# Patient Record
Sex: Female | Born: 1945
Health system: Southern US, Community
[De-identification: ages and names within clinical notes are randomized; demographics above are authoritative.]

## PROBLEM LIST (undated history)

## (undated) DIAGNOSIS — J189 Pneumonia, unspecified organism: Secondary | ICD-10-CM

## (undated) DIAGNOSIS — R011 Cardiac murmur, unspecified: Secondary | ICD-10-CM

## (undated) DIAGNOSIS — K219 Gastro-esophageal reflux disease without esophagitis: Secondary | ICD-10-CM

## (undated) HISTORY — DX: Gastro-esophageal reflux disease without esophagitis: K21.9

## (undated) HISTORY — PX: CATARACT EXTRACTION W/ INTRAOCULAR LENS  IMPLANT, BILATERAL: SHX1307

## (undated) HISTORY — PX: TUBAL LIGATION: SHX77

## (undated) HISTORY — DX: Cardiac murmur, unspecified: R01.1

## (undated) HISTORY — PX: TONSILLECTOMY: SUR1361

---

## 1997-09-16 ENCOUNTER — Inpatient Hospital Stay (HOSPITAL_COMMUNITY): Admission: EM | Admit: 1997-09-16 | Discharge: 1997-09-17 | Payer: Self-pay | Admitting: Emergency Medicine

## 1997-09-22 ENCOUNTER — Encounter: Admission: RE | Admit: 1997-09-22 | Discharge: 1997-09-22 | Payer: Self-pay | Admitting: Family Medicine

## 1997-12-04 ENCOUNTER — Encounter: Admission: RE | Admit: 1997-12-04 | Discharge: 1997-12-04 | Payer: Self-pay | Admitting: Family Medicine

## 1998-01-20 ENCOUNTER — Encounter: Admission: RE | Admit: 1998-01-20 | Discharge: 1998-01-20 | Payer: Self-pay | Admitting: Family Medicine

## 1998-09-07 ENCOUNTER — Encounter: Payer: Self-pay | Admitting: Emergency Medicine

## 1998-09-07 ENCOUNTER — Inpatient Hospital Stay (HOSPITAL_COMMUNITY): Admission: EM | Admit: 1998-09-07 | Discharge: 1998-09-09 | Payer: Self-pay | Admitting: Emergency Medicine

## 1998-09-22 ENCOUNTER — Encounter: Admission: RE | Admit: 1998-09-22 | Discharge: 1998-09-22 | Payer: Self-pay | Admitting: Family Medicine

## 1998-10-22 ENCOUNTER — Ambulatory Visit (HOSPITAL_COMMUNITY): Admission: RE | Admit: 1998-10-22 | Discharge: 1998-10-22 | Payer: Self-pay | Admitting: Sports Medicine

## 1998-11-26 ENCOUNTER — Encounter: Admission: RE | Admit: 1998-11-26 | Discharge: 1998-11-26 | Payer: Self-pay | Admitting: Family Medicine

## 1998-12-03 ENCOUNTER — Encounter: Admission: RE | Admit: 1998-12-03 | Discharge: 1998-12-03 | Payer: Self-pay | Admitting: Family Medicine

## 1998-12-22 ENCOUNTER — Encounter: Admission: RE | Admit: 1998-12-22 | Discharge: 1998-12-22 | Payer: Self-pay | Admitting: Family Medicine

## 1999-01-25 ENCOUNTER — Encounter: Admission: RE | Admit: 1999-01-25 | Discharge: 1999-01-25 | Payer: Self-pay | Admitting: Family Medicine

## 1999-02-10 ENCOUNTER — Encounter: Admission: RE | Admit: 1999-02-10 | Discharge: 1999-02-10 | Payer: Self-pay | Admitting: Family Medicine

## 1999-03-07 ENCOUNTER — Encounter: Admission: RE | Admit: 1999-03-07 | Discharge: 1999-03-07 | Payer: Self-pay | Admitting: Family Medicine

## 1999-03-10 ENCOUNTER — Encounter: Admission: RE | Admit: 1999-03-10 | Discharge: 1999-03-10 | Payer: Self-pay | Admitting: Sports Medicine

## 1999-03-10 ENCOUNTER — Encounter: Payer: Self-pay | Admitting: Sports Medicine

## 1999-04-05 ENCOUNTER — Encounter: Admission: RE | Admit: 1999-04-05 | Discharge: 1999-04-05 | Payer: Self-pay | Admitting: Sports Medicine

## 1999-09-02 ENCOUNTER — Encounter: Admission: RE | Admit: 1999-09-02 | Discharge: 1999-09-02 | Payer: Self-pay | Admitting: Family Medicine

## 1999-09-03 ENCOUNTER — Emergency Department (HOSPITAL_COMMUNITY): Admission: EM | Admit: 1999-09-03 | Discharge: 1999-09-03 | Payer: Self-pay | Admitting: Emergency Medicine

## 1999-09-03 ENCOUNTER — Encounter: Payer: Self-pay | Admitting: Emergency Medicine

## 1999-09-12 ENCOUNTER — Encounter: Admission: RE | Admit: 1999-09-12 | Discharge: 1999-09-12 | Payer: Self-pay | Admitting: Family Medicine

## 2001-02-22 ENCOUNTER — Encounter: Admission: RE | Admit: 2001-02-22 | Discharge: 2001-02-22 | Payer: Self-pay | Admitting: Family Medicine

## 2001-02-22 ENCOUNTER — Inpatient Hospital Stay (HOSPITAL_COMMUNITY): Admission: EM | Admit: 2001-02-22 | Discharge: 2001-02-25 | Payer: Self-pay | Admitting: Emergency Medicine

## 2001-02-22 ENCOUNTER — Encounter: Payer: Self-pay | Admitting: Family Medicine

## 2001-09-12 ENCOUNTER — Emergency Department (HOSPITAL_COMMUNITY): Admission: EM | Admit: 2001-09-12 | Discharge: 2001-09-12 | Payer: Self-pay | Admitting: Emergency Medicine

## 2001-09-12 ENCOUNTER — Encounter: Payer: Self-pay | Admitting: Emergency Medicine

## 2001-09-13 ENCOUNTER — Encounter: Admission: RE | Admit: 2001-09-13 | Discharge: 2001-09-13 | Payer: Self-pay | Admitting: Family Medicine

## 2001-09-19 ENCOUNTER — Encounter: Payer: Self-pay | Admitting: *Deleted

## 2001-09-19 ENCOUNTER — Encounter: Admission: RE | Admit: 2001-09-19 | Discharge: 2001-09-19 | Payer: Self-pay | Admitting: *Deleted

## 2002-04-08 ENCOUNTER — Inpatient Hospital Stay (HOSPITAL_COMMUNITY): Admission: EM | Admit: 2002-04-08 | Discharge: 2002-04-11 | Payer: Self-pay | Admitting: Emergency Medicine

## 2002-04-08 ENCOUNTER — Encounter: Payer: Self-pay | Admitting: Emergency Medicine

## 2002-04-09 ENCOUNTER — Encounter: Payer: Self-pay | Admitting: Family Medicine

## 2002-04-10 ENCOUNTER — Encounter: Payer: Self-pay | Admitting: Family Medicine

## 2002-04-16 ENCOUNTER — Encounter: Admission: RE | Admit: 2002-04-16 | Discharge: 2002-04-16 | Payer: Self-pay | Admitting: Family Medicine

## 2002-11-11 ENCOUNTER — Emergency Department (HOSPITAL_COMMUNITY): Admission: AD | Admit: 2002-11-11 | Discharge: 2002-11-11 | Payer: Self-pay | Admitting: Emergency Medicine

## 2002-12-19 ENCOUNTER — Inpatient Hospital Stay (HOSPITAL_COMMUNITY): Admission: EM | Admit: 2002-12-19 | Discharge: 2002-12-24 | Payer: Self-pay | Admitting: Family Medicine

## 2002-12-29 ENCOUNTER — Encounter: Admission: RE | Admit: 2002-12-29 | Discharge: 2002-12-29 | Payer: Self-pay | Admitting: Family Medicine

## 2003-01-21 ENCOUNTER — Encounter (INDEPENDENT_AMBULATORY_CARE_PROVIDER_SITE_OTHER): Payer: Self-pay | Admitting: *Deleted

## 2003-01-21 LAB — CONVERTED CEMR LAB

## 2003-01-28 ENCOUNTER — Encounter: Admission: RE | Admit: 2003-01-28 | Discharge: 2003-01-28 | Payer: Self-pay | Admitting: Family Medicine

## 2004-03-19 ENCOUNTER — Inpatient Hospital Stay (HOSPITAL_COMMUNITY): Admission: EM | Admit: 2004-03-19 | Discharge: 2004-03-24 | Payer: Self-pay | Admitting: Emergency Medicine

## 2004-08-10 ENCOUNTER — Emergency Department (HOSPITAL_COMMUNITY): Admission: EM | Admit: 2004-08-10 | Discharge: 2004-08-10 | Payer: Self-pay | Admitting: Emergency Medicine

## 2004-09-20 ENCOUNTER — Inpatient Hospital Stay (HOSPITAL_COMMUNITY): Admission: EM | Admit: 2004-09-20 | Discharge: 2004-09-26 | Payer: Self-pay | Admitting: Emergency Medicine

## 2004-09-22 ENCOUNTER — Ambulatory Visit: Payer: Self-pay | Admitting: Pulmonary Disease

## 2004-09-27 ENCOUNTER — Ambulatory Visit: Payer: Self-pay | Admitting: Internal Medicine

## 2004-09-29 ENCOUNTER — Ambulatory Visit: Payer: Self-pay | Admitting: Internal Medicine

## 2004-10-06 ENCOUNTER — Ambulatory Visit: Payer: Self-pay | Admitting: Gastroenterology

## 2004-10-19 ENCOUNTER — Encounter (INDEPENDENT_AMBULATORY_CARE_PROVIDER_SITE_OTHER): Payer: Self-pay | Admitting: *Deleted

## 2004-10-19 ENCOUNTER — Ambulatory Visit: Payer: Self-pay | Admitting: Gastroenterology

## 2006-04-19 DIAGNOSIS — J309 Allergic rhinitis, unspecified: Secondary | ICD-10-CM | POA: Insufficient documentation

## 2006-04-19 DIAGNOSIS — J45909 Unspecified asthma, uncomplicated: Secondary | ICD-10-CM | POA: Insufficient documentation

## 2006-04-20 ENCOUNTER — Encounter (INDEPENDENT_AMBULATORY_CARE_PROVIDER_SITE_OTHER): Payer: Self-pay | Admitting: *Deleted

## 2007-04-02 ENCOUNTER — Inpatient Hospital Stay (HOSPITAL_COMMUNITY): Admission: EM | Admit: 2007-04-02 | Discharge: 2007-04-04 | Payer: Self-pay | Admitting: Emergency Medicine

## 2007-05-30 ENCOUNTER — Ambulatory Visit (HOSPITAL_COMMUNITY): Admission: RE | Admit: 2007-05-30 | Discharge: 2007-05-30 | Payer: Self-pay | Admitting: Ophthalmology

## 2009-06-18 ENCOUNTER — Emergency Department (HOSPITAL_COMMUNITY): Admission: EM | Admit: 2009-06-18 | Discharge: 2009-06-18 | Payer: Self-pay | Admitting: Emergency Medicine

## 2009-10-01 ENCOUNTER — Encounter (INDEPENDENT_AMBULATORY_CARE_PROVIDER_SITE_OTHER): Payer: Self-pay | Admitting: *Deleted

## 2010-03-24 NOTE — Letter (Signed)
Summary: Colonoscopy Letter  [facility]  [address] [place], [place] [address]   Phone: [phone]  Fax: [phone]      October 01, 2009 MRN: [id number]   [facility] [address] [place], [place]  [address]   Dear Ms. DANIELS,   According to your medical record, it is time for you to schedule a Colonoscopy. The American Cancer Society recommends this procedure as a method to detect early colon cancer. Patients with a family history of colon cancer, or a personal history of colon polyps or inflammatory bowel disease are at increased risk.  This letter has beeen generated based on the recommendations made at the time of your procedure. If you feel that in your particular situation this may no longer apply, please contact our office.  Please call our office at [phone] to schedule this appointment or to update your records at your earliest convenience.  Thank you for cooperating with [place] to provide you with the very best care possible.   Sincerely,  Judie Petit T. Russella Dar, M.D.  [facility] [phone]

## 2010-07-05 NOTE — H&P (Signed)
NAME:  [name], [name]             ACCOUNT NO.:  [id number]   MEDICAL RECORD NO.:  [id number]          PATIENT TYPE:  INP   LOCATION:  1826                         FACILITY:  [facility]   PHYSICIAN:  Mobolaji B. Bakare, M.D.DATE OF BIRTH:  04/12/1945   DATE OF ADMISSION:  04/01/2007  DATE OF DISCHARGE:                              HISTORY & PHYSICAL   CHIEF COMPLAINT:  Shortness of breath and cough.   HISTORY OF PRESENT ILLNESS:  Ms. [name] is a 65 year old, African  American female with known history of asthma.  She has been on Advair  and albuterol.  She was in her usual state of health until about 2 days  ago when she started feeling unwell.  It initially started with feeling  of upper respiratory congestion.  She has history of contact with a son  who had flu-like symptoms.  The patient became quite unwell today with  persistent cough and wheezing.  Cough is productive of yellow sputum.  She was brought to the emergency room in severe bronchospasm and was  speaking in short phrases.  She received multiple rounds of nebulizers  and she continues to wheeze.  The patient is now feeling better, no  longer acutely dyspneic.  She had a chest x-ray which revealed probable  left lower lobe pneumonia.  She has received Solu-Medrol, albuterol and  Atrovent nebulizers and magnesium sulfate in the emergency room.  Antibiotics given include Rocephin 1 g IV.  She will be admitted for  further treatment.   REVIEW OF SYSTEMS:  She endorses fever, but no chills.  There is no  chest pain, nausea, vomiting or diarrhea.  She has no constipation,  dysuria or foul-smelling urine.   PAST MEDICAL HISTORY:  1. Status asthmaticus requiring mechanical ventilation in August 2006.  2. History of microcytic anemia.   CURRENT MEDICATIONS:  1. Albuterol nebulizers p.r.n.  2. Advair Diskus 1 puff daily.   ALLERGIES:  ASPIRIN.   SOCIAL HISTORY:  She does not smoke cigarettes or drink alcohol.  She  denies  drug abuse.  She works with the homeless.   FAMILY HISTORY:  Positive for asthma in one of her sisters.  Both  parents are alive.  They have high blood pressure.   PHYSICAL EXAMINATION:  VITAL SIGNS:  Temperature 99.9, blood pressure  166/81, pulse 68, respirations 28, O2 saturations 92%.  GENERAL:  The patient is comfortable and not in acute respiratory  distress.  HEENT:  Normocephalic, atraumatic.  Pupils equal round and reactive to  light.  Mucous membranes moist.  No elevated JVD.  LUNGS:  Bilateral respiratory wheeze with left basilar crackles.  CARDIAC:  S1, S2 regular.  ABDOMEN:  Nondistended, soft, nontender.  Bowel sounds present.  EXTREMITIES:  No pedal edema or calf tenderness.  Dorsalis pedis pulses  palpable bilaterally.  NEUROLOGIC:  No focal neurological deficits.   LABORATORY DATA AND X-RAY FINDINGS:  Blood gas with pH 7.42, pCO2 43,  pO2 76, bicarb 29, O2 saturations 95%.  BNP 40.  Sodium 141, potassium  2.8, chloride 106, CO2 26, glucose 145, BUN 6.  LFTs normal except for  albumin of 3.4.  White cell count 11.1, hemoglobin 11.3, hematocrit  34.8, MCV 78.3, platelets 229.   Chest x-ray shows strictly adherence in left lower lobe concerning for  pneumonia.  Lateral view recommended.   ASSESSMENT/PLAN:  Ms. [name] is a 65 year old, African American female  with a history of asthma presenting with acute onset of shortness of  breath and wheezing, low-grade fever, mild leukocytosis.  Chest x-ray  suggestive of left lower lobe pneumonia.  She has history of sick  contact.   1. Acute asthma exacerbation.  The patient has history of mechanical      ventilation in 2006.  She will be admitted to a step-down unit.      Will continue nebulization with Xopenex q.6 h. and q.3 h. p.r.n.,      Solu-Medrol 60 mg IV q.6 h.  We added Singulair 10 mg p.o. daily.  2. Left lower lobe pneumonia.  Will attempt blood culture and continue      Rocephin 1 g IV daily, Zithromax 500 mg  IV daily.  Will follow up      with lateral view of the chest.  3. Hypokalemia.  Will replete potassium chloride 40 mEq x2 doses and      follow up with BMET.  4. Microcytic anemia.  Mean corpuscular volume is on the low side of      normal at 78.7.  Will check anemia panel and stool Hemoccult.      Mobolaji B. Corky Downs, M.D.  Electronically Signed     MBB/MEDQ  D:  04/02/2007  T:  04/02/2007  Job:  [id number]

## 2010-07-05 NOTE — Discharge Summary (Signed)
[name], BURNSWORTH             ACCOUNT NO.:  [id number]   MEDICAL RECORD NO.:  [id number]          PATIENT TYPE:  INP   LOCATION:  3028                         FACILITY:  [facility]   PHYSICIAN:  Beckey Rutter, MD  DATE OF BIRTH:  Nov 04, 1945   DATE OF ADMISSION:  04/01/2007  DATE OF DISCHARGE:  04/04/2007                               DISCHARGE SUMMARY   CHIEF COMPLAINT/HISTORY OF PRESENT ILLNESS:  This 65 year old pleasant  African American female admitted for shortness of breath.   HOSPITAL COURSE:  #1 - ASTHMA.  The patient was continued on nebulizer  medication and steroid.   #2 - LEFT LOWER LOBE PNEUMONIA.  This was treated with Zithromax and  Rocephin with good response.   #3 - HYPOKALEMIA, REPLETED.  The patient's potassium now is stable.   #4 - MICROCYTIC ANEMIA WITH UNYIELDING ANEMIA WORKUP.   POSTURAL PROCEDURES:  Chest x-ray on April 01, 2007, impression is  increased streaking lower lobe opacities when compared to prior exams,  left greater than right.  Left lower lobe bronchopneumonia cannot be  excluded.  Lateral view might be helpful for further evaluation.   DISCHARGE LABORATORIES:  Sodium 141, potassium 4.4, chloride 105, CO2  30, glucose 121, BUN is 14, creatinine 0.80, white blood count is 10.0,  hemoglobin is 10.7, hematocrit 32.7, platelet count is 238, T3 is 2.1,  T4 is 0.99.  TSH is 0.12.   DISCHARGE DIAGNOSES:  1. Left lower lobe pneumonia.  2. Bronchial asthma with acute exacerbation.  3. Iron deficiency anemia.   DISCHARGE MEDICATIONS:  1. NuIron.  2. Advair 250/50 one puff b.i.d. three.  3. Xopenex t.i.d. p.r.n.  4. Tapering dose of steroid prednisone.  5. Avelox 400 mg p.o. daily.   DISCHARGE/PLAN:  The patient will be discharged to follow up with her  primary physician within a week.  Her primary physician is Dr. Yetta Barre.  I  am not sure if he has a fax number on the record, but the patient is  also aware of the need to follow-up her  thyroid function test as well as  the anemia workup.  The patient agreed to follow up with a primary  within a week.      Beckey Rutter, MD  Electronically Signed     EME/MEDQ  D:  04/04/2007  T:  04/05/2007  Job:  [id number]

## 2010-07-08 NOTE — H&P (Signed)
NAME:  [name], [name]                       ACCOUNT NO.:  [id number]   MEDICAL RECORD NO.:  [id number]                   PATIENT TYPE:  INP   LOCATION:  1824                                 FACILITY:  [facility]   PHYSICIAN:  Santiago Bumpers. Hensel, M.D.             DATE OF BIRTH:  Jul 12, 1945   DATE OF ADMISSION:  12/19/2002  DATE OF DISCHARGE:                                HISTORY & PHYSICAL   PRIMARY CARE PHYSICIAN:  Georgina Peer, M.D.   CHIEF COMPLAINT:  Asthma exacerbation.   HISTORY OF PRESENT ILLNESS:  [name] is a 65 year old African-American  female patient who has had a history of two previous intubations secondary  to severe asthma.  She apparently started having some asthma exacerbation  symptoms yesterday, but refused transport to the doctor's office at that  time.  At some point in the early morning this morning her son recognized  that she was having difficulty and went to her house and called EMS.  Upon  their arrival she was tripod sitting, short of breath, and unable to say  complete sentences.  She was transported by EMS and en route was given one  albuterol and Atrovent nebulizer and became apneic at which time EMS  intubated.  Upon arrival in the ER patient was sedated with a Versed drip.  She did have an episode of hypotension and was given a 600 mL bolus and  placed in Trendelenburg position with a subsequent resumption of  normotension at 109/47.  She also was given a 2 g magnesium bolus over 45  minutes and Decadron 20 mg IV.  History is somewhat poorly obtained as the  patient is not accompanied by her son to the ER.  We do have report that she  denies sick contacts or URI symptoms.   PAST MEDICAL HISTORY:  1. Allergic rhinitis.  2. Asthma.  3. HRT.  4. Intubation secondary to VDRF in January 1995 and 2004.  5. Exercise tolerance testing in September 2000 shows indeterminate risk.   MEDICATIONS:  1. Advair Diskus one puff b.i.d.  2. Albuterol  and Atrovent MDIs per routine.  3. Singulair 10 mg p.o. q.h.s.   There is concern that patient may be noncompliant with her medication  therapy.   ALLERGIES:  ASPIRIN.   SOCIAL HISTORY:  The patient may live with a young son, although we do know  that she has at least one son who is an adult.  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  Denies alcohol, tobacco, or drugs.   FAMILY HISTORY:  No history of breast cancer, CAD, CVAs.  She does have a  mother with hypertension and a sister with asthma.   PHYSICAL EXAMINATION:  VITAL SIGNS:  Afebrile.  Pulse 110, blood pressure  121/58.  GENERAL:  Intubated and sedated.  HEENT:  PERRLA, but sluggish response as patient is on Versed.  NECK:  Supple.  No lymphadenopathy.  CARDIOVASCULAR:  Tachycardia.  No murmur.  Regular rhythm.  LUNGS:  Prolonged end-expiratory phase with wheeze and fairly tight air  movement.  ABDOMEN:  Soft, nontender.  SKIN:  No rashes or lesions noted.  NEUROLOGIC:  Unable due to sedation.   LABORATORIES:  WBC 11.1, hemoglobin 12.5, platelets 256,000.  Initial ABG pH  7.1, pCO2 92, pO2 520, bicarbonate 29 100%.  Respiratory rate was turned up  to 16, FiO2 turned down to 50% with a subsequent ABG of pH 7.2, pCO2 69, pO2  219, saturations 50%.  Sodium 142, potassium 3.5, chloride 106, CO2 32, BUN  7, glucose 169.  Chest x-ray shows no infiltrate or effusion, but  significant hyperinflation.   ASSESSMENT/PLAN:  A 65 year old female with ventilator dependent respiratory  failure secondary to acute asthma exacerbation.  1. Pulmonary.  Continue continuous albuterol nebulizers at this time and     q.6h. Atrovent nebulizers and begin Solu-Medrol 120 mg intravenous q.6h.     We will consult CCM for that management.  2. Infectious disease:  No evidence of infection on chest x-ray and patient     is afebrile.  We will hold antibiotics at this time.  3. FEN:  Decreased urine output and patient appears to be dehydrated.  Will      give another 500 mL bolus and start her on D5 half normal saline with 20     KCl per liter at 100 mL/hour.  4. Gastrointestinal:  Start patient on gastrointestinal prophylaxis with     Protonix 40 mg intravenous daily.  5. Deep venous thrombosis prophylaxis with Lovenox as per pharmacy.  6. Neurologic.  Continue Versed sedation protocol as initiated in the     emergency room.  7. Respiratory acidosis.  CO2 is trending down after increased respiratory     rate.  Continue current ventilator settings at this time and defer     management further to the intensive care unit team.  Will check an ABG     and XR in the a.m.      Jonah Blue, M.D.                      William A. Leveda Anna, M.D.    Milas Gain  D:  12/19/2002  T:  12/19/2002  Job:  [id number]   cc:   Georgina Peer, M.D.  [address] [place], [place] [address]  Fax: [phone]

## 2010-07-08 NOTE — H&P (Signed)
NAMERYLAND, SMOOTS NO.:  [id number]   MEDICAL RECORD NO.:  [id number]          PATIENT TYPE:  INP   LOCATION:  2109                         FACILITY:  [facility]   PHYSICIAN:  Sherin Quarry, MD      DATE OF BIRTH:  Apr 29, 1945   DATE OF ADMISSION:  09/20/2004  DATE OF DISCHARGE:                                HISTORY & PHYSICAL   [name] is a 65 year old lady who apparently developed acute onset  of wheezing tonight. EMS was summoned. In the EMS vehicle she was given  nebulizer treatments with Alupent and also intravenous steroids. On arrival  to the emergency room she was given SQ epinephrine but it quickly became  apparent that she was experiencing profound respiratory failure. She was  therefore intubated. Dr. Sherene Sires was consulted and the patient was given 2  grams of magnesium sulfate as well as Xopenex 1.25 mg per nebulization.  Subsequently Dr. Sherene Sires has written ventilator orders for the patient and  requested that she be admitted to the Hospitalist service.  [name]  cannot give any additional history at this time and no family members are  present. Therefore no other information can be obtained. On the basis of her  hospital records, past medical history is remarkable for:  Seasonal rhinitis. Chronic bronchial asthma which has required intubation in  1995 and 2000.  According to the records the patient's past medications have included:  1.  Advair Diskus 1 puff b.i.d.  2.  Albuterol by nebulization.  3.  Singulair 10 mg h.s.  (Although I do not know if she is actually taking      these medications at this time).  According to the family practice residents she was last seen in their clinic  2 years ago.   ALLERGIES:  ASPIRIN.   FAMILY HISTORY:  Cannot be obtained.   SOCIAL HISTORY:  The patient is reportedly a non smoker. She does not abuse  drugs. It is indicated in the chart that she has a sister who has a history  of asthma.   REVIEW OF  SYSTEMS:  Cannot be obtained.   PHYSICAL EXAMINATION:  HEENT EXAM:  Is within normal limits. Pupils are  equal and reactive.  CHEST:  Reveals poor air movement, there is expiratory wheezing. The patient  is being ventilated mechanically with a respirator.  CARDIOVASCULAR EXAM:  Reveals normal S1, S2 without rubs, murmurs, gallops.  ABDOMEN:  Is somewhat distended. It is nontender, there is no guarding or  rebound.  NEUROLOGIC TESTING:  Examination of extremities is normal.   IMPRESSION:  1.  Status asthmaticus with respiratory failure.  2.  History of asthma with history of several intubations in the past.   PLAN:  The patient has been intubated and placed on a ventilator with orders  given by Dr. Sherene Sires for continued intravenous Solu-Medrol, nebulization with  Xopenex and Atrovent, and sedation protocol. I will place her on empiric  antibiotics as well as IV fluids. Will obtain CBC, CMET, and cardiac enzymes  and follow her closely.       SY/MEDQ  D:  09/20/2004  T:  09/20/2004  Job:  [id number]   cc:   Charlaine Dalton. Sherene Sires, M.D. [facility]

## 2010-07-08 NOTE — Discharge Summary (Signed)
NAMEELVY, MCLARTY             ACCOUNT NO.:  [id number]   MEDICAL RECORD NO.:  [id number]          PATIENT TYPE:  INP   LOCATION:  6740                         FACILITY:  [facility]   PHYSICIAN:  Ileana Roup, M.D.  DATE OF BIRTH:  05/25/1945   DATE OF ADMISSION:  09/20/2004  DATE OF DISCHARGE:  09/26/2004                                 DISCHARGE SUMMARY   DISCHARGE DIAGNOSES:  1.  Asthma status post status asthmaticus, status post ventilator-dependent      respiratory failure.  2.  Microcytic anemia with fecal occult blood positive stools.  3.  Leukocytosis.  4.  Hypoglycemia.  5.  Poor access to healthcare.   DISCHARGE MEDICATIONS INCLUDE:  1.  Advair Diskus 250/50 one puff b.i.d.  2.  Albuterol inhaler 1-2 puffs q.4 hours p.r.n. wheezing.  3.  Avelox 400 mg one tab p.o. daily x3 days.  4.  Prednisone 60 mg p.o. daily.  5.  Atrovent MDI 2-3 puffs t.i.d.   PROCEDURES AND STUDIES:  Chest x-ray on September 20, 2004, shows chronic  obstructive lung disease with an endotracheal tube in place.  No infiltrates  or effusions.  Clear lungs.  A chest x-ray on September 21, 2004, shows less  hyperinflation of these lungs.  Chest x-ray on September 23, 2004, shows new  small patchy infiltrates at the right base.  Chest x-ray on September 25, 2004,  shows bronchitic changes with mild patchy bibasilar changes and a small left  pleural effusion.   CONSULTATIONS:  Dr. Sherene Sires, of Critical Care Medicine.   BRIEF ADMITTING HISTORY AND PHYSICAL:  [name] is a 65 year old African-  American lady with a past medical history of asthma who developed acute  onset of wheezing on the day of admission.  EMS was called.  She was given  nebulizer treatments and IV steroids.  However, on arrival to the emergency  room it became clear that she was in profound respiratory failure.  She was  intubated and Dr. Sherene Sires was consulted.  She was placed on the ventilator and  [name] was not able to give any additional  history at the time of the  admission H&P.   ADMISSION LABS:  Are significant for a CBC with a white blood cell count of  13.2, a hemoglobin of 13.1, a hematocrit of 41.2, platelets 326, %  neutrophils 37%, ANC 4.9.  A complete metabolic panel with a sodium of 141,  potassium 4.1, chloride 108, bicarb 30, BUN 5, creatinine 0.8, glucose 146,  t.bili 0.7, alk phos 138, AST 23, ALT 12, total protein 7.4, albumin 4.2,  calcium 9.2.  Point of care markers were negative and a STAT blood gas  showed a pH of 7.267, pCO2 65.5, pO2 563, bicarb 29.8, base excess 1, oxygen  saturations 100%.  Ferritin was 44, iron 16, total iron binding capacity  246, % saturation 7%.   HOSPITAL COURSE BY PROBLEM:  1.  Status asthmaticus.  The patient came in with a history of asthma and      acute onset respiratory wheezing, was intubated for a short course and  tolerated extubation well.  She was given IV Solu-Medrol as well as      albuterol and Atrovent nebs while on the vent.  After she was extubated,      she was switched over to oral steroids on a dose of 60 mg of prednisone      daily.  She was also started on Avelox empirically and chest x-ray at      the time of admission showed severe hyperinflation.  Followup chest x-      ray showed bronchitic changes and bibasilar atelectasis with small      bilateral effusions.  Her oxygen saturations were monitored during the      course of the hospitalization.  She was continued on albuterol and      Atrovent nebs.  Towards the end of her hospitalization she was started      on Advair MDI 250/50, Atrovent MDI and albuterol MDI p.r.n.  She was      changed, her nebulizers over to p.r.n., and by the time of discharge was      not requiring her p.r.n. nebulizers, her oxygen saturations remained in      the high 90s on room air and her lung exam was clear of wheezes.  She      was discharged with the Advair Diskus and prednisone in hand.  She has      albuterol  inhalers at home and she was written for a prescription for      the Atrovent MDI and the Avelox, to complete a 10 day total course.  2.  Microcytic anemia.  The patient came in with a low hemoglobin and iron      studies suggesting an iron deficiency anemia.  She was also with guaiac      positive stools during this admission and will need to be referred to GI      for a colonoscopy workup as an outpatient.  Hemoglobin was stable at the      time of discharge.  3.  Leukocytosis.  The patient came in with an elevated white blood cell      count.  This was thought to be likely secondary to IV Solu-Medrol vs. an      infectious process.  Chest x-ray initially was not consistent with any      infection; however, she was placed on Avelox empirically.  By the time      of discharge her white blood cell count had come down significantly and      was 9.4.  4.  Hypoglycemia.  The patient's blood sugars were slightly elevated during      the hospitalization.  This was felt to be due to the IV steroids as the      patient had no prior history of diabetes.  We will re-evaluate her      fasting blood sugars as an outpatient once she is off of her steroids.      She was covered with sliding scale insulin while in the hospital.  5.  Poor access to healthcare.  The patient reports that she is currently      uninsured, she has no primary care physician and only has albuterol      inhaler at home for her management of her asthma.  She has been      intubated 3 times over the last 6 months for status asthmaticus and has,      again, no home regimen  except for p.r.n. albuterol.  The patient will      followup with our outpatient clinic for continuity and will be seen by      __________ Loleta Chance, our financial person, to see if she qualifies for a      reduce fee, as well as [employer], who felt that the patient would      benefit dramatically from home regimen for her asthma as well as access      to  regular healthcare.      Do   DC/MEDQ  D:  10/02/2004  T:  10/02/2004  Job:  [id number]   cc:   Outpatient Clinic   Sherin Quarry, MD   Charlaine Dalton. Sherene Sires, M.D. [facility]  [address]  [place]  [place] [address]

## 2010-07-08 NOTE — Discharge Summary (Signed)
NAME:  [name], [name]                       ACCOUNT NO.:  [id number]   MEDICAL RECORD NO.:  [id number]                   PATIENT TYPE:  INP   LOCATION:  5729                                 FACILITY:  [facility]   PHYSICIAN:  Santiago Bumpers. Hensel, M.D.             DATE OF BIRTH:  07-05-1945   DATE OF ADMISSION:  12/19/2002  DATE OF DISCHARGE:  12/24/2002                                 DISCHARGE SUMMARY   DISCHARGE DIAGNOSIS:  Status asthmaticus, resolved.   DISCHARGE MEDICATIONS:  1. Prednisone 60 mg p.o. in the morning for three days, then 40 mg p.o. in     the morning for three days, then 20 mg p.o. in the morning for three     days, then discontinue.  2. Advair 500/50 Diskus one spray b.i.d.  3. Singulair 10 mg p.o. once a day.  4. Albuterol 2.5 mg nebulizer as necessary.   DISCHARGE INSTRUCTIONS:  Follow up with Georgina Peer, M.D. at [facility] on November 8, at 8:30 a.m.   HISTORY OF PRESENT ILLNESS:  This is a 65 year old African-American female  with history of two previous intubations secondary to severe asthma, who was  found by EMS with shortness of breath and sitting in a tripod position.  EMS  gave one albuterol nebulization and Atrovent nebulization. The patient then  appeared apneic.  EMS gave etomidate and intubated the patient.  On arrival  at [facility] ER, the patient was sedated with a Versed drip.  The  patient had one episode of hypotension and was given 600 mL of IV fluids.  The patient was given 2 grams magnesium and 20 mg Decadron at the ED and  then transferred to the MICU.  Please refer to the history and physical for  past medical history, personal social history, and medication history.   PHYSICAL EXAMINATION:  VITAL SIGNS:  Blood pressure 121/58, heart rate 110,  respiratory rate 16 on ventilator at [facility] of 40% FIO2, temperature 97.2.  GENERAL:  The patient was sedated, intubated.  HEENT:  PERRLA.  NECK:  No  lymphadenopathy.  No thyroid enlargement.  LUNGS:  Prolonged end expiratory wheezes with coarse breath sounds  bilaterally.  Decreased air movement of all the lung fields.  HEART:  Tachycardic.  Good S1 and S2.  No murmurs.  ABDOMEN:  Positive bowel sounds.  No masses palpated.  NEUROLOGY:  Difficult to assess.   LABORATORY DATA:  Hemoglobin 12.5, hematocrit 39.4, WBC 11.1, platelets 256.  Sodium 142, potassium 3.5, chloride 106, CO2 32, BUN 7, glucose 169.  ABG  showed pH of 7.101, pCO2 91.6, pO2 520, bicarb 29, and O2 saturation of  100%.  Chest x-ray showed no infiltrates, no effusion, there was note of  hyperinflation.   HOSPITAL COURSE:  Problem 1.  Ventilator-dependent respiratory failure  secondary to status asthmaticus.  The patient was maintained on ventilator  for three days.  She  was started on Solu-Medrol 120 mg IV every six hours,  Atrovent nebulization every six hours, and albuterol nebulization every six  hours.  Singulair 10 mg b.i.d. was also given by via the tube.  The patient  was started on Avelox 400 mg IV once a day because of a presumed pneumonia.  However, on repeat x-ray, there was no note of an infiltrate.  Thus, Avelox  was discontinued.  The patient tolerated extubation well with note of  improvement of her respiratory symptoms.  IV steroids were then shifted to  prednisone p.o. in a tapering schedule.  The patient received asthma  education from our pharmacy student.  She was advised to be compliant with  her medications to prevent another episode of status asthmaticus.  Referral  to social work was made so as to assist the patient with her asthma  medications.   Problem 2.  Hyperglycemia probably secondary to steroids.  CBG __________  was done. The patient was started on Lantus 5 units subcu h.s.  Prior to  discharge, Lantus was discontinued and her CBG's were all within normal  range.   CONDITION ON DISCHARGE:  The patient was discharged with stable  vital signs  with marked improvement in her breathing.  There was no dyspnea, no  shortness of breath, just occasional wheezes on physical examination.  The  patient was advised to follow up with Dr. Dahlia Byes on November 8, at 8:30  a.m. and to be compliant with all her asthma medications.  Prior to  discharge, social work was able to procure asthma medications for the  patient.      Lawerance Sabal, MD                         Santiago Bumpers. Leveda Anna, M.D.    MC/MEDQ  D:  12/27/2002  T:  12/28/2002  Job:  [id number]   cc:   Georgina Peer, M.D.  [address] [place], [place] [address]  Fax: [phone]

## 2010-07-08 NOTE — H&P (Signed)
[name]. [facility]  Patient:    [name], [name] Visit Number: [id number] MRN: [id number]          Service Type: MED Location: CCUB 2912 [id number] Attending Physician:  McDiarmid, Leighton Roach. Dictated by:   Gwenlyn Perking, M.D. Admit Date:  02/22/2001   CC:         Solon Palm, M.D.   History and Physical  PRIMARY CARE PHYSICIAN:  Dr. Solon Palm.  CHIEF COMPLAINT:  Wheezing.  HISTORY OF PRESENT ILLNESS:  The patient is a 65 year old African-American female who presented this morning to the walk-in clinic at the [facility].  The patient had started to feel "tight."  She had a bronchitis and cough about a week ago.  The patient states that this initially started to clear up; however, after the holidays she started again to feel tight.  This tightness and wheezing were initially alleviated by albuterol nebulizer treatments at home.  However, when she woke up this morning she felt a tightness and shortness of breath that was not alleviated with the albuterol.  At that point she decided to come to the walk-in clinic for further evaluation and treatment.  At the walk-in clinic the patient received two nebulizer treatments with albuterol and Atrovent.  However, this only offered minimal relief to her, and she continued to feel very tight.  In addition, she had poor air movement, according to the examining physician, Dr. Oda Cogan, at the walk-in clinic.  The patient also received a Solu-Medrol injection at the clinic.  The patient subsequently had a near-syncopal episode associated with diaphoresis, nausea after she received a shot.  The patient tells me that the shot was particularly painful.  The patient has never had a syncopal or near-syncopal episode before.  REVIEW OF SYSTEMS:  CONSTITUTIONAL:  No history of fever or chills. CARDIOVASCULAR:  No chest pain.  RESPIRATORY:  See history of present illness. GASTROINTESTINAL:   The patient has one bowel movement per day.  Denies any history of hematochezia or melena.  SKIN:  No rashes.  NEUROLOGIC:  See history of present illness.  No residual weakness.  No loss of sensation. PSYCHIATRIC:  No history of depression.  MUSCULOSKELETAL:  No joint pain. EYES:  No eyes complaints.  ENDOCRINE:  No history of diabetes.  ENT: Positive nasal congestion for the last week.  GENITOURINARY:  No urinary complaints.  HEMATOLOGIC:  No history of anemia.  PAST MEDICAL HISTORY: 1. Allergic rhinitis. 2. Severe asthma.  CURRENT MEDICATIONS: 1. Albuterol nebulizer treatments q.4h. p.r.n. 2. Advair 1 inhalation twice a day. 3. Albuterol MDI 2 puffs q.4h. p.r.n.  ALLERGIES:  ASPIRIN.  SOCIAL HISTORY:  The patient lives with her 57 year old son.  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  No tobacco, alcohol, or drug abuse.  PAST SURGICAL HISTORY: 1. Cervical polyp removal in July 1998. 2. Intubation secondary to respiratory failure in January 1995. 3. Exercise treadmill test in September 2000.  FAMILY HISTORY:  No history of breast cancer, coronary artery disease, or stroke.  Mother had a history of hypertension.  PHYSICAL EXAMINATION:  VITAL SIGNS:  Stable, afebrile.  GENERAL:  Well-developed, well-nourished 65 year old African-American female, awake and alert, in mild distress.  The patient is audibly wheezing.  HEENT:  Normocephalic, atraumatic.  Pupils are equal, round, and reactive to light and accommodation.  Extraocular muscles intact.  Conjunctivae, lids without lesions.  The sclerae are muddy.  The ears and nose are grossly within normal limits.  The  oropharynx reveals dentition to be in good repair. Posterior oropharynx is benign.  NECK:  No lymphadenopathy, no thyromegaly.  Bruits cannot be appreciated secondary to noisy breath sounds.  CARDIOVASCULAR:  Distant heart sounds.  Regular rate and rhythm.  No murmurs are appreciated.  LUNGS:  Diffuse squeaks and pops  and wheezes throughout all lung fields.  No egophony.  No whisper pectoriloquy.  ABDOMEN:  Soft, nontender, nondistended.  Normoactive bowel sounds.  EXTREMITIES:  No clubbing, cyanosis, or edema.  NEUROLOGIC:  Cranial nerves II-XII grossly intact.  No focal sensory or motor deficits.  No ataxia.  LABORATORY DATA:  A BMET and CBC are still pending at the time of this dictation.  A chest x-ray is still pending at the time of this dictation.  EKG reveals normal sinus rhythm, rate about 100, nonspecific ST changes.  ASSESSMENT AND PLAN: 1. Asthma exacerbation:  Patient with history of severe asthma, status post    intubation in 1995.  Upper respiratory infection was likely the trigger for    this exacerbation.  Will admit the patient for monitoring with continuous    pulse oximetry.  Will treat the patient symptomatically with bronchodilator    therapy (albuterol nebulizers).  Will also provide oxygen to keep her    O2 saturations above 90.  Will also continue steroids.  Prednisone 60 mg    p.o. q.d.  Will start that in the morning.  Will also continue her other    asthma medications from home. 2. Near-syncopal episode:  This is likely vasovagal, given the history the    patient gives [place] with a near-syncopal episode close to a painful shot.    Given the patients cardiovascular history with positive troponin I and    EKG changes in the July 2000, will put the patient on telemetry and monitor    for any arrhythmias or other changes.  This patient was discussed with attending, Dr. Deirdre Priest. Dictated by:   Gwenlyn Perking, M.D. Attending Physician:  McDiarmid, Tawanna Cooler D. DD:  02/22/01 TD:  02/22/01 Job: [id number]

## 2010-07-08 NOTE — H&P (Signed)
[name], [name]             ACCOUNT NO.:  [id number]   MEDICAL RECORD NO.:  [id number]          PATIENT TYPE:  INP   LOCATION:  1828                         FACILITY:  [facility]   PHYSICIAN:  Corinna L. Lendell Caprice, MDDATE OF BIRTH:  1946-01-04   DATE OF ADMISSION:  08/10/2004  DATE OF DISCHARGE:                                HISTORY & PHYSICAL   CHIEF COMPLAINT:  Wheezing.   HISTORY OF PRESENT ILLNESS:  [name] is a pleasant 65 year old  unassigned black female with asthma who presents with an acute exacerbation.  She reports that the wheezing and shortness of breath started suddenly at 10  p.m. She has no cough. She has had no fevers or chills. She has had some  sinus drainage but no sinus pain or pressure. She has received several  nebulizer treatments as well as methylprednisolone and Terbutaline  injection. She is somewhat improved but still wheezing quite a bit.   PAST MEDICAL HISTORY:  Asthma requiring intubation in the past.   SOCIAL HISTORY:  The patient does not smoke, drink or use drugs.   FAMILY HISTORY:  Noncontributory.   REVIEW OF SYSTEMS:  As above, otherwise negative.   MEDICATIONS:  Albuterol, Advair and Singulair.   PHYSICAL EXAMINATION:  VITAL SIGNS:  Her temperature is not recorded. Blood  pressure 153/96, pulse 110, respiratory rate initially 38, current it is 24,  oxygen saturation initially was 82% on room air, currently 96% on 2 liters.  GENERAL:  The patient is well-developed, well-nourished, in mild to moderate  respiratory distress. She is able to speak in short sentences.  HEENT:  Normocephalic, atraumatic. Pupils are equal, round and reactive to  light. Sclerae are non icteric. Moist mucous membranes. Oropharynx is  without erythema or exudate. Tympanic membranes are without any signs of  infection.  NECK:  Supple, no lymphadenopathy, no thyromegaly, no JVD.  LUNGS:  She has diffuse inspiratory and expiratory wheezing, no rales or   rhonchi.  CARDIOVASCULAR:  Regular, fast, no murmurs, gallops, rubs.  ABDOMEN:  Soft, nontender, nondistended.  GU/RECTAL:  Deferred.  EXTREMITIES:  No clubbing, cyanosis or edema.  NEUROLOGIC:  Alert and oriented. Cranial nerves and sensory motor exam were  intact.  PSYCHIATRIC:  Normal affect.  SKIN:  No rash.   LABORATORY DATA:  Venous pH is 7.343, venous PCO2 is 50.9, bicarbonate is  27. Hemoglobin 16, hematocrit 47. Sodium 141, potassium 3.3, chloride 107,  glucose 113, BUN 11, creatinine 0.8.   Chest x-ray shows no infiltrate.   ASSESSMENT/PLAN:  1.  Status asthmaticus. As patient is still wheezing quite a bit I will      recommend admission to telemetry for oxygen, continued nebulizer      treatments, steroids, and monitoring. She has a history of steroid      induced hyperglycemia and I will monitor Accu-Checks. She will also get      deep venous thrombosis prophylaxis.   1.  Hypokalemia. This will be repleted.       CLS/MEDQ  D:  08/10/2004  T:  08/10/2004  Job:  [id number]

## 2010-07-08 NOTE — Discharge Summary (Signed)
NAME:  [name], [name]                       ACCOUNT NO.:  [id number]   MEDICAL RECORD NO.:  [id number]                   PATIENT TYPE:  INP   LOCATION:  5731                                 FACILITY:  [facility]   PHYSICIAN:  Santiago Bumpers. Hensel, M.D.             DATE OF BIRTH:  1945/08/07   DATE OF ADMISSION:  04/08/2002  DATE OF DISCHARGE:  04/11/2002                                 DISCHARGE SUMMARY   CONSULTATIONS:  Pulmonary critical care.   PROCEDURE:  Intubation and extubation.   DISCHARGE DIAGNOSES:  1. Ventilator-dependent respiratory failure.  2. Status asthmaticus.  3. Urinary tract infection.  4. Allergic rhinitis.  5. Hyponatremia.   DISCHARGE MEDICATIONS:  1. Bactrim double strength one tablet twice per day x4 days.  2. Prednisone taper 20 mg tablets, four tablets each day for three days and     then 20 mg each day for three days, and then 10 mg one tablet each day     for three days, then half table each day for three days.  3. Advair Diskus 550 one puff b.i.d.  4. Singulair 10 mg one p.o. q.h.s.  5. Albuterol MDI two puffs q.4-6h as needed for shortness of breath.   ACTIVITY:  No restriction, as tolerated.   DIET:  None.   WOUND CARE:  None.   DISCHARGE INSTRUCTIONS:  Will return to [facility] for any high  fevers, shortness of breath, or any other concerns.  The patient is to  follow up with Alvira Philips, M.D. at the [facility] on  Wednesday, February 25, at 3:45 p.m.  The patient is to follow up with Danice Goltz, M.D. [facility] of pulmonology. She is to call our office if she wishes  to set up a follow-up appointment for her pulmonary critical care at [phone].   HISTORY OF PRESENT ILLNESS:  This is a 65 year old African-American female  with known asthma who presented in status asthmaticus in respiratory  distress via EMS.  EMS unable to intubate and bagged the patient until  arrival at the ED.  ER doctor intubated the patient  who remained with severe  bronchospasms and increased peak airway pressures.  She was able to  communicate via EMS and ambulance on route to [facility].   FAMILY HISTORY:  No other further history could be obtained.  No witnesses.  No family available.   LABORATORY DATA/INTUBATION:  PACO2 initially was 78, pH of 7.159.  The  patient was maintained with saturations of 98% while bagged before  intubation.  The patient received paralysis prior to intubation.   PAST MEDICAL HISTORY:  Significant for known asthma with prior intubation in  1995.  Last admission for exacerbation was January of 2003.  Allergic  rhinitis.  The patient is supposedly maintained on Advair, albuterol,  Claritin, and Singulair.   ALLERGIES:  ASPIRIN.   HOSPITAL COURSE:  Problem 1.  Ventilator-dependent respiratory failure.  The  patient was intubated in the ER and maintained on intubation for one  evening.  The patient's ABGs improved slightly with intubation and the  patient initially had a pH of 7.159 and a PCO2 of 78.3.  The patient  improved the next day with PRBC intubation and an FIO2 of 50%.  The  patient's pH improved to 7.319 with a PCO2 of 51.4, bicarb 26, and pO2 104.  The patient was extubated by weaning protocol and transferred to the step-  down unit.  The patient still continued to complain of some shortness of  breath and wheezing.  The patient was also treated with IV steroids as well  as nebulizer treatments before being switched over to p.o. prednisone and  p.o. MDI's prior to discharge.  The patient was instructed on necessity of  medication compliance as a likely trigger for her asthma exacerbation versus  sinusitis or chronic allergic rhinitis.  The patient was instructed on how  to appropriately perform peak flows and was noted to be 260 prior to  treatment and 275 after treatment upon discharge.  The patient was not  oxygen-requiring at the time of discharge and was discharged on  above  medicines as noted above. The patient will follow closely with Alvira Philips, M.D. in the [facility] for medicine management  and primary care doctor will take care of her after the initial visit.  The  patient will call Dr. Jayme Cloud to set up an appointment for further  pulmonology follow-up as the patient is a high risk for sudden fatal death  from status asthmaticus.  The patient understands the severity of her  disease and necessity for medicine compliance.   Problem 2.  Allergic rhinitis.  The patient had increased blood count and  was started on IV steroids for her intubation and her allergic rhinitis.  For her intubation and asthmatic attack.  The patient would need a check on  IgG and RAFT at some point in time as an outpatient.  The patient likely has  severe allergic reactions to some source that set her off in this attack.  IgG is pending at the time of discharge.   Problem 3.  Hyponatremia.  The patient's initial sodium of 128 which was  likely felt to be secondary to SIADH secondary to hyperinflation and  hypercarbia.  The patient was started on IV fluids and gently rehydrated  with a sodium that corrected to 137 before being discharged from the  hospital.   Problem 4.  UTI. The patient has very cloudy and dark-looking urine at the  time of presentation and was started on Cipro empirically.  The patient had  no leukocyte esterase, no nitrites on UA and urine culture was negative.  The patient secondary to appearance of UTI and positive dysuria was finished  up on a full treatment of five days of Bactrim on discharge.  The patient  will follow up as noted above and the patient upon discharge was noted to  have some expiratory wheezes, but was significantly improved and stable on  discharge and will follow up as noted above.     Alvira Philips, M.D.                      William A. Leveda Anna, M.D.   RM/MEDQ  D:  05/14/2002  T:  05/16/2002  Job:   [id number]

## 2010-07-08 NOTE — Discharge Summary (Signed)
[name], [name]             ACCOUNT NO.:  [id number]   MEDICAL RECORD NO.:  [id number]          PATIENT TYPE:  INP   LOCATION:  0349                         FACILITY:  [facility]   PHYSICIAN:  Elliot Cousin, M.D.    DATE OF BIRTH:  02-28-1945   DATE OF ADMISSION:  03/19/2004  DATE OF DISCHARGE:  03/24/2004                                 DISCHARGE SUMMARY   DISCHARGE DIAGNOSES:  1.  Acute asthmatic bronchitis exacerbation.  2.  Status post intubation/ventilation in 1995 and 2000 for respiratory      failure.  3.  Hyperglycemia secondary to steroids.  4.  Leukocytosis.  5.  Gross hematuria.  6.  Seasonal allergies.   DISCHARGE MEDICATIONS:  1.  Avelox 400 mg daily x3 more days.  2.  Prednisone dose taper, 10 mg x12 day course, take as directed.  3.  Advair 250 to 500/50 mg 1 inhalation b.i.d.  4.  Albuterol nebulizer or MDI q.i.d. and every 2 hours as needed.  5.  Singulair 10 mg 1 tablet at bedtime.  6.  Claritin 10 mg 1 tablet daily.  7.  Pepcid 10 mg b.i.d.  8.  Tussionex cough syrup 1 teaspoon every 12 hours as needed.  9.  Tessalon Perles 100 mg b.i.d.   DISPOSITION:  The patient was discharged to home in improved and stable  condition on March 24, 2004.  She has a hospital follow-up appointment  with urologist, Dr. Vonita Moss, on February 16th at 2:30 p.m.  The patient  will also apply for [facility] in 2-3 days.   HISTORY OF PRESENT ILLNESS:  Patient is a 65 year old lady with a past  medical history significant for asthma, who presented to the emergency  department on March 19, 2004 with a chief complaint of cough, shortness of  breath, and wheezing x3 days.  On March 18, 2004, she went to the urgent  care center and was prescribed Biaxin as well as Tylenol Cold W/Codeine  medication for her symptoms; however, her symptoms did not subside, and she  presented to the emergency department.  When she arrived to the emergency  department, she was  afebrile.  Her heart rate was 116, respiratory rate was  30, and blood pressure was 161/96.  She was already started on oxygen in the  emergency department.  She was oxygenating at 99% on 10 liters of oxygen.  The patient was given nebulizer treatments in the emergency department;  however, she did not improve enough to go home.  Patient was therefore  admitted for further evaluation and management.   HOSPITAL COURSE:  1.  ACUTE ASTHMATIC BRONCHITIS EXACERBATION:  The patient was started on      treatment with Xopenex nebulizer every 4 hours, Solu-Medrol 60 mg IV      q.6h., Avelox 400 mg IV daily, and Singulair as well as Claritin.      Oxygen therapy was started and titrated to keep the oxygen saturations      greater than 92%.  The patient was also started prophylactically on      Protonix and subcu heparin.  During the hospital course, the patient's      capillary blood sugars did increase; therefore, a sliding-scale insulin      regimen as well as Lantus were started.  Following the initiation of      insulin, the capillary blood sugars became much more controlled.  The      patient's white blood cell count did increase from 10.6 on admission to      14.5.  This was felt to be secondary to the steroid therapy and less      likely infectious bronchitis.  After several days of treatment, the      patient improved clinically and symptomatically.  She began to walk in      the hallways several times daily over the last 48 hours of      hospitalization.  At the time of hospital discharge, the patient had      only mild expiratory wheezes; however, her lungs were significantly      improved with excellent air movement on exam.  She will be discharged to      home on Avelox therapy for three more days along with a prednisone dose      taper over the next 12 days.  She was advised to continue the Advair,      albuterol, Singulair, and Claritin as previously ordered.  The patient      was  also advised to take Pepcid 10 mg twice daily, as directed per the      over-the-counter insert.  It is important to note that a repeat chest x-      ray on March 23, 2004 revealed mild diffuse peribronchial thickening,      mild COPD, no active infiltrate or consolidation.   1.  MILD GROSS HEMATURIA:  The patient noticed some red blood in her urine      on hospital day #4.  The patient had no complaints of painful urination,      urinary hesitancy, or increasing urinary frequency.  An urinalysis was      ordered and revealed moderate blood and moderate leukocytes.  The subcu      heparin, which had been prescribed on admission, was discontinued.      Following the discontinuance of the subcu heparin, the patient had no      further episodes of hematuria.  As stated above, the patient was treated      with Avelox antibiotic during the hospital course.  The patient was      treated with six days of Avelox therapy.  Given her age, the patient      will be referred to an urologist for further evaluation in the      outpatient setting.  An appointment has been scheduled for the patient      to see the urologist, Dr. Vonita Moss, on February 16th at 2:30 p.m.      DF/MEDQ  D:  03/24/2004  T:  03/24/2004  Job:  [id number]   cc:   Maretta Bees. Vonita Moss, M.D.  [address]  [place]  [place] [address]  Fax: [phone]

## 2010-07-08 NOTE — Discharge Summary (Signed)
[place]  Patient:    [name], [name] Visit Number: [id number] MRN: [id number]          Service Type: MED Location: 3000 3009 01 Attending Physician:  McDiarmid, Leighton Roach. Dictated by:   Lucille Passy, M.D. Admit Date:  02/22/2001 Discharge Date: 02/25/2001   CC:         Solon Palm, M.D.   Discharge Summary  DATE OF BIRTH:  [birth year]  DISCHARGE DIAGNOSES: 1. Asthma exacerbation. 2. Acute bronchitis. 3. Allergic rhinitis.  DISCHARGE MEDICATIONS: 1. Advair 500/50 one puff b.i.d. 2. Singular 10 mg p.o. q.h.s. 3. Albuterol MDI two puffs q.4h. until peak flows return to normal level than    as needed. 4. Prednisone taper 20 mg tablets three tablets p.o. b.i.d. x2 days, then    three tablets p.o. q.d. x4 days, then two tablets p.o. q.d. x4 days, then    one tablet p.o. q.d. x2 days, then 10 mg tablets one p.o. q.d. x2 days,    then stop. 5. Rhinocort two sprays per nostril b.i.d. while symptoms of cough and    congestion last. 6. Guaifenesin 600 mg tablets two p.o. b.i.d. for cough.  ACTIVITY:  As tolerated.  SPECIAL INSTRUCTIONS:  The patient was instructed to return to the emergency room or call the family practice center for increasing wheeze, shortness of breath or worsening peak flows.  FOLLOWUP:  Follow up with Dr. Roger Shelter within two weeks of discharge at the [facility].  The patient was advised to call for an appointment.  HISTORY OF PRESENT ILLNESS:  [name] is a 65 year old, African-American female who presented with an asthma exacerbation after being diagnosed with a bronchitis about a week prior to admission.  She was admitted from the [facility] walk-in clinic where she had complained of wheezing and chest tightness and had received albuterol and Atrovent treatments with a Solu-Medrol injection, but subsequently had a near syncopal episode associated with diaphoresis and nausea  likely due to the pain of the shot.  HOSPITAL COURSE:  She was admitted and treated with p.o. steroids, oxygen to keep O2 saturations above 90 and was also treated with albuterol and Atrovent nebulizers, Singulair, Advair, as well as guaifenesin for cough.  The patient made slow improvement and her prednisone was actually increased to 60 mg b.i.d. from a q.d. dose as she was not showing rapid improvement.  However, by hospital day #4, the patient felt almost back to baseline and was able to ambulate in the hall off oxygen with no shortness of breath and was maintaining her oxygen saturation in the low to mid 90s on room air.  Her peak flow were only in the 150s to 170s even after nebulizer treatments and the patient was still wheezing.  As she was moving much better air and felt almost back to normal, she was felt okay to go home.  The patient was only using Advair at home for her asthma, although she had used Singulair in the past and had albuterol inhaler and nebulizer equipment at home which she was not using. She was instructed to continue her Advair at home, to continue albuterol MDI q.4h. until her peak flows returned to normal and to use Singulair.  She was discharged on a two-week prednisone taper as well as guaifenesin for cough and Rhinocort for her allergic rhinitis.  By hospital day #4, the patients cough became productive of clear phlegm, so it is likely that she  has an acute bronchitis continuing which is likely the cause of her asthma exacerbation. Of note, the patient was afebrile throughout admission.  The patients near syncopal episode on the day of admission was felt to be vasovagal.  The patient was also initially monitored on telemetry with no abnormalities, so telemetry was discontinued.  The patient should have a pneumovax if she has not already received one. Dictated by:   Lucille Passy, M.D. Attending Physician:  McDiarmid, Tawanna Cooler D. DD:  02/25/01 TD:   02/26/01 Job: [id number]

## 2010-07-08 NOTE — Op Note (Signed)
NAMEROLENA, [name] NO.:  [id number]   MEDICAL RECORD NO.:  [id number]           PATIENT TYPE:   LOCATION:                                 FACILITY:   PHYSICIAN:  Salley Scarlet., M.D.DATE OF BIRTH:  10-Mar-1945   DATE OF PROCEDURE:  06/01/2007  DATE OF DISCHARGE:                               OPERATIVE REPORT   PREOPERATIVE DIAGNOSIS:  Immature left eye.   POSTOPERATIVE DIAGNOSIS:  Immature left eye.   OPERATION:  Kelman phacoemulsification of cataract left eye with  intraocular lens implantation.   ANESTHESIA:  Local, using Xylocaine 2% and Marcaine 0.75% with Wydase.   JUSTIFICATION FOR PROCEDURE:  This is a 65 year old lady who complained  of blurring of vision with difficulty seeing to read.  She also has  trouble seeing to drive.  She was evaluated and found to have a visual  acuity, best corrected at 20 to 40/40 on the right and 20/60 on the  left.  There are bilateral posterior subcapsular cataracts worse on the  left than right.  Cataract extraction with intraocular lens implantation  was recommended.  She is admitted at this time for that purpose.   PROCEDURE:  Under the influence of IV sedation, a Van Lint akinesia and  retrobulbar anesthesia was given.  The patient was prepped and draped in  usual manner.  The lid speculum was inserted under the upper and lower  lid of the left eye, and a 4-0 silk traction suture was passed through  the belly of the superior rectus muscle retraction.  A fornix-based  conjunctival flap was turned and hemostasis achieved using cautery.  An  incision made in the sclera at the limbus.  This incision was dissected  down to clear cornea using a crescent blade.  A sideport incision was  made at 1:30 o'clock position.  OcuCoat was injected into the eye  through the sideport incision.  The anterior chamber was then entered  through the corneoscleral tunnel incision at 11:30 o'clock position  using a 2.75 meter  keratome.  An anterior capsulotomy was performed  using a bent #25 gauge needle.  The nucleus was hydrodissected using  Xylocaine.  The KPE handpiece was passed into the eye, and the nucleus  was emulsified without difficulty.  The residual cortical material was  aspirated.  The posterior capsule was polished using an olive tip  polisher.  The wound was widened slightly to accommodate foldable  silicone lens.  The lens was seated into the eye behind the iris without  difficulty.  The anterior chamber was reformed and pupil constricted  using Miochol.  The lips of the wound were hydrated and tested to make  sure that there was no leak.  After ascertaining that there was no leak,  the conjunctiva was closed over wound using thermal cautery.  Celestone  1 mL and 0.50 mL of gentamicin were injected subconjunctivally.  Maxitrol ophthalmic ointment and Pontocaine ointment were applied along  with a patch and Fox shield.  The patient tolerated the procedure well  and was discharged to the post anesthesia recovery room in  satisfactory condition.  She is instructed to rest today.  To take  Vicodin every 4 hours as needed for pain and to see me in office  tomorrow for further evaluation.   DISCHARGE DIAGNOSIS:  Immature cataract left eye.      Salley Scarlet., M.D.  Electronically Signed     TB/MEDQ  D:  06/01/2007  T:  06/01/2007  Job:  [id number]

## 2010-07-08 NOTE — H&P (Signed)
[name], [name]             ACCOUNT NO.:  [id number]   MEDICAL RECORD NO.:  [id number]          PATIENT TYPE:  EMS   LOCATION:  ED                           FACILITY:  [facility]   PHYSICIAN:  Isidor Holts, M.D.  DATE OF BIRTH:  12-08-1945   DATE OF ADMISSION:  03/19/2004  DATE OF DISCHARGE:                                HISTORY & PHYSICAL   PRIMARY MEDICAL DOCTOR:  Unassigned.   CHIEF COMPLAINT:  Cough, shortness of breath and wheeze for 3 days.   HISTORY OF PRESENT ILLNESS:  This is a 65 year old known asthmatic who was  quite well until March 17, 2004, when she developed cough productive of  yellowish phlegm associated with shortness of breath and wheeze. Denies  fever, denies chest pain.  She utilized her home albuterol nebulizers  without much improvement.  On March 18, 2004 she went to the Urgent Care  Center and was prescribed Biaxin as well as Tylenol Cold with Codeine, her  symptoms however, became somewhat worse on March 19, 2004 and her son  called EMS.  Of note is the fact that she has recently been troubled by  seasonal allergy symptoms for which she utilizes over-the-counter  antihistamines.   PAST MEDICAL HISTORY:  1.  Seasonal allergic rhinitis.  2.  Bronchial asthma.  3.  Status post intubation/ventilation 1995 and 2000 for respiratory      failure.   MEDICATIONS:  1.  Advair Diskus (500/50) one puff b.i.d.  2.  Albuterol nebulizer/MDI p.r.n.  3.  Singulair 10 mg p.o. at bedtime.  4.  OTC antihistamines p.r.n.   ALLERGIES:  ASPIRIN.   SOCIAL/FAMILY HISTORY:  Patient is divorced several years ago.  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  She has one son, also has one brother who is  alive and well, two sisters one of whom has bronchial asthma.  Her father  died at age 16 years from emphysema, he was a smoker.  Her mother is 55  years with hypertension.   SYSTEMS  REVIEW:  As per HPI and chief complaint otherwise negative.   PHYSICAL EXAMINATION:  VITALS:  Temperature 98.8, pulse 116 per minute,  respiratory rate 30, BP 161/96 mmHg, pulse oximeter on 10L of oxygen 99%.  Patient is not short of breath at rest at the time of this examination  however, she has a bronchodilator nebulizer in progress.  GENERAL:  She is alert, communicating complete sentences and does not appear  distressed, and accessory respiratory muscles are not in play.  HEENT:  No clinical pallor, no jaundice, no conjunctival injection, throat  is clear.  Neck is supple, JVP not seen, no palpable lymphadenopathy, no  palpable goiter.  CHEST:  Bilateral polyphonic respiratory wheezes, no crackles.  Heart sounds  1 and 2 heard, normal regular tachycardia.  ABDOMEN:  Full, soft and nontender, there is no palpable organomegaly, no  palpable masses, normal bowel sounds.  LOWER EXTREMITY EXAMINATION:  No pitting edema.  Palpable peripheral pulses.  MUSCULOSKELETAL SYSTEM:  Quite unremarkable.  CENTRAL NERVOUS  SYSTEM:  No focal neurologic deficits on gross examination.   INVESTIGATIONS:  CBC:  WBC 10.7, hemoglobin 13.5, hematocrit 42.5, platelets  254.  Electrolytes:  Sodium 139, potassium 3.7, chloride 106, CO2 29, BUN 6,  creatinine 0.9, glucose 132.  Chest x-ray shows no infiltrate.   ASSESSMENT AND PLAN:  1.  Infective exacerbation of bronchial asthma secondary to respiratory      tract infection.  We will admit patient for monitoring, treat with      bronchodilator nebulizers, p.r.n. oxygen and parenteral steroids and      Avelox to cover community-acquired respiratory pathogens.  2.  Seasonal allergies.  We will manage with antihistaminic treatment.  3.  Hypertension.  This may be secondary to patient's current respiratory      difficulties, as she has no prior history of hypertension.  We shall      monitor this and treat as appropriate.  Will also institute deep vein  thrombosis and gastrointestinal prophylaxis  with heparin and proton pump inhibitor respectively.  Further management will depend on clinical course.      CO/MEDQ  D:  03/19/2004  T:  03/19/2004  Job:  [id number]

## 2010-09-05 ENCOUNTER — Inpatient Hospital Stay (HOSPITAL_COMMUNITY)
Admission: AD | Admit: 2010-09-05 | Discharge: 2010-09-14 | DRG: 190 | Disposition: A | Discharge status: Home / Self Care | Payer: Medicare Other | Source: Other Acute Inpatient Hospital | Attending: Internal Medicine | Admitting: Internal Medicine

## 2010-09-05 ENCOUNTER — Other Ambulatory Visit: Payer: Self-pay

## 2010-09-05 ENCOUNTER — Emergency Department (HOSPITAL_BASED_OUTPATIENT_CLINIC_OR_DEPARTMENT_OTHER)
Admission: EM | Admit: 2010-09-05 | Discharge: 2010-09-05 | Disposition: A | Discharge status: Hospice | Payer: Medicare Other | Source: Home / Self Care | Attending: Emergency Medicine | Admitting: Emergency Medicine

## 2010-09-05 ENCOUNTER — Emergency Department (INDEPENDENT_AMBULATORY_CARE_PROVIDER_SITE_OTHER): Payer: Medicare Other

## 2010-09-05 ENCOUNTER — Encounter: Payer: Self-pay | Admitting: *Deleted

## 2010-09-05 DIAGNOSIS — R0602 Shortness of breath: Secondary | ICD-10-CM

## 2010-09-05 DIAGNOSIS — J45902 Unspecified asthma with status asthmaticus: Secondary | ICD-10-CM

## 2010-09-05 DIAGNOSIS — J9600 Acute respiratory failure, unspecified whether with hypoxia or hypercapnia: Secondary | ICD-10-CM | POA: Diagnosis not present

## 2010-09-05 DIAGNOSIS — J45901 Unspecified asthma with (acute) exacerbation: Principal | ICD-10-CM | POA: Diagnosis present

## 2010-09-05 DIAGNOSIS — E049 Nontoxic goiter, unspecified: Secondary | ICD-10-CM

## 2010-09-05 DIAGNOSIS — J019 Acute sinusitis, unspecified: Secondary | ICD-10-CM | POA: Diagnosis present

## 2010-09-05 DIAGNOSIS — J441 Chronic obstructive pulmonary disease with (acute) exacerbation: Principal | ICD-10-CM | POA: Diagnosis present

## 2010-09-05 DIAGNOSIS — D649 Anemia, unspecified: Secondary | ICD-10-CM | POA: Diagnosis present

## 2010-09-05 DIAGNOSIS — K219 Gastro-esophageal reflux disease without esophagitis: Secondary | ICD-10-CM | POA: Diagnosis present

## 2010-09-05 DIAGNOSIS — I10 Essential (primary) hypertension: Secondary | ICD-10-CM | POA: Diagnosis present

## 2010-09-05 DIAGNOSIS — R062 Wheezing: Secondary | ICD-10-CM

## 2010-09-05 LAB — CBC
Hemoglobin: 12.2 g/dL (ref 12.0–15.0)
MCH: 24.9 pg — ABNORMAL LOW (ref 26.0–34.0)
MCV: 76.5 fL — ABNORMAL LOW (ref 78.0–100.0)
RBC: 4.89 MIL/uL (ref 3.87–5.11)
WBC: 10.1 10*3/uL (ref 4.0–10.5)

## 2010-09-05 LAB — POCT I-STAT 3, ART BLOOD GAS (G3+)
Acid-base deficit: 1 mmol/L (ref 0.0–2.0)
O2 Saturation: 85 %
Patient temperature: 37
TCO2: 25 mmol/L (ref 0–100)
pCO2 arterial: 37.3 mmHg (ref 35.0–45.0)

## 2010-09-05 LAB — URINALYSIS, ROUTINE W REFLEX MICROSCOPIC
Bilirubin Urine: NEGATIVE
Hgb urine dipstick: NEGATIVE
Ketones, ur: NEGATIVE mg/dL
Nitrite: NEGATIVE
Protein, ur: NEGATIVE mg/dL
Specific Gravity, Urine: 1.011 (ref 1.005–1.030)
Urobilinogen, UA: 0.2 mg/dL (ref 0.0–1.0)

## 2010-09-05 LAB — COMPREHENSIVE METABOLIC PANEL
ALT: 12 U/L (ref 0–35)
Alkaline Phosphatase: 133 U/L — ABNORMAL HIGH (ref 39–117)
BUN: 11 mg/dL (ref 6–23)
CO2: 23 mEq/L (ref 19–32)
GFR calc Af Amer: >60 mL/min (ref >60)
GFR calc non Af Amer: >60 mL/min (ref >60)
Glucose, Bld: 136 mg/dL — ABNORMAL HIGH (ref 70–99)
Potassium: 3.2 mEq/L — ABNORMAL LOW (ref 3.5–5.1)
Total Protein: 7.8 g/dL (ref 6.0–8.3)

## 2010-09-05 LAB — DIFFERENTIAL
Eosinophils Absolute: 0 10*3/uL (ref 0.0–0.7)
Eosinophils Relative: 0 % (ref 0–5)
Lymphocytes Relative: 3 % — ABNORMAL LOW (ref 12–46)
Lymphs Abs: 0.3 10*3/uL — ABNORMAL LOW (ref 0.7–4.0)
Monocytes Relative: 2 % — ABNORMAL LOW (ref 3–12)

## 2010-09-05 LAB — MRSA PCR SCREENING: MRSA by PCR: NEGATIVE

## 2010-09-05 LAB — URINE MICROSCOPIC-ADD ON

## 2010-09-05 LAB — D-DIMER, QUANTITATIVE: D-Dimer, Quant: 1.38 ug/mL-FEU — ABNORMAL HIGH (ref 0.00–0.48)

## 2010-09-05 MED ORDER — DEXTROSE 5 % IV SOLN
1.0000 g | Freq: Once | INTRAVENOUS | Status: AC
  Administered 2010-09-05: 1 g via INTRAVENOUS
  Filled 2010-09-05: qty 1

## 2010-09-05 MED ORDER — PANTOPRAZOLE SODIUM 40 MG IV SOLR
40.0000 mg | Freq: Once | INTRAVENOUS | Status: AC
  Administered 2010-09-05: 40 mg via INTRAVENOUS
  Filled 2010-09-05: qty 40

## 2010-09-05 MED ORDER — AZITHROMYCIN 250 MG PO TABS
500.0000 mg | ORAL_TABLET | Freq: Once | ORAL | Status: AC
  Administered 2010-09-05: 500 mg via ORAL
  Filled 2010-09-05 (×2): qty 2

## 2010-09-05 MED ORDER — POTASSIUM CHLORIDE 20 MEQ/15ML (10%) PO LIQD
40.0000 meq | Freq: Once | ORAL | Status: AC
  Administered 2010-09-05: 40 meq via ORAL
  Filled 2010-09-05: qty 30

## 2010-09-05 MED ORDER — ALBUTEROL SULFATE (5 MG/ML) 0.5% IN NEBU
5.0000 mg | INHALATION_SOLUTION | Freq: Once | RESPIRATORY_TRACT | Status: AC
  Administered 2010-09-05: 5 mg via RESPIRATORY_TRACT

## 2010-09-05 MED ORDER — ALBUTEROL SULFATE (2.5 MG/3ML) 0.083% IN NEBU
INHALATION_SOLUTION | RESPIRATORY_TRACT | Status: AC
  Filled 2010-09-05: qty 3

## 2010-09-05 MED ORDER — ALBUTEROL SULFATE (2.5 MG/3ML) 0.083% IN NEBU
INHALATION_SOLUTION | RESPIRATORY_TRACT | Status: AC
  Filled 2010-09-05: qty 9

## 2010-09-05 MED ORDER — METHYLPREDNISOLONE SODIUM SUCC 125 MG IJ SOLR
125.0000 mg | Freq: Once | INTRAMUSCULAR | Status: AC
  Administered 2010-09-05: 125 mg via INTRAVENOUS
  Filled 2010-09-05: qty 2

## 2010-09-05 MED ORDER — ALBUTEROL (5 MG/ML) CONTINUOUS INHALATION SOLN
7.5000 mg/h | INHALATION_SOLUTION | Freq: Once | RESPIRATORY_TRACT | Status: AC
  Administered 2010-09-05: 7.5 mg/h via RESPIRATORY_TRACT
  Filled 2010-09-05: qty 0.5
  Filled 2010-09-05: qty 20

## 2010-09-05 MED ORDER — ENOXAPARIN SODIUM 100 MG/ML ~~LOC~~ SOLN
1.0000 mg/kg | SUBCUTANEOUS | Status: DC
  Administered 2010-09-05: 85 mg via SUBCUTANEOUS
  Filled 2010-09-05: qty 1

## 2010-09-05 MED ORDER — IOHEXOL 350 MG/ML SOLN
80.0000 mL | Freq: Once | INTRAVENOUS | Status: AC | PRN
  Administered 2010-09-05: 80 mL via INTRAVENOUS

## 2010-09-05 NOTE — ED Notes (Signed)
I-Stat ABG drawn on room air, results to MD patient placed back on 2 l/m NCO2.  Patient resting, family at bedside.

## 2010-09-05 NOTE — ED Notes (Signed)
Pt [facility]  [facility] pt reports symptoms of shortness of breath and wheezing was given albuterol treatment at urgent care and given 10mg  albuterol albuterol by ems and 125 mg of solumedrol as well as 500 mcg of atrovent pt is still having some wheezing pt states she has been intubated due to asthma exacerbation 7 years ago.

## 2010-09-05 NOTE — ED Provider Notes (Addendum)
History     Chief Complaint  Patient presents with  . Shortness of Breath       HPI Patient with history of asthma with increased wheezing for 2-3 days.  She was seen by her pmd today and sent here for admission.  Patient with history of status asthmaticus taking albuterol and advair.  She has increased her albuterol use. She was given albuterol neb and solumedrol by ems and continues to be dyspneic with some productive cough of dark sputum.  She has not had chills or fever, chest pain, or peripheral edema.  She has been intubated in the past.  Her sats were in the 80s on arrival without oxygen but increased promptly with nasal cannula.  She is not on oxygen at home. Past Medical History  Diagnosis Date  . Asthma     History reviewed. No pertinent past surgical history.  History reviewed. No pertinent family history.  History  Substance Use Topics  . Smoking status: Former Games developer  . Smokeless tobacco: Never Used  . Alcohol Use: No    OB History    Grav Para Term Preterm Abortions TAB SAB Ect Mult Living                  Review of Systems  Constitutional: Positive for activity change, appetite change and fatigue. Negative for chills.  HENT: Negative for neck stiffness.   Respiratory: Positive for shortness of breath and wheezing.   Cardiovascular: Negative for chest pain and leg swelling.  All other systems reviewed and are negative.    Physical Exam  BP 155/68  Pulse 117  Temp(Src) 98.2 F (36.8 C) (Oral)  Resp 28  Ht 5\' 10"  (1.778 m)  Wt 190 lb (86.183 kg)  BMI 27.26 kg/m2  SpO2 97%  Physical Exam  Vitals reviewed. Constitutional: She is oriented to person, place, and time. She appears well-developed and well-nourished.  HENT:  Head: Normocephalic.  Neck: Normal range of motion.  Cardiovascular: Intact distal pulses.  Tachycardia present.   Pulmonary/Chest: Accessory muscle usage present. Tachypnea noted. She has wheezes.  Abdominal: Soft. Normal  appearance and bowel sounds are normal.  Musculoskeletal: Normal range of motion.  Neurological: She is alert and oriented to person, place, and time.  Skin: Skin is warm, dry and intact. No cyanosis. Nails show no clubbing.  09/05/2010   Date: 09/05/2010  Rate:123    Rhythm: sinus tachycardia  QRS Axis: normal  Intervals: normal  ST/T Wave abnormalities: nonspecific ST changes  Conduction Disutrbances:none  Narrative Interpretation:   Old EKG Reviewed: none available   ED Course  CRITICAL CARE Performed by: Hilario Quarry Authorized by: Hilario Quarry Total critical care time: 90 minutes Critical care time was exclusive of separately billable procedures and treating other patients. Critical care was necessary to treat or prevent imminent or life-threatening deterioration of the following conditions: respiratory failure. Critical care was time spent personally by me on the following activities: development of treatment plan with patient or surrogate, discussions with consultants, evaluation of patient's response to treatment, examination of patient, obtaining history from patient or surrogate, ordering and performing treatments and interventions, ordering and review of laboratory studies, ordering and review of radiographic studies, re-evaluation of patient's condition, review of old charts and pulse oximetry.    MDM Patient with continued wheezing after repeat neb and solumedrol  Reviewed prior admission from 2009 and one prior to that with intubation.  ABG with decreased p02 on room air.  Patient continues  with end expiratory wheezing but much improved however respiratory rate continues increased.  Plan admission.  Critical care tim 90 minutes Discussed with Dr. Venetia Constable and ct angio ordered.  Results pending and care discussed with Dr. Hyacinth Meeker .  He will review ct angio results prior to patient transfer.    Hilario Quarry, MD 09/05/10 1538  Hilario Quarry, MD 09/05/10  1544  Hilario Quarry, MD 09/05/10 1610  Hilario Quarry, MD 09/05/10 [id number]  Hilario Quarry, MD 09/05/10 [id number]  Hilario Quarry, MD 09/05/10 1606  Hilario Quarry, MD 09/05/10 8119  Hilario Quarry, MD 09/05/10 [phone]

## 2010-09-06 DIAGNOSIS — J9600 Acute respiratory failure, unspecified whether with hypoxia or hypercapnia: Secondary | ICD-10-CM

## 2010-09-06 DIAGNOSIS — J45902 Unspecified asthma with status asthmaticus: Secondary | ICD-10-CM

## 2010-09-06 LAB — BLOOD GAS, ARTERIAL
Acid-base deficit: 0.3 mmol/L (ref 0.0–2.0)
Bicarbonate: 25.5 mEq/L — ABNORMAL HIGH (ref 20.0–24.0)
Delivery systems: POSITIVE
Drawn by: 340271
Expiratory PAP: 6
FIO2: 0.4 %
Inspiratory PAP: 12
Mode: POSITIVE
O2 Saturation: 98.4 %
Patient temperature: 98.6
Patient temperature: 98.6
TCO2: 23.9 mmol/L (ref 0–100)
TCO2: 23.4 mmol/L (ref 0–100)
pCO2 arterial: 54.5 mmHg — ABNORMAL HIGH (ref 35.0–45.0)
pH, Arterial: 7.3 — ABNORMAL LOW (ref 7.350–7.400)
pO2, Arterial: 109 mmHg — ABNORMAL HIGH (ref 80.0–100.0)

## 2010-09-06 LAB — COMPREHENSIVE METABOLIC PANEL
ALT: 16 U/L (ref 0–35)
AST: 23 U/L (ref 0–37)
Alkaline Phosphatase: 134 U/L — ABNORMAL HIGH (ref 39–117)
GFR calc Af Amer: >60 mL/min (ref >60)
Glucose, Bld: 151 mg/dL — ABNORMAL HIGH (ref 70–99)
Potassium: 5.9 mEq/L — ABNORMAL HIGH (ref 3.5–5.1)
Sodium: 138 mEq/L (ref 135–145)
Total Protein: 8.5 g/dL — ABNORMAL HIGH (ref 6.0–8.3)

## 2010-09-06 LAB — DIFFERENTIAL
Eosinophils Absolute: 0 10*3/uL (ref 0.0–0.7)
Lymphocytes Relative: 13 % (ref 12–46)
Lymphs Abs: 1.1 10*3/uL (ref 0.7–4.0)
Monocytes Relative: 2 % — ABNORMAL LOW (ref 3–12)
Neutro Abs: 6.9 10*3/uL (ref 1.7–7.7)
Neutrophils Relative %: 84 % — ABNORMAL HIGH (ref 43–77)

## 2010-09-06 LAB — CBC
Hemoglobin: 13.3 g/dL (ref 12.0–15.0)
MCH: 25.2 pg — ABNORMAL LOW (ref 26.0–34.0)
MCV: 79.9 fL (ref 78.0–100.0)
Platelets: 196 10*3/uL (ref 150–400)
RBC: 5.28 MIL/uL — ABNORMAL HIGH (ref 3.87–5.11)
WBC: 8.2 10*3/uL (ref 4.0–10.5)

## 2010-09-06 LAB — BASIC METABOLIC PANEL
BUN: 16 mg/dL (ref 6–23)
CO2: 25 mEq/L (ref 19–32)
Chloride: 105 mEq/L (ref 96–112)
Creatinine, Ser: 0.97 mg/dL (ref 0.50–1.10)
GFR calc Af Amer: >60 mL/min (ref >60)
Potassium: 4.8 mEq/L (ref 3.5–5.1)

## 2010-09-06 NOTE — H&P (Signed)
[name], [name]             ACCOUNT NO.:  [id number]  MEDICAL RECORD NO.:  [id number]  LOCATION:  1230                         FACILITY:  [facility]  PHYSICIAN:  Eduard Clos, MDDATE OF BIRTH:  07-Jul-1945  DATE OF ADMISSION:  09/05/2010 DATE OF DISCHARGE:                             HISTORY & PHYSICAL   PRIMARY CARE PHYSICIAN:  Knox Royalty, M.D.  CHIEF COMPLAINT:  Shortness of breath.  HISTORY OF PRESENT ILLNESS:  [age]-year-old female with known history of bronchial asthma, who has been experiencing shortness of breath since last evening, which is getting progressively worse with cough and phlegm, productive sputum, which is yellowish in color. Denies any fever, chills or chest pain.  Denies any dizziness, loss of consciousness.  Denies any abdominal pain, dysuria, discharges, focal deficit, headache or visual symptoms.  In the ER, patient was found to be wheezing despite multiple treatments with albuterol.  Patient's D-dimer was found to be high.  A CT angio chest for PE was done, which was negative for PE, but was showing possibility of left upper lobe infiltrate.  At this time, patient is being admitted for bronchial asthma exacerbation and possible pneumonia.  PAST MEDICAL HISTORY:  History of bronchial asthma and has had a history of intubation in 1997.  The last she was admitted in the past was in 2009 as per patient.  PAST SURGICAL HISTORY:  Has had tonsillectomy and tubal ligation.  MEDICATIONS ON ADMISSION: 1. Advair Diskus 250/50 one puff b.i.d. 2. Albuterol inhaler and nebulizer p.r.n.  SOCIAL HISTORY:  Patient did smoke for a brief time in 1980, which she quit.  Denies any alcohol or drug abuse.  Lives with her son.  Full code.  FAMILY HISTORY:  Positive for asthma and stroke.  ALLERGIES:  ASPIRIN, which causes hives and shortness of breath.  REVIEW OF SYSTEMS:  As per the history of presenting illness, nothing else  significant.  PHYSICAL EXAMINATION:  GENERAL:  Patient examined bedside, not in acute distress at this time.  Patient is able to complete full sentences without difficulty. VITAL SIGNS:  Blood pressure 150/68, pulse is 120-130 per minute and sinus tachy, temperature 98.2, respirations 24 per minute, O2 sat 100% on 2 L. HEENT:  Anicteric.  No pallor.  No discharge from ears, eyes, nose or mouth.  No facial asymmetry.  Tongue is midline. CHEST:  Bilateral air entry present.  No rhonchi or any crepitation. HEART:  S1, S2 heard. ABDOMEN:  Soft, nontender.  Bowel sounds heard. CNS:  Patient alert and oriented to time, place and person. EXTREMITIES:  Moves upper and lower extremities, 5/5.  Peripheral pulses felt.  No edema.  DIAGNOSTIC STUDIES:  EKG showed normal sinus rhythm with heart rate around 123 beats per minute with nonspecific ST-T changes.  Patient does not have any chest pain.  Chest x-ray shows mild interstitial prominence, is nonspecific.  No focal consolidation.  CT angio chest shows no CT findings for pulmonary embolism, normal thoracic aortaexcept for atherosclerotic changes at the arch, peribronchial thickening suggesting bronchitis and a small left upper lobe infiltrate.  LABORATORY DATA:  CBC:  WBC 10.1, hemoglobin 12.2, hematocrit is 37.4, platelets 249,00.  D-dimer is 1.38.  Complete  metabolic panel:  Sodium 138, potassium 3.2, chloride 102, carbon dioxide 23, glucose 136, BUN 11, creatinine 0.7, total bilirubin is 0.3, alk phos 133.  AST 16, ALT 12, phosphorous 7.8, albumin 3.8, calcium 9.4, BNP is 105.  UA shows nitrites negative, glucose negative, ketones negative, small leukocytes, cells few, WBC 7-10, bacteria few, mucus present.  ASSESSMENT: 1. Acute exacerbation of bronchial asthma. 2. Possible pneumonia.  PLAN: 1. Plan is to admit patient to step-down unit. 2. For her bronchial asthma exacerbation, patient will be placed on     Solu-Medrol, slowly  transition to prednisone p.o.  At this time, I     have placed the patient on Xopenex as the patient does have     significant sinus tachycardia and will also have Pulmicort and also     has Protonix. 3. For possible pneumonia, at this time I am placing patient on     Levaquin, also get blood cultures. 4. Patient has possible UTI for which I am getting urine cultures and     sensitivity and based on that we will decide further antibiotics.     For now, patient is on Levaquin. 5. Further recommendation as condition evolves.     Eduard Clos, MD     ANK/MEDQ  D:  09/05/2010  T:  09/05/2010  Job:  [id number]  cc:   Knox Royalty, MD Fax: [phone]  Electronically Signed by Midge Minium MD on 09/06/2010 07:27:44 AM

## 2010-09-07 ENCOUNTER — Inpatient Hospital Stay (HOSPITAL_COMMUNITY): Payer: Medicare Other

## 2010-09-07 LAB — URINE CULTURE
Culture  Setup Time: 201207171204
Special Requests: NEGATIVE

## 2010-09-07 LAB — BLOOD GAS, ARTERIAL
Bicarbonate: 25.4 mEq/L — ABNORMAL HIGH (ref 20.0–24.0)
Drawn by: 317871
O2 Content: 2 L/min
pCO2 arterial: 45.2 mmHg — ABNORMAL HIGH (ref 35.0–45.0)
pO2, Arterial: 83.6 mmHg (ref 80.0–100.0)

## 2010-09-07 LAB — BASIC METABOLIC PANEL
BUN: 28 mg/dL — ABNORMAL HIGH (ref 6–23)
Chloride: 106 mEq/L (ref 96–112)
Creatinine, Ser: 1.14 mg/dL — ABNORMAL HIGH (ref 0.50–1.10)
GFR calc Af Amer: 58 mL/min — ABNORMAL LOW (ref >60)
Glucose, Bld: 130 mg/dL — ABNORMAL HIGH (ref 70–99)
Potassium: 4.6 mEq/L (ref 3.5–5.1)

## 2010-09-07 LAB — CBC
HCT: 34.8 % — ABNORMAL LOW (ref 36.0–46.0)
Hemoglobin: 10.6 g/dL — ABNORMAL LOW (ref 12.0–15.0)
MCHC: 30.5 g/dL (ref 30.0–36.0)
MCV: 79.8 fL (ref 78.0–100.0)
RDW: 15.2 % (ref 11.5–15.5)
WBC: 10.3 10*3/uL (ref 4.0–10.5)

## 2010-09-10 DIAGNOSIS — D649 Anemia, unspecified: Secondary | ICD-10-CM

## 2010-09-10 DIAGNOSIS — J9600 Acute respiratory failure, unspecified whether with hypoxia or hypercapnia: Secondary | ICD-10-CM

## 2010-09-10 DIAGNOSIS — J45902 Unspecified asthma with status asthmaticus: Secondary | ICD-10-CM

## 2010-09-10 LAB — BASIC METABOLIC PANEL
BUN: 17 mg/dL (ref 6–23)
CO2: 32 mEq/L (ref 19–32)
Calcium: 9.3 mg/dL (ref 8.4–10.5)
Chloride: 101 mEq/L (ref 96–112)
Creatinine, Ser: 0.92 mg/dL (ref 0.50–1.10)
Glucose, Bld: 115 mg/dL — ABNORMAL HIGH (ref 70–99)

## 2010-09-11 DIAGNOSIS — J9600 Acute respiratory failure, unspecified whether with hypoxia or hypercapnia: Secondary | ICD-10-CM

## 2010-09-11 DIAGNOSIS — J45902 Unspecified asthma with status asthmaticus: Secondary | ICD-10-CM

## 2010-09-11 DIAGNOSIS — D649 Anemia, unspecified: Secondary | ICD-10-CM

## 2010-09-11 LAB — DIFFERENTIAL
Band Neutrophils: 0 % (ref 0–10)
Basophils Absolute: 0 10*3/uL (ref 0.0–0.1)
Basophils Relative: 0 % (ref 0–1)
Eosinophils Absolute: 0 10*3/uL (ref 0.0–0.7)
Eosinophils Relative: 0 % (ref 0–5)
Metamyelocytes Relative: 0 %
Myelocytes: 0 %
Neutro Abs: 6.6 10*3/uL (ref 1.7–7.7)
Neutrophils Relative %: 62 % (ref 43–77)

## 2010-09-11 LAB — IRON AND TIBC
Iron: 72 ug/dL (ref 42–135)
Saturation Ratios: 28 % (ref 20–55)

## 2010-09-11 LAB — CBC
MCH: 24 pg — ABNORMAL LOW (ref 26.0–34.0)
MCV: 78.4 fL (ref 78.0–100.0)
Platelets: 231 10*3/uL (ref 150–400)
RDW: 14.9 % (ref 11.5–15.5)
WBC: 10.6 10*3/uL — ABNORMAL HIGH (ref 4.0–10.5)

## 2010-09-12 ENCOUNTER — Inpatient Hospital Stay (HOSPITAL_COMMUNITY): Payer: Medicare Other

## 2010-09-13 LAB — PROTEIN ELECTROPH W RFLX QUANT IMMUNOGLOBULINS
Albumin ELP: 52 % — ABNORMAL LOW (ref 55.8–66.1)
Alpha-2-Globulin: 14.2 % — ABNORMAL HIGH (ref 7.1–11.8)
Beta Globulin: 6 % (ref 4.7–7.2)
Total Protein ELP: 6.3 g/dL (ref 6.0–8.3)

## 2010-09-19 ENCOUNTER — Encounter: Payer: Self-pay | Admitting: Internal Medicine

## 2010-09-20 ENCOUNTER — Encounter: Payer: Self-pay | Admitting: Internal Medicine

## 2010-09-20 ENCOUNTER — Encounter: Payer: Self-pay | Admitting: *Deleted

## 2010-09-20 ENCOUNTER — Ambulatory Visit (INDEPENDENT_AMBULATORY_CARE_PROVIDER_SITE_OTHER): Payer: Medicare Other | Admitting: Internal Medicine

## 2010-09-20 DIAGNOSIS — J45909 Unspecified asthma, uncomplicated: Secondary | ICD-10-CM

## 2010-09-20 MED ORDER — ALBUTEROL 90 MCG/ACT IN AERS: 2.0000 | INHALATION_SPRAY | Freq: Four times a day (QID) | RESPIRATORY_TRACT | Status: DC | PRN

## 2010-09-20 NOTE — Progress Notes (Signed)
Subjective:     Patient ID: [name], female   DOB: [birth year], 65 y.o.   MRN: [id number]  HPI  4 yobf remote minimal smoker quit completely in the 1970's with dtc asthma since 2007 eval for the first time by the pulmonary service during admit [facility] in July 2012  09/20/2010 f/u ov/[name] cc better breathing since discharge on symbicort 160 2 bid and tapering prednisone to be off by 8/4.  No purulent sputum. No need for saba daytime.  Sleeping ok without nocturnal  or early am exac of resp c/o's or need for noct saba.   Pt denies any significant sore throat, dysphagia, itching, sneezing,  nasal congestion or excess/ purulent secretions,  fever, chills, sweats, unintended wt loss, pleuritic or exertional cp, hempoptysis, orthopnea pnd or leg swelling.    Also denies any obvious fluctuation of symptoms with weather or environmental changes or other aggravating or alleviating factors.       Review of Systems     Objective:   Physical Exam    amb bf m inimally hoarse with classic psuedowheeze better with slow IC and purse lip on exp  wt 193 HEENT mild turbinate edema.  Oropharynx no thrush or excess pnd or cobblestoning.  No JVD or cervical adenopathy. Mild accessory muscle hypertrophy. Trachea midline, nl thryroid. Chest was hyperinflated by percussion with diminished breath sounds and moderate increased exp time without wheeze. Hoover sign positive at mid inspiration. Regular rate and rhythm without murmur gallop or rub or increase P2 or edema.  Abd: no hsm, nl excursion. Ext warm without cyanosis or clubbing.   Assessment:         Plan:

## 2010-09-20 NOTE — Patient Instructions (Addendum)
Work on Chemical engineer technique:  relax and gently blow all the way out then take a nice smooth deep breath back in, triggering the inhaler at same time you start breathing in.  Hold for up to 5 seconds if you can.  Rinse and gargle with water when done   If your mouth or throat starts to bother you,   I suggest you time the inhaler to your dental care and after using the inhaler(s) brush teeth and tongue with a baking soda containing toothpaste and when you rinse this out, gargle with it first to see if this helps your mouth and throat.   If still bothered by throat irritation ok to use space.  Continue symbicort 160 Take 2 puffs first thing in am and then another 2 puffs about 12 hours later.   Protonix 40 mg Take 30-60 min before first meal of the day and Pepcid 20 mg on at bedtime  Only use the tramadol if coughing  Finish tapering prednisone off and call me right away if your breathing worsens and you have to use your rescue inhlaler (ventolin 1st, nebulizer second)  GERD (REFLUX)  is an extremely common cause of respiratory symptoms, many times with no significant heartburn at all.    It can be treated with medication, but also with lifestyle changes including avoidance of late meals, excessive alcohol, smoking cessation, and avoid fatty foods, chocolate, peppermint, colas, red wine, and acidic juices such as orange juice.  NO MINT OR MENTHOL PRODUCTS SO NO COUGH DROPS  USE SUGARLESS CANDY INSTEAD (jolley ranchers or Stover's)  NO OIL BASED VITAMINS   Please schedule a follow up office visit in 2 weeks, sooner if needed  Bring all medications with you separated into maintenance vs as needed

## 2010-09-21 DIAGNOSIS — J45909 Unspecified asthma, uncomplicated: Secondary | ICD-10-CM | POA: Insufficient documentation

## 2010-09-21 NOTE — Assessment & Plan Note (Signed)
DDX of  difficult airways managment all start with A and  include Adherence, Ace Inhibitors, Acid Reflux, Active Sinus Disease, Alpha 1 Antitripsin deficiency, Anxiety masquerading as Airways dz,  ABPA,  allergy(esp in young), Aspiration (esp in elderly), Adverse effects of DPI,  Active smokers, plus two Bs  = Bronchiectasis and Beta blocker use..and one C= CHF   Adherence is always the initial "prime suspect" and is a multilayered concern that requires a "trust but verify" approach in every patient - starting with knowing how to use medications, especially inhalers, correctly, keeping up with refills and understanding the fundamental difference between maintenance and prns vs those medications only taken for a very short course and then stopped and not refilled. The proper method of use, as well as anticipated side effects, of this metered-dose inhaler are discussed and demonstrated to the patient. Improved to 75% with coaching.  ? Acid reflux: reviewed emprical rx  ? Sinus dz > exlcuded 09/12/10  ? Anxiety> dx of exclusion  Needs pft's and maybe a look at her upper airway directly at some point.

## 2010-10-04 NOTE — Discharge Summary (Signed)
[name], [name]             ACCOUNT NO.:  [id number]  MEDICAL RECORD NO.:  [id number]  LOCATION:  1503                         FACILITY:  [facility]  PHYSICIAN:  Terri Dalton. Sherene Sires, MD, FCCPDATE OF BIRTH:  03/04/1945  DATE OF ADMISSION:  09/05/2010 DATE OF DISCHARGE:  09/14/2010                              DISCHARGE SUMMARY   DISCHARGE DIAGNOSIS:  Asthmatic exacerbation in the setting of probable sinusitis and secondary purulent bronchitis, further complicated by acid reflux disease and probable laryngopharyngeal reflux.  PROCEDURES:  None.  LABORATORY DATA:  Date September 11, 2010, folate 8, B12 978, total iron binding capacity 253, hemoglobin 11.1, hematocrit 36.2, platelet count 231,000, white blood cell count 10.6.  Blood chemistry September 10, 2010, sodium 138, potassium 3.9, chloride 101, CO2 32, glucose 115, BUN 17, creatinine 0.92.  DIAGNOSTIC EVALUATION:  Modified barium swallow, this is negative for aspiration esophagram.  This was also carried out on July 23, this was a negative study.  CT sinuses, September 12, 2010 demonstrated mild mucosal inflammation in the maxillary, ethmoid and sphenoid sinuses.  Chest x- ray on July 18 was without acute process.  BRIEF HISTORY:  This is a 65-year African-American female with a minimal smoking history but chronic asthma with seven admits since 2003 for asthmatic flares.  She is intubated back in 1995.  She presented on July 16 with 24 hours of increasing shortness of breath and wheezing.  She was initially placed on noninvasive positive pressure ventilation. Critical care was asked to assume her care.  HOSPITAL COURSE BY DISCHARGE DIAGNOSIS:  Asthmatic exacerbation, most likely precipitated by acute bacterial sinusitis with resultant purulent bronchitis.  This is further complicated by poorly-controlled reflux disease and resultant laryngopharyngeal type reflux.  She was admitted initially to the intensive care.  Therapeutic  interventions included noninvasive positive pressure ventilation, inhaled bronchodilators, systemic steroids, empiric antibiotics and supplemental oxygen.  She continues to improve over the course of her hospitalization.  It did not appear clear that there was a significant upper airway contributing factor to her disease process.  Because of this, her proton pump inhibitors were increased, she also had H2 blockade at bedtime. Additionally, a modified barium swallow was obtained.  This was negative, also esophagram was obtained.  This was negative for stricture or evidence of motility issues.  She does have a history consistent with reflux disease, and certainly is at high risk for laryngopharyngeal type reflux which is believed to be a large contributing factor.  Ultimately, she met maximum benefit from hospital care as of September 14, 2010, now ready for discharge with close outpatient followup.  She will be discharged to home with recommendations and instructions to initiate Symbicort for maintenance asthmatic therapy.  She will continue her short-acting beta-agonist on a p.r.n. basis.  She will complete 8 more days of Levaquin for treatment of sinusitis, she has also been prescribed Nasonex for postnasal drip.  She has been instructed to continue pantoprazole on twice daily basis, and she will continue a slow prednisone taper.  She will follow up with Dr. Sandrea Benitez on Tuesday, July 31, at 9:30 p.m.  DISCHARGE INSTRUCTIONS:  Diet as tolerated.  Increase activity as tolerated.  DISCHARGE MEDICATIONS:  1. Symbicort 160/4.5 two puffs twice a day. 2. Pepcid 20 mg daily at bedtime. 3. Mucinex DM 2 tablets twice a day. 4. Levofloxacin 750 mg daily for 8 days, then stop. 5. Singular 10 mg daily. 6. Nasonex 2 sprays each nostril daily. 7. Pantoprazole 40 mg twice a day. 8. Prednisone 10 mg tab 6 tablets for 2 days, then 5 tablets for 2     days, then 4 tablets for 2 days, then 3 tablets  for 2 days, then 2     tablets for 2 days, then 1 tablet for 2 days, then stop. 9. Tramadol 50 mg as needed for cough 4 times a day. 10.Albuterol 2.5 mg every 4 hours as needed for shortness of breath or     ProAir 2 puffs every 6 hours as needed.  DISPOSITION:  [name] has met maximum benefit from inpatient care. She is now medically cleared for discharge and followup as noted.     Zenia Resides, NP   ______________________________ Terri Dalton. Sherene Sires, MD, FCCP    PB/MEDQ  D:  09/14/2010  T:  09/14/2010  Job:  [id number]  Electronically Signed by Zenia Resides NP on 10/04/2010 03:16:59 PM Electronically Signed by Terri Hughs MD FCCP on 10/04/2010 05:07:36 PM

## 2010-10-06 ENCOUNTER — Encounter: Payer: Self-pay | Admitting: Internal Medicine

## 2010-10-06 ENCOUNTER — Ambulatory Visit (INDEPENDENT_AMBULATORY_CARE_PROVIDER_SITE_OTHER): Payer: Medicare Other | Admitting: Internal Medicine

## 2010-10-06 DIAGNOSIS — J45909 Unspecified asthma, uncomplicated: Secondary | ICD-10-CM

## 2010-10-06 NOTE — Assessment & Plan Note (Signed)
DDX of  difficult airways managment all start with A and  include Adherence, Ace Inhibitors, Acid Reflux, Active Sinus Disease, Alpha 1 Antitripsin deficiency, Anxiety masquerading as Airways dz,  ABPA,  allergy(esp in young), Aspiration (esp in elderly), Adverse effects of DPI,  Active smokers, plus two Bs  = Bronchiectasis and Beta blocker use..and one C= CHF  Adherence is always the initial "prime suspect" and is a multilayered concern that requires a "trust but verify" approach in every patient - starting with knowing how to use medications, especially inhalers, correctly, keeping up with refills and understanding the fundamental difference between maintenance and prns vs those medications only taken for a very short course and then stopped and not refilled. The proper method of use, as well as anticipated side effects, of this metered-dose inhaler are discussed and demonstrated to the patient. Was 75% at baseline with too fast Ti.

## 2010-10-06 NOTE — Patient Instructions (Addendum)
Work on Musician technique:  relax and gently blow all the way out then take a nice smooth deep breath back in, triggering the inhaler at same time you start breathing in.  Hold for up to 5 seconds if you can.  Rinse and gargle with water when done  If your mouth or throat starts to bother you,   I suggest you time the inhaler to your dental care and after using the inhaler(s) brush teeth and tongue with a baking soda containing toothpaste and when you rinse this out, gargle with it first to see if this helps your mouth and throat.     Please schedule a follow up office visit in 4 weeks, sooner if needed with PFT's

## 2010-10-06 NOTE — Progress Notes (Signed)
Subjective:     Patient ID: [name], female   DOB: 13-Aug-1945, 65 y.o.   MRN: [id number]  HPI  19 yobf remote minimal smoker quit completely in the 1970's with dtc asthma since 2007 eval for the first time by the pulmonary service during admit [facility] in July 2012  09/20/2010 f/u ov/Jeannia Tatro cc better breathing since discharge on symbicort 160 2 bid and tapering prednisone to be off by 8/4.  No purulent sputum. No need for saba daytime.   rec  GERD diet Work on Chemical engineer technique:  .Continue symbicort 160 Take 2 puffs first thing in am and then another 2 puffs about 12 hours later.  Protonix 40 mg Take 30-60 min before first meal of the day and Pepcid 20 mg on at bedtime Only use the tramadol if coughing Finish tapering prednisone off   10/06/2010 f/u ov/Rayford Williamsen cc doe only with steps.  Sleeping ok without nocturnal  or early am exac of resp c/o's or need for noct saba.  No cough  Pt denies any significant sore throat, dysphagia, itching, sneezing,  nasal congestion or excess/ purulent secretions,  fever, chills, sweats, unintended wt loss, pleuritic or exertional cp, hempoptysis, orthopnea pnd or leg swelling.    Also denies any obvious fluctuation of symptoms with weather or environmental changes or other aggravating or alleviating factors.       Objective:   Physical Exam    amb bf no longer sign hoarse and now very minimal  psuedowheeze  wt 193 > 192  10/06/10 HEENT mild turbinate edema.  Oropharynx no thrush or excess pnd or cobblestoning.  No JVD or cervical adenopathy. Mild accessory muscle hypertrophy. Trachea midline, nl thryroid. Chest was hyperinflated by percussion with diminished breath sounds and moderate increased exp time without wheeze. Hoover sign positive at mid inspiration. Regular rate and rhythm without murmur gallop or rub or increase P2 or edema.  Abd: no hsm, nl excursion. Ext warm without cyanosis or clubbing.   Assessment:         Plan:

## 2010-10-07 ENCOUNTER — Encounter: Payer: Self-pay | Admitting: Internal Medicine

## 2010-11-07 ENCOUNTER — Telehealth: Payer: Self-pay | Admitting: *Deleted

## 2010-11-07 NOTE — Telephone Encounter (Signed)
Per last OV note pt was to have ROV with PFT. I ATC the pt at home number, NA no voicemail. Cell numbers are both disconnected. Pt needs to r/s appt with PFT. Carron Curie, CMA

## 2010-11-08 ENCOUNTER — Encounter: Payer: Self-pay | Admitting: Internal Medicine

## 2010-11-08 ENCOUNTER — Ambulatory Visit (INDEPENDENT_AMBULATORY_CARE_PROVIDER_SITE_OTHER): Payer: Medicare Other | Admitting: Internal Medicine

## 2010-11-08 DIAGNOSIS — J45909 Unspecified asthma, uncomplicated: Secondary | ICD-10-CM

## 2010-11-08 NOTE — Progress Notes (Signed)
Subjective:     Patient ID: [name], female   DOB: 05-05-1945, 66 y.o.   MRN: [id number]  HPI  67 yobf remote minimal smoker quit completely in the 1970's with dtc asthma since 2007 eval for the first time by the pulmonary service during admit [facility] in July 2012  09/20/2010 f/u ov/[name] cc better breathing since discharge on symbicort 160 2 bid and tapering prednisone to be off by 8/4.  No purulent sputum. No need for saba daytime.   rec  GERD diet Work on Chemical engineer technique:  .Continue symbicort 160 Take 2 puffs first thing in am and then another 2 puffs about 12 hours later.  Protonix 40 mg Take 30-60 min before first meal of the day and Pepcid 20 mg on at bedtime Only use the tramadol if coughing Finish tapering prednisone off   11/08/2010 f/u ov/[name] cc doe only with steps.  Sleeping ok without nocturnal  or early am exac of resp c/o's or need for noct saba.  No cough rec Work on Musician technique:    11/08/2010 f/u ov/[name] cc cough and sob completely gone, no need for any saba daytime,typically has troubles this time of year but now on symbicort 160 2bid / singulair/ ppi/h2hs and doing great.  Sleeping ok without nocturnal  or early am exac of resp c/o's or need for noct saba.   Pt denies any significant sore throat, dysphagia, itching, sneezing,  nasal congestion or excess/ purulent secretions,  fever, chills, sweats, unintended wt loss, pleuritic or exertional cp, hempoptysis, orthopnea pnd or leg swelling.    Also denies any obvious fluctuation of symptoms with weather or environmental changes or other aggravating or alleviating factors.       Objective:   Physical Exam  amb bf no longer sign hoarse and now very minimal  psuedowheeze wt   192  10/06/10 > 11/08/2010  192  HEENT mild turbinate edema.  Oropharynx no thrush or excess pnd or cobblestoning.  No JVD or cervical adenopathy. Mild accessory muscle hypertrophy. Trachea midline, nl thryroid.  Chest was hyperinflated by percussion with diminished breath sounds and moderate increased exp time without wheeze. Hoover sign positive at mid inspiration. Regular rate and rhythm without murmur gallop or rub or increase P2 or edema.  Abd: no hsm, nl excursion. Ext warm without cyanosis or clubbing.    Assessment:         Plan:

## 2010-11-08 NOTE — Patient Instructions (Signed)
Try off the singulair to see if it makes any difference in your respiratory or nasal symptoms once the first frost comes.  Eliminate the protonix before supper but continue the dose before bfast and take pepcid at bedtime  Please schedule a follow up visit in 3 months but call sooner if needed

## 2010-11-09 ENCOUNTER — Encounter: Payer: Self-pay | Admitting: Internal Medicine

## 2010-11-09 NOTE — Assessment & Plan Note (Signed)
All goals of chronic asthma control met including optimal function and elimination of symptoms with minimal need for rescue therapy.  Contingencies discussed in full including contacting this office immediately if not controlling the symptoms using the rule of two's.     Ok to begin to taper ppi and stop singulair p allergy season  The proper method of use, as well as anticipated side effects, of this metered-dose inhaler are discussed and demonstrated to the patient. Improved now to 90%

## 2010-11-11 LAB — CULTURE, BLOOD (ROUTINE X 2)
Culture: NO GROWTH
Culture: NO GROWTH

## 2010-11-11 LAB — DIFFERENTIAL
Basophils Absolute: 0
Basophils Relative: 0
Eosinophils Absolute: 0.1
Eosinophils Relative: 1
Lymphocytes Relative: 4 — ABNORMAL LOW
Lymphs Abs: 0.4 — ABNORMAL LOW
Monocytes Absolute: 0.2
Monocytes Relative: 2 — ABNORMAL LOW
Neutro Abs: 10.4 — ABNORMAL HIGH
Neutrophils Relative %: 93 — ABNORMAL HIGH

## 2010-11-11 LAB — COMPREHENSIVE METABOLIC PANEL
ALT: 17
AST: 19
Albumin: 3.4 — ABNORMAL LOW
Alkaline Phosphatase: 104
BUN: 6
CO2: 26
Calcium: 8.6
Chloride: 106
Creatinine, Ser: 0.8
GFR calc Af Amer: >60
GFR calc non Af Amer: >60
Glucose, Bld: 145 — ABNORMAL HIGH
Potassium: 2.8 — ABNORMAL LOW
Sodium: 141
Total Bilirubin: 0.5
Total Protein: 6.8

## 2010-11-11 LAB — BASIC METABOLIC PANEL
BUN: 6
CO2: 30
CO2: 30
Calcium: 9.2
Chloride: 105
Creatinine, Ser: 0.73
GFR calc Af Amer: >60
GFR calc non Af Amer: >60
Glucose, Bld: 109 — ABNORMAL HIGH
Glucose, Bld: 125 — ABNORMAL HIGH
Potassium: 4.6
Sodium: 140
Sodium: 141

## 2010-11-11 LAB — POCT I-STAT 3, ART BLOOD GAS (G3+)
O2 Saturation: 95
Patient temperature: 100.5
TCO2: 29

## 2010-11-11 LAB — CBC
HCT: 31.9 — ABNORMAL LOW
HCT: 32.7 — ABNORMAL LOW
HCT: 34.8 — ABNORMAL LOW
Hemoglobin: 10.2 — ABNORMAL LOW
Hemoglobin: 10.7 — ABNORMAL LOW
Hemoglobin: 11.3 — ABNORMAL LOW
MCHC: 32.3
MCHC: 32.7
MCV: 78.7
MCV: 78.9
Platelets: 229
RBC: 4.15
RBC: 4.43
RDW: 15.6 — ABNORMAL HIGH
RDW: 16.1 — ABNORMAL HIGH
WBC: 11.1 — ABNORMAL HIGH
WBC: 8.6

## 2010-11-11 LAB — IRON AND TIBC: UIBC: 278

## 2010-11-11 LAB — URINE MICROSCOPIC-ADD ON

## 2010-11-11 LAB — URINALYSIS, ROUTINE W REFLEX MICROSCOPIC
Bilirubin Urine: NEGATIVE
Hgb urine dipstick: NEGATIVE
Ketones, ur: NEGATIVE
Specific Gravity, Urine: 1.007
Urobilinogen, UA: 0.2

## 2010-11-11 LAB — RETICULOCYTES
RBC.: 4.45
Retic Ct Pct: 1.2

## 2010-11-11 LAB — B-NATRIURETIC PEPTIDE (CONVERTED LAB): Pro B Natriuretic peptide (BNP): 40

## 2010-11-11 LAB — FERRITIN: Ferritin: 31 (ref 10–291)

## 2010-11-11 LAB — FOLATE: Folate: 10.7

## 2010-11-15 LAB — URINALYSIS, ROUTINE W REFLEX MICROSCOPIC
Hgb urine dipstick: NEGATIVE
Ketones, ur: NEGATIVE
Protein, ur: NEGATIVE
Urobilinogen, UA: 0.2

## 2010-11-15 LAB — BASIC METABOLIC PANEL
CO2: 28
Calcium: 9.2
Creatinine, Ser: 0.76
Glucose, Bld: 90

## 2010-11-15 LAB — CBC
MCHC: 32.7
RDW: 16.9 — ABNORMAL HIGH

## 2010-11-15 LAB — URINE MICROSCOPIC-ADD ON

## 2010-11-22 NOTE — Telephone Encounter (Signed)
Done. [name], CMA  

## 2011-02-08 ENCOUNTER — Ambulatory Visit (INDEPENDENT_AMBULATORY_CARE_PROVIDER_SITE_OTHER): Payer: Medicare Other | Admitting: Internal Medicine

## 2011-02-08 ENCOUNTER — Encounter: Payer: Self-pay | Admitting: Internal Medicine

## 2011-02-08 DIAGNOSIS — Z23 Encounter for immunization: Secondary | ICD-10-CM

## 2011-02-08 DIAGNOSIS — J45909 Unspecified asthma, uncomplicated: Secondary | ICD-10-CM

## 2011-02-08 NOTE — Patient Instructions (Signed)
Ok try singulair off to see if it makes any difference  Please schedule a follow up visit in 3 months but call sooner if needed

## 2011-02-08 NOTE — Assessment & Plan Note (Signed)
All goals of chronic asthma control met including optimal function and elimination of symptoms with minimal need for rescue therapy.  Contingencies discussed in full including contacting this office immediately if not controlling the symptoms using the rule of two's.     See instructions for specific recommendations which were reviewed directly with the patient who was given a copy with highlighter outlining the key components.  

## 2011-02-08 NOTE — Progress Notes (Signed)
Addended by: Julaine Hua on: 02/08/2011 05:06 PM   Modules accepted: Orders

## 2011-02-08 NOTE — Progress Notes (Signed)
Subjective:     Patient ID: [name], female   DOB: [birth year], 65 y.o.   MRN: [id number]  HPI  65 yobf remote minimal smoker quit completely in the 1970's with dtc asthma since 2007 eval for the first time by the pulmonary service during admit [facility] in July 2012  09/20/2010 f/u ov/Riggins Cisek cc better breathing since discharge on symbicort 160 2 bid and tapering prednisone to be off by 8/4.  No purulent sputum. No need for saba daytime.   rec  GERD diet Work on Chemical engineer technique:  .Continue symbicort 160 Take 2 puffs first thing in am and then another 2 puffs about 12 hours later.  Protonix 40 mg Take 30-60 min before first meal of the day and Pepcid 20 mg on at bedtime Only use the tramadol if coughing Finish tapering prednisone off   11/08/2010 f/u ov/Remas Sobel cc doe only with steps.  Sleeping ok without nocturnal  or early am exac of resp c/o's or need for noct saba.  No cough rec Work on Musician technique:    11/08/2010 f/u ov/Zuzu Befort cc cough and sob completely gone, no need for any saba daytime,typically has troubles this time of year but now on symbicort 160 2bid / singulair/ ppi/h2hs and doing great. rec Try off the singulair to see if it makes any difference in your respiratory or nasal symptoms once the first frost comes. Eliminate the protonix before supper but continue the dose before bfast and take pepcid at bedtime     02/08/2011 f/u ov/Brannon Decaire cc Pt states breathing doing well, no complaints today no need for daytime saba still on symbicort 160 2 bid and singulair, nasonex. No cough or urge to clear throat    Sleeping ok without nocturnal  or early am exac of resp c/o's or need for noct saba.   Pt denies any significant sore throat, dysphagia, itching, sneezing,  nasal congestion or excess/ purulent secretions,  fever, chills, sweats, unintended wt loss, pleuritic or exertional cp, hempoptysis, orthopnea pnd or leg swelling.    Also denies any obvious  fluctuation of symptoms with weather or environmental changes or other aggravating or alleviating factors.       Objective:   Physical Exam  amb bf no longer sign hoarse and no pseudowheeze at all   wt   192  10/06/10 > 11/08/2010  192 > 02/08/2011 201  HEENT mild turbinate edema.  Oropharynx no thrush or excess pnd or cobblestoning.  No JVD or cervical adenopathy. Mild accessory muscle hypertrophy. Trachea midline, nl thryroid. Chest was hyperinflated by percussion with diminished breath sounds and moderate increased exp time without wheeze. Hoover sign positive at mid inspiration. Regular rate and rhythm without murmur gallop or rub or increase P2 or edema.  Abd: no hsm, nl excursion. Ext warm without cyanosis or clubbing.    Assessment:         Plan:

## 2011-04-21 ENCOUNTER — Ambulatory Visit (INDEPENDENT_AMBULATORY_CARE_PROVIDER_SITE_OTHER): Payer: Medicare Other | Admitting: Family Medicine

## 2011-04-21 ENCOUNTER — Ambulatory Visit: Payer: Medicare Other

## 2011-04-21 DIAGNOSIS — R05 Cough: Secondary | ICD-10-CM

## 2011-04-21 DIAGNOSIS — R059 Cough, unspecified: Secondary | ICD-10-CM

## 2011-04-21 DIAGNOSIS — M542 Cervicalgia: Secondary | ICD-10-CM

## 2011-04-21 DIAGNOSIS — J45901 Unspecified asthma with (acute) exacerbation: Secondary | ICD-10-CM

## 2011-04-21 LAB — POCT CBC
MCH, POC: 24.1 pg — AB (ref 27–31.2)
MCHC: 30.7 g/dL — AB (ref 31.8–35.4)
MID (cbc): 0.7 (ref 0–0.9)
MPV: 8.6 fL (ref 0–99.8)
POC LYMPH PERCENT: 18.9 %L (ref 10–50)
POC MID %: 4.6 %M (ref 0–12)
Platelet Count, POC: 294 10*3/uL (ref 142–424)
RBC: 5.02 M/uL (ref 4.04–5.48)
RDW, POC: 16.1 %
WBC: 14.7 10*3/uL — AB (ref 4.6–10.2)

## 2011-04-21 MED ORDER — TRAMADOL HCL 50 MG PO TABS: 50.0000 mg | ORAL_TABLET | Freq: Four times a day (QID) | ORAL | Status: DC | PRN

## 2011-04-21 MED ORDER — LEVOFLOXACIN 500 MG PO TABS: 500.0000 mg | ORAL_TABLET | Freq: Every day | ORAL | Status: AC

## 2011-04-21 MED ORDER — PREDNISONE 20 MG PO TABS: ORAL_TABLET | ORAL | Status: DC

## 2011-04-21 MED ORDER — METHOCARBAMOL 750 MG PO TABS: 750.0000 mg | ORAL_TABLET | Freq: Four times a day (QID) | ORAL | Status: AC

## 2011-04-21 NOTE — Progress Notes (Signed)
Filed Vitals:   04/21/11 1259  BP: 133/83  Pulse: 92  Temp: 98.6 F (37 C)  Resp: 18

## 2011-04-21 NOTE — Patient Instructions (Signed)
Continue using ice and/or heat on the shoulder.  Drink lots of water to keep the secretions in your lung scan.  Return if worse or not improving.

## 2011-04-21 NOTE — Progress Notes (Signed)
Subjective: Patient woke up about Wednesday with her right side of her neck hurting and her shoulder. Is continued to hurt the last few days. She put patches on it and used hot and cold without relief. Nothing helps. She's been coughing, starting early in the week. It has been a little worse. She has a history of asthma but she has not been wheezing much. She uses an inhaler on a regular basis. There were no injuries to her neck. She is not employed.  Review of systems reveals her ears are been stuffy, she's had upper start her congestion. Throat is not sore. No GI or GU complaints. Musculoskeletal aching all over.  Objective: Alert oriented lady in no acute distress at this time. Her TMs are normal. Throat was clear. Neck supple without significant nodes. Is very tender in the right trapezius area on the right side of the neck. It hurts to move the back around. Her motor strength is good. Chest is congested with a lot of rhonchi and wheezing bilaterally. She does have some cough. Heart was regular without murmurs. No edema. [facility] reading (PRIMARY) by  Dr. Alwyn Ren x-ray is normal.. Results for orders placed in visit on 04/21/11  POCT CBC      Component Value Range   WBC 14.7 (*) 4.6 - 10.2 (K/uL)   Lymph, poc 2.8  0.6 - 3.4    POC LYMPH PERCENT 18.9  10 - 50 (%L)   MID (cbc) 0.7  0 - 0.9    POC MID % 4.6  0 - 12 (%M)   POC Granulocyte 11.2 (*) 2 - 6.9    Granulocyte percent 76.5  37 - 80 (%G)   RBC 5.02  4.04 - 5.48 (M/uL)   Hemoglobin 12.1 (*) 12.2 - 16.2 (g/dL)   HCT, POC 16.1  09.6 - 47.9 (%)   MCV 78.5 (*) 80 - 97 (fL)   MCH, POC 24.1 (*) 27 - 31.2 (pg)   MCHC 30.7 (*) 31.8 - 35.4 (g/dL)   RDW, POC 04.5     Platelet Count, POC 294  142 - 424 (K/uL)   MPV 8.6  0 - 99.8 (fL)    Assessment: Right cervical strain Asthmatic bronchitis Cough Body aches  Plan: Will get an x-ray of her chest to make sure she does have a right upper lung infection. Also check CBC. Then will decide  treatment.  With the asthmatic bronchitisesent, I will treat her for that with steroids. I will also give some anti-inflammatories for her neck and some pain reliever.

## 2011-05-09 ENCOUNTER — Ambulatory Visit (INDEPENDENT_AMBULATORY_CARE_PROVIDER_SITE_OTHER): Payer: Medicare Other | Admitting: Internal Medicine

## 2011-05-09 ENCOUNTER — Encounter: Payer: Self-pay | Admitting: Internal Medicine

## 2011-05-09 DIAGNOSIS — J45909 Unspecified asthma, uncomplicated: Secondary | ICD-10-CM

## 2011-05-09 DIAGNOSIS — T39095A Adverse effect of salicylates, initial encounter: Secondary | ICD-10-CM

## 2011-05-09 DIAGNOSIS — Z888 Allergy status to other drugs, medicaments and biological substances status: Secondary | ICD-10-CM

## 2011-05-09 NOTE — Assessment & Plan Note (Addendum)
-   Sinus CT 09/12/10 Previous bilateral nasoantral windows. Mild mucosal inflammation,  particularly notable in the maxillary, ethmoid and sphenoid  sinuses. No advanced sinusitis or free fluid. - Barium swallow nl 09/12/10 - HFA 90% 11/08/10  Pseudowheeze slt worse off Protonix suggests  Component of  Upper airway cough syndrome, so named because it's frequently impossible to sort out how much is  CR/sinusitis with freq throat clearing (which can be related to primary GERD)   vs  causing  secondary (" extra esophageal")  GERD from wide swings in gastric pressure that occur with throat clearing, often  promoting self use of mint and menthol lozenges that reduce the lower esophageal sphincter tone and exacerbate the problem further in a cyclical fashion.   These are the same pts (now being labeled as having "irritable larynx syndrome" by some cough centers) who not infrequently have a history of having failed to tolerate ace inhibitors,  dry powder inhalers or biphosphonates or report having atypical reflux symptoms that don't respond to standard doses of PPI , and are easily confused as having aecopd or asthma flares by even experienced allergists/ pulmonologists.   Therefore needs low threshold to rx with max ppi for any flare, reviewed  The other issue is whether she has aspirin sensitive asa, so low threshold to add back singulair if flares and avoid asa/ nsaids> discussed separately

## 2011-05-09 NOTE — Progress Notes (Signed)
Subjective:     Patient ID: [name], female   DOB: [birth year]    MRN: [id number]  HPI  10 yobf remote minimal smoker quit completely in the 1970's with dtc asthma since 2007 eval for the first time by the pulmonary service during admit [facility] in July 2012 with prominent upper airway "wheezing"  09/20/2010 f/u ov/[name] cc better breathing since discharge on symbicort 160 2 bid and tapering prednisone to be off by 8/4.  No purulent sputum. No need for saba daytime.   rec  GERD diet Work on Chemical engineer technique:  .Continue symbicort 160 Take 2 puffs first thing in am and then another 2 puffs about 12 hours later.  Protonix 40 mg Take 30-60 min before first meal of the day and Pepcid 20 mg on at bedtime Only use the tramadol if coughing Finish tapering prednisone off   11/08/2010 f/u ov/[name] cc doe only with steps.  Sleeping ok without nocturnal  or early am exac of resp c/o's or need for noct saba.  No cough rec Work on Musician technique:    11/08/2010 f/u ov/[name] cc cough and sob completely gone, no need for any saba daytime,typically has troubles this time of year but now on symbicort 160 2bid / singulair/ ppi/h2hs and doing great. rec Try off the singulair to see if it makes any difference in your respiratory or nasal symptoms once the first frost comes. Eliminate the protonix before supper but continue the dose before bfast and take pepcid at bedtime     02/08/2011 f/u ov/[name] did not stop singulair  cc  breathing doing well, no complaints - no need for daytime saba still on symbicort 160 2 bid and singulair, nasonex. No cough or urge to clear throat rec Ok try singulair off to see if it makes any difference  05/09/2011 f/u ov/[name] ok off singulair but also  off protonix in am > some wheezing but no sob with exertion or sleeping,  No cough or need for daytime saba, no purulent sputum or active sinus or hb complaints. Some positional neck pain can't take asa due  to "throat swelling"  Sleeping ok without nocturnal  or early am exac of resp c/o's or need for noct saba.   Pt denies any significant sore throat, dysphagia, itching, sneezing,  nasal congestion or excess/ purulent secretions,  fever, chills, sweats, unintended wt loss, pleuritic or exertional cp, hempoptysis, orthopnea pnd or leg swelling.    Also denies any obvious fluctuation of symptoms with weather or environmental changes or other aggravating or alleviating factors.       Objective:   Physical Exam  amb bf no longer sign hoarse but slt  pseudowheeze at all   wt   192  10/06/10 > 11/08/2010  192 > 02/08/2011 201> 05/09/2011  196  HEENT mild turbinate edema.  Oropharynx no thrush or excess pnd or cobblestoning.  No JVD or cervical adenopathy. Mild accessory muscle hypertrophy. Trachea midline, nl thryroid. Chest was hyperinflated by percussion with diminished breath sounds and moderate increased exp time without wheeze. Hoover sign positive at mid inspiration. Regular rate and rhythm without murmur gallop or rub or increase P2 or edema.  Abd: no hsm, nl excursion. Ext warm without cyanosis or clubbing.    CXR  05/09/2011 :  No active cardiopulmonary disease. Changes related to COPD are  noted.  Assessment:         Plan:

## 2011-05-09 NOTE — Patient Instructions (Signed)
If breathing worsens or any respiratory symptoms start back on protonix 40 mg  Take 30-60 min before first meal of the day    Please schedule a follow up visit in 3 months but call sooner if needed with pfts

## 2011-05-13 DIAGNOSIS — Z888 Allergy status to other drugs, medicaments and biological substances status: Secondary | ICD-10-CM | POA: Insufficient documentation

## 2011-05-13 DIAGNOSIS — Z886 Allergy status to analgesic agent status: Secondary | ICD-10-CM | POA: Insufficient documentation

## 2011-05-13 NOTE — Assessment & Plan Note (Signed)
Discussed avoidance of asa and nsaids Should be ok to rx with tramadol prn pain not relieved by tylenol

## 2011-08-09 ENCOUNTER — Encounter: Payer: Self-pay | Admitting: Internal Medicine

## 2011-08-09 ENCOUNTER — Ambulatory Visit (INDEPENDENT_AMBULATORY_CARE_PROVIDER_SITE_OTHER): Payer: Medicare Other | Admitting: Internal Medicine

## 2011-08-09 VITALS — BP 148/80 | HR 67 | Temp 98.3°F | Ht 69.0 in | Wt 198.0 lb

## 2011-08-09 DIAGNOSIS — J45909 Unspecified asthma, uncomplicated: Secondary | ICD-10-CM

## 2011-08-09 LAB — PULMONARY FUNCTION TEST

## 2011-08-09 NOTE — Progress Notes (Signed)
PFT done today. 

## 2011-08-09 NOTE — Progress Notes (Signed)
Subjective:     Patient ID: [name], female   DOB: 12-14-1945    MRN: [id number]  HPI  13  yobf remote minimal smoker quit completely in the 1970's with dtc asthma since 2007 eval for the first time by the pulmonary service during admit [facility] in July 2012 with prominent upper airway "wheezing"  09/20/2010 f/u ov/Urho Rio cc better breathing since discharge on symbicort 160 2 bid and tapering prednisone to be off by 8/4.  No purulent sputum. No need for saba daytime.   rec  GERD diet Work on Chemical engineer technique:  .Continue symbicort 160 Take 2 puffs first thing in am and then another 2 puffs about 12 hours later.  Protonix 40 mg Take 30-60 min before first meal of the day and Pepcid 20 mg on at bedtime Only use the tramadol if coughing Finish tapering prednisone off   11/08/2010 f/u ov/Janelys Glassner cc doe only with steps.  Sleeping ok without nocturnal  or early am exac of resp c/o's or need for noct saba.  No cough rec Work on Musician technique:    11/08/2010 f/u ov/Jayleana Colberg cc cough and sob completely gone, no need for any saba daytime,typically has troubles this time of year but now on symbicort 160 2bid / singulair/ ppi/h2hs and doing great. rec Try off the singulair to see if it makes any difference in your respiratory or nasal symptoms once the first frost comes. Eliminate the protonix before supper but continue the dose before bfast and take pepcid at bedtime     02/08/2011 f/u ov/Macon Sandiford did not stop singulair  cc  breathing doing well, no complaints - no need for daytime saba still on symbicort 160 2 bid and singulair, nasonex. No cough or urge to clear throat rec Ok try singulair off to see if it makes any difference > it didn't   05/09/2011 f/u ov/Kamori Kitchens ok off singulair but also  off protonix in am > some wheezing but no sob with exertion or sleeping,  No cough or need for daytime saba, no purulent sputum or active sinus or hb complaints. Some positional neck pain can't  take asa due to "throat swelling" rec If breathing worsens or any respiratory symptoms start back on protonix 40 mg  Take 30-60 min before first meal of the day  08/09/2011 f/u ov/Hasana Alcorta cc breathing improved, the best it's been in years. No cough or variability or need for daytime saba.  Sleeping ok without nocturnal  or early am exac of resp c/o's or need for noct saba.   Pt denies any significant sore throat, dysphagia, itching, sneezing,  nasal congestion or excess/ purulent secretions,  fever, chills, sweats, unintended wt loss, pleuritic or exertional cp, hempoptysis, orthopnea pnd or leg swelling.    Also denies any obvious fluctuation of symptoms with weather or environmental changes or other aggravating or alleviating factors.       Objective:   Physical Exam  amb bf no longer sign hoarse but slt  pseudowheeze variably present   wt   192  10/06/10 > 11/08/2010  192 > 02/08/2011 201> 05/09/2011  196> 08/09/2011  198  HEENT mild turbinate edema.  Oropharynx no thrush or excess pnd or cobblestoning.  No JVD or cervical adenopathy. Mild accessory muscle hypertrophy. Trachea midline, nl thryroid. Chest was hyperinflated by percussion with diminished breath sounds and moderate increased exp time without wheeze. Hoover sign positive at mid inspiration. Regular rate and rhythm without murmur gallop or rub or increase  P2 or edema.  Abd: no hsm, nl excursion. Ext warm without cyanosis or clubbing.    CXR  05/09/2011 :  No active cardiopulmonary disease. Changes related to COPD are  noted.  Assessment:         Plan:

## 2011-08-09 NOTE — Assessment & Plan Note (Signed)
-   Sinus CT 09/12/10 Previous bilateral nasoantral windows. Mild mucosal inflammation,  particularly notable in the maxillary, ethmoid and sphenoid  sinuses. No advanced sinusitis or free fluid. - Barium swallow nl 09/12/10 - HFA 90% 11/08/10 - PFTs 08/09/2011 FEV1  1.24 (50%) ratio 68 and no change p B2, DLCO 67 improves to 133 c/w minimal airflow obstruction  No significant residual airflow obstruction and  All goals of chronic asthma control met including optimal function and elimination of symptoms with minimal need for rescue therapy.  Contingencies discussed in full including contacting this office immediately if not controlling the symptoms using the rule of two's.     Pulmonary f/u prn

## 2011-08-09 NOTE — Patient Instructions (Addendum)
Symbicort is plan A  Plan B is your ventolin inhaler, only use if you need it  Plan C is nebulizer > ok to use if plan B fails for call appt here immediately  Please schedule a follow up visit in 3 months but call sooner if needed

## 2011-08-15 ENCOUNTER — Encounter: Payer: Self-pay | Admitting: Internal Medicine

## 2011-10-25 ENCOUNTER — Encounter: Payer: Self-pay | Admitting: Gastroenterology

## 2011-11-02 ENCOUNTER — Encounter: Payer: Self-pay | Admitting: Gastroenterology

## 2011-11-13 ENCOUNTER — Encounter: Payer: Self-pay | Admitting: Internal Medicine

## 2011-11-13 ENCOUNTER — Ambulatory Visit (INDEPENDENT_AMBULATORY_CARE_PROVIDER_SITE_OTHER): Payer: Medicare Other | Admitting: Internal Medicine

## 2011-11-13 VITALS — BP 122/78 | HR 92 | Temp 98.1°F | Ht 69.0 in | Wt 191.4 lb

## 2011-11-13 DIAGNOSIS — M542 Cervicalgia: Secondary | ICD-10-CM

## 2011-11-13 DIAGNOSIS — J45909 Unspecified asthma, uncomplicated: Secondary | ICD-10-CM

## 2011-11-13 MED ORDER — PANTOPRAZOLE SODIUM 40 MG PO TBEC: 40.0000 mg | DELAYED_RELEASE_TABLET | Freq: Every day | ORAL | Status: DC

## 2011-11-13 MED ORDER — BUDESONIDE-FORMOTEROL FUMARATE 160-4.5 MCG/ACT IN AERO: 2.0000 | INHALATION_SPRAY | Freq: Two times a day (BID) | RESPIRATORY_TRACT | Status: DC

## 2011-11-13 MED ORDER — FLUTICASONE PROPIONATE 50 MCG/ACT NA SUSP: 2.0000 | Freq: Every day | NASAL | Status: DC

## 2011-11-13 MED ORDER — TRAMADOL HCL 50 MG PO TABS: 50.0000 mg | ORAL_TABLET | Freq: Four times a day (QID) | ORAL | Status: DC | PRN

## 2011-11-13 NOTE — Assessment & Plan Note (Signed)
-   Sinus CT 09/12/10 Previous bilateral nasoantral windows. Mild mucosal inflammation,  particularly notable in the maxillary, ethmoid and sphenoid  sinuses. No advanced sinusitis or free fluid. - Barium swallow nl 09/12/10 - HFA 90% 11/08/10 - PFTs 08/09/2011 FEV1  1.24 (50%) ratio 68 and no change p B2, DLCO 67 improves to 133 c/w minimal airflow obstruction  All goals of chronic asthma control met including optimal function and elimination of symptoms with minimal need for rescue therapy.  Contingencies discussed in full including contacting this office immediately if not controlling the symptoms using the rule of two's.     F/u at this point can be q 6 months    Each maintenance medication was reviewed in detail including most importantly the difference between maintenance and as needed and under what circumstances the prns are to be used.  Please see instructions for details which were reviewed in writing and the patient given a copy.

## 2011-11-13 NOTE — Patient Instructions (Addendum)
No change in medications    Please schedule a follow up visit in 6  months but call sooner if needed  

## 2011-11-13 NOTE — Progress Notes (Signed)
Subjective:     Patient ID: NORIA BUGLIONE, female   DOB: [birth year]    MRN: [id number]  HPI  49  yobf remote minimal smoker quit completely in the 1970's with dtc asthma since 2007 eval for the first time by the pulmonary service during admit [facility] in July 2012 with prominent upper airway "wheezing"  09/20/2010 f/u ov/Wert cc better breathing since discharge on symbicort 160 2 bid and tapering prednisone to be off by 8/4.  No purulent sputum. No need for saba daytime.   rec  GERD diet Work on Chemical engineer technique:  .Continue symbicort 160 Take 2 puffs first thing in am and then another 2 puffs about 12 hours later.  Protonix 40 mg Take 30-60 min before first meal of the day and Pepcid 20 mg on at bedtime Only use the tramadol if coughing Finish tapering prednisone off   11/08/2010 f/u ov/Wert cc doe only with steps.  Sleeping ok without nocturnal  or early am exac of resp c/o's or need for noct saba.  No cough rec Work on Musician technique:    11/08/2010 f/u ov/Wert cc cough and sob completely gone, no need for any saba daytime,typically has troubles this time of year but now on symbicort 160 2bid / singulair/ ppi/h2hs and doing great. rec Try off the singulair to see if it makes any difference in your respiratory or nasal symptoms once the first frost comes. Eliminate the protonix before supper but continue the dose before bfast and take pepcid at bedtime  02/08/2011 f/u ov/Wert did not stop singulair  cc  breathing doing well, no complaints - no need for daytime saba still on symbicort 160 2 bid and singulair, nasonex. No cough or urge to clear throat rec Ok try singulair off to see if it makes any difference > it didn't   05/09/2011 f/u ov/Wert ok off singulair but also  off protonix in am > some wheezing but no sob with exertion or sleeping,  No cough or need for daytime saba, no purulent sputum or active sinus or hb complaints. Some positional neck pain can't take  asa due to "throat swelling" rec If breathing worsens or any respiratory symptoms start back on protonix 40 mg  Take 30-60 min before first meal of the day  08/09/2011 f/u ov/Wert cc breathing improved, the best it's been in years. No cough or variability or need for daytime saba.  Sleeping ok without nocturnal  or early am exac of resp c/o's or need for noct saba.   rec Symbicort is plan A  Plan B is your ventolin inhaler, only use if you need it  Plan C is nebulizer > ok to use if plan B fails for call appt here immediately  11/13/2011 f/u ov/Wert cc only uses bid symbicort 160 and no need for saba hfa or neb, No obvious daytime variabilty or assoc chronic cough or cp or chest tightness, subjective wheeze overt sinus or hb symptoms. No unusual exp hx     Sleeping ok without nocturnal  or early am exacerbation  of respiratory  c/o's or need for noct saba. Also denies any obvious fluctuation of symptoms with weather or environmental changes or other aggravating or alleviating factors except as outlined above   ROS  The following are not active complaints unless bolded sore throat, dysphagia, dental problems, itching, sneezing,  nasal congestion or excess/ purulent secretions, ear ache,   fever, chills, sweats, unintended wt loss, pleuritic or exertional cp,  hemoptysis,  orthopnea pnd or leg swelling, presyncope, palpitations, heartburn, abdominal pain, anorexia, nausea, vomiting, diarrhea  or change in bowel or urinary habits, change in stools or urine, dysuria,hematuria,  rash, arthralgias, visual complaints, headache, numbness weakness or ataxia or problems with walking or coordination,  change in mood/affect or memory.        Objective:   Physical Exam  amb bf no longer  Hoarse, no longer pseudowheezing  wt   192  10/06/10 > 11/08/2010  192 > 02/08/2011 201> 05/09/2011  196> 08/09/2011  198 >11/13/2011 191  HEENT mild turbinate edema.  Oropharynx no thrush or excess pnd or cobblestoning.  No  JVD or cervical adenopathy. Mild accessory muscle hypertrophy. Trachea midline, nl thryroid. Chest was hyperinflated by percussion with diminished breath sounds and moderate increased exp time without wheeze. Hoover sign positive at mid inspiration. Regular rate and rhythm without murmur gallop or rub or increase P2 or edema.  Abd: no hsm, nl excursion. Ext warm without cyanosis or clubbing.    CXR  05/09/2011 :  No active cardiopulmonary disease. Changes related to COPD are  noted.  Assessment:         Plan:

## 2011-11-16 ENCOUNTER — Ambulatory Visit (AMBULATORY_SURGERY_CENTER): Payer: Medicare Other | Admitting: *Deleted

## 2011-11-16 VITALS — Ht 69.75 in | Wt 188.0 lb

## 2011-11-16 DIAGNOSIS — Z1211 Encounter for screening for malignant neoplasm of colon: Secondary | ICD-10-CM

## 2011-11-16 MED ORDER — MOVIPREP 100 G PO SOLR: ORAL | Status: DC

## 2011-11-28 ENCOUNTER — Other Ambulatory Visit: Payer: Self-pay | Admitting: *Deleted

## 2011-11-28 MED ORDER — FLUTICASONE PROPIONATE 50 MCG/ACT NA SUSP: 2.0000 | Freq: Every day | NASAL | Status: DC

## 2011-11-28 MED ORDER — BUDESONIDE-FORMOTEROL FUMARATE 160-4.5 MCG/ACT IN AERO: 2.0000 | INHALATION_SPRAY | Freq: Two times a day (BID) | RESPIRATORY_TRACT | Status: DC

## 2011-11-28 MED ORDER — PANTOPRAZOLE SODIUM 40 MG PO TBEC: 40.0000 mg | DELAYED_RELEASE_TABLET | Freq: Every day | ORAL | Status: DC

## 2011-11-29 ENCOUNTER — Ambulatory Visit (AMBULATORY_SURGERY_CENTER): Payer: Medicare Other | Admitting: Gastroenterology

## 2011-11-29 ENCOUNTER — Encounter: Payer: Self-pay | Admitting: Gastroenterology

## 2011-11-29 VITALS — BP 144/49 | HR 63 | Temp 98.1°F | Resp 19 | Ht 69.0 in | Wt 188.0 lb

## 2011-11-29 DIAGNOSIS — D126 Benign neoplasm of colon, unspecified: Secondary | ICD-10-CM

## 2011-11-29 DIAGNOSIS — Z1211 Encounter for screening for malignant neoplasm of colon: Secondary | ICD-10-CM

## 2011-11-29 DIAGNOSIS — Z86010 Personal history of colonic polyps: Secondary | ICD-10-CM

## 2011-11-29 MED ORDER — SODIUM CHLORIDE 0.9 % IV SOLN: 500.0000 mL | INTRAVENOUS | Status: DC

## 2011-11-29 NOTE — Patient Instructions (Signed)
YOU HAD AN ENDOSCOPIC PROCEDURE TODAY AT THE [facility]: Refer to the procedure report that was given to you for any specific questions about what was found during the examination.  If the procedure report does not answer your questions, please call your gastroenterologist to clarify.  If you requested that your care partner not be given the details of your procedure findings, then the procedure report has been included in a sealed envelope for you to review at your convenience later.  YOU SHOULD EXPECT: Some feelings of bloating in the abdomen. Passage of more gas than usual.  Walking can help get rid of the air that was put into your GI tract during the procedure and reduce the bloating. If you had a lower endoscopy (such as a colonoscopy or flexible sigmoidoscopy) you may notice spotting of blood in your stool or on the toilet paper. If you underwent a bowel prep for your procedure, then you may not have a normal bowel movement for a few days.  DIET: Your first meal following the procedure should be a light meal and then it is ok to progress to your normal diet.  A half-sandwich or bowl of soup is an example of a good first meal.  Heavy or fried foods are harder to digest and may make you feel nauseous or bloated.  Likewise meals heavy in dairy and vegetables can cause extra gas to form and this can also increase the bloating.  Drink plenty of fluids but you should avoid alcoholic beverages for 24 hours.  ACTIVITY: Your care partner should take you home directly after the procedure.  You should plan to take it easy, moving slowly for the rest of the day.  You can resume normal activity the day after the procedure however you should NOT DRIVE or use heavy machinery for 24 hours (because of the sedation medicines used during the test).    SYMPTOMS TO REPORT IMMEDIATELY: A gastroenterologist can be reached at any hour.  During normal business hours, 8:30 AM to 5:00 PM Monday through Friday,  call [phone].  After hours and on weekends, please call the GI answering service at [phone] who will take a message and have the physician on call contact you.   Following lower endoscopy (colonoscopy or flexible sigmoidoscopy):  Excessive amounts of blood in the stool  Significant tenderness or worsening of abdominal pains  Swelling of the abdomen that is new, acute  Fever of 100F or higher    FOLLOW UP: If any biopsies were taken you will be contacted by phone or by letter within the next 1-3 weeks.  Call your gastroenterologist if you have not heard about the biopsies in 3 weeks.  Our staff will call the home number listed on your records the next business day following your procedure to check on you and address any questions or concerns that you may have at that time regarding the information given to you following your procedure. This is a courtesy call and so if there is no answer at the home number and we have not heard from you through the emergency physician on call, we will assume that you have returned to your regular daily activities without incident.  SIGNATURES/CONFIDENTIALITY: You and/or your care partner have signed paperwork which will be entered into your electronic medical record.  These signatures attest to the fact that that the information above on your After Visit Summary has been reviewed and is understood.  Full responsibility of the confidentiality   of this discharge information lies with you and/or your care-partner.     

## 2011-11-29 NOTE — Progress Notes (Signed)
Patient did not experience any of the following events: a burn prior to discharge; a fall within the facility; wrong site/side/patient/procedure/implant event; or a hospital transfer or hospital admission upon discharge from the facility. (G8907) Patient did not have preoperative order for IV antibiotic SSI prophylaxis. (G8918)  

## 2011-11-29 NOTE — Op Note (Signed)
[facility] [address] [place], [address]   COLONOSCOPY PROCEDURE REPORT  PATIENT: [name], [name]  MR#: [id number] BIRTHDATE: June 11, 1945 , 66  yrs. old GENDER: Female ENDOSCOPIST: Meryl Dare, MD, [facility] PROCEDURE DATE:  11/29/2011 PROCEDURE:   Colonoscopy with snare polypectomy and Colonoscopy with biopsy ASA CLASS:   Class II INDICATIONS:patient's personal history of adenomatous colon polyps, 09/2004. MEDICATIONS: MAC sedation, administered by CRNA and propofol (Diprivan) 230mg  IV DESCRIPTION OF PROCEDURE:   After the risks benefits and alternatives of the procedure were thoroughly explained, informed consent was obtained.  A digital rectal exam revealed no abnormalities of the rectum.   The LB [id number]  endoscope was introduced through the anus and advanced to the cecum, which was identified by both the appendix and ileocecal valve. No adverse events experienced.   The quality of the prep was excellent, using MoviPrep  The instrument was then slowly withdrawn as the colon was fully examined.  COLON FINDINGS: A sessile polyp measuring 3 mm in size was found in the transverse colon.  A polypectomy was performed with cold forceps.  The resection was complete and the polyp tissue was completely retrieved.   A sessile polyp measuring 6 mm in size was found in the transverse colon.  A polypectomy was performed with a cold snare.  The resection was complete and the polyp tissue was completely retrieved.   A sessile polyp measuring 3 mm in size was found.  A polypectomy was performed with cold forceps.  The resection was complete and the polyp tissue was completely retrieved.   A sessile polyp measuring 5 mm in size was found in the sigmoid colon.  A polypectomy was performed with a cold snare. The colon was otherwise normal.  There was no diverticulosis, inflammation, polyps or cancers unless previously stated. Retroflexed views revealed small  internal hemorrhoids. The time to cecum=1 minutes 35 seconds.  Withdrawal time=8 minutes 42 seconds. The scope was withdrawn and the procedure completed. COMPLICATIONS: There were no complications. ENDOSCOPIC IMPRESSION: 1.   Sessile polyp in the transverse colon; polypectomy was performed with cold forceps 2.   Sessile polyp in the transverse colon; polypectomy was performed with a cold snare 3.   Sessile polyp in the sigmiod colon; polypectomy was performed with cold forceps 4.   Sessile polyp in the sigmoid colon; polypectomy was performed with a cold snare 5.   Small internal hemorrhoids  RECOMMENDATIONS: 1.  await pathology results 2.  repeat Colonoscopy in 5 years eSigned:  Meryl Dare, MD, [facility] 11/29/2011 1:46 PM   cc: Knox Royalty, MD

## 2011-11-30 ENCOUNTER — Telehealth: Payer: Self-pay | Admitting: *Deleted

## 2011-11-30 NOTE — Telephone Encounter (Signed)
  Follow up Call-  Call back number 11/29/2011  Post procedure Call Back phone  # [phone]  Permission to leave phone message Yes     Patient questions:  Do you have a fever, pain , or abdominal swelling? no Pain Score  0 *  Have you tolerated food without any problems? yes  Have you been able to return to your normal activities? yes  Do you have any questions about your discharge instructions: Diet   no Medications  no Follow up visit  no  Do you have questions or concerns about your Care? no  Actions: * If pain score is 4 or above: No action needed, pain <4.

## 2011-12-04 ENCOUNTER — Encounter: Payer: Self-pay | Admitting: Gastroenterology

## 2012-03-31 ENCOUNTER — Inpatient Hospital Stay (HOSPITAL_COMMUNITY)
Admission: EM | Admit: 2012-03-31 | Discharge: 2012-04-05 | DRG: 203 | Disposition: A | Discharge status: Home / Self Care | Payer: Medicare Other | Attending: Internal Medicine | Admitting: Internal Medicine

## 2012-03-31 ENCOUNTER — Emergency Department (HOSPITAL_COMMUNITY): Payer: Medicare Other

## 2012-03-31 ENCOUNTER — Encounter (HOSPITAL_COMMUNITY): Payer: Self-pay | Admitting: Emergency Medicine

## 2012-03-31 DIAGNOSIS — Z79899 Other long term (current) drug therapy: Secondary | ICD-10-CM

## 2012-03-31 DIAGNOSIS — J45909 Unspecified asthma, uncomplicated: Secondary | ICD-10-CM

## 2012-03-31 DIAGNOSIS — J45901 Unspecified asthma with (acute) exacerbation: Principal | ICD-10-CM | POA: Diagnosis present

## 2012-03-31 DIAGNOSIS — E876 Hypokalemia: Secondary | ICD-10-CM | POA: Diagnosis present

## 2012-03-31 DIAGNOSIS — K219 Gastro-esophageal reflux disease without esophagitis: Secondary | ICD-10-CM | POA: Diagnosis present

## 2012-03-31 DIAGNOSIS — I10 Essential (primary) hypertension: Secondary | ICD-10-CM | POA: Diagnosis present

## 2012-03-31 DIAGNOSIS — R011 Cardiac murmur, unspecified: Secondary | ICD-10-CM | POA: Diagnosis present

## 2012-03-31 LAB — BASIC METABOLIC PANEL
BUN: 12 mg/dL (ref 6–23)
CO2: 24 mEq/L (ref 19–32)
Calcium: 8.5 mg/dL (ref 8.4–10.5)
Chloride: 99 mEq/L (ref 96–112)
Creatinine, Ser: 0.76 mg/dL (ref 0.50–1.10)

## 2012-03-31 LAB — CBC WITH DIFFERENTIAL/PLATELET
Basophils Absolute: 0 10*3/uL (ref 0.0–0.1)
Eosinophils Absolute: 0 10*3/uL (ref 0.0–0.7)
Eosinophils Relative: 0 % (ref 0–5)
HCT: 35.1 % — ABNORMAL LOW (ref 36.0–46.0)
Hemoglobin: 11.2 g/dL — ABNORMAL LOW (ref 12.0–15.0)
Hemoglobin: 11.2 g/dL — ABNORMAL LOW (ref 12.0–15.0)
Lymphocytes Relative: 6 % — ABNORMAL LOW (ref 12–46)
Lymphocytes Relative: 10 % — ABNORMAL LOW (ref 12–46)
Lymphs Abs: 0.6 10*3/uL — ABNORMAL LOW (ref 0.7–4.0)
MCH: 25 pg — ABNORMAL LOW (ref 26.0–34.0)
MCHC: 31.9 g/dL (ref 30.0–36.0)
MCV: 77.1 fL — ABNORMAL LOW (ref 78.0–100.0)
Monocytes Absolute: 0.2 10*3/uL (ref 0.1–1.0)
Monocytes Relative: 3 % (ref 3–12)
Monocytes Relative: 1 % — ABNORMAL LOW (ref 3–12)
Neutro Abs: 5.5 10*3/uL (ref 1.7–7.7)
Neutrophils Relative %: 89 % — ABNORMAL HIGH (ref 43–77)
Platelets: 195 10*3/uL (ref 150–400)
RBC: 4.48 MIL/uL (ref 3.87–5.11)
RDW: 15.9 % — ABNORMAL HIGH (ref 11.5–15.5)
WBC: 7.7 10*3/uL (ref 4.0–10.5)
WBC: 6.2 10*3/uL (ref 4.0–10.5)

## 2012-03-31 LAB — COMPREHENSIVE METABOLIC PANEL
Albumin: 3.1 g/dL — ABNORMAL LOW (ref 3.5–5.2)
Alkaline Phosphatase: 133 U/L — ABNORMAL HIGH (ref 39–117)
BUN: 14 mg/dL (ref 6–23)
Calcium: 8.6 mg/dL (ref 8.4–10.5)
GFR calc Af Amer: >90 mL/min (ref >90)
Potassium: 3.9 mEq/L (ref 3.5–5.1)
Sodium: 136 mEq/L (ref 135–145)
Total Protein: 7.1 g/dL (ref 6.0–8.3)

## 2012-03-31 LAB — MAGNESIUM: Magnesium: 2 mg/dL (ref 1.5–2.5)

## 2012-03-31 LAB — PROTIME-INR
INR: 0.99 (ref 0.00–1.49)
Prothrombin Time: 13 seconds (ref 11.6–15.2)

## 2012-03-31 LAB — APTT: aPTT: 38 seconds — ABNORMAL HIGH (ref 24–37)

## 2012-03-31 MED ORDER — IPRATROPIUM BROMIDE 0.02 % IN SOLN: 0.5000 mg | Freq: Four times a day (QID) | RESPIRATORY_TRACT | Status: DC

## 2012-03-31 MED ORDER — PREDNISONE 20 MG PO TABS
60.0000 mg | ORAL_TABLET | Freq: Once | ORAL | Status: AC
  Administered 2012-03-31: 60 mg via ORAL
  Filled 2012-03-31: qty 3

## 2012-03-31 MED ORDER — FAMOTIDINE 20 MG PO TABS
20.0000 mg | ORAL_TABLET | Freq: Every day | ORAL | Status: DC
  Administered 2012-03-31 – 2012-04-04 (×5): 20 mg via ORAL
  Filled 2012-03-31 (×6): qty 1

## 2012-03-31 MED ORDER — SODIUM CHLORIDE 0.9 % IV SOLN
INTRAVENOUS | Status: DC
  Administered 2012-03-31: 20 mL/h via INTRAVENOUS
  Administered 2012-04-01 – 2012-04-03 (×3): via INTRAVENOUS
  Administered 2012-04-05: 250 mL via INTRAVENOUS

## 2012-03-31 MED ORDER — ALBUTEROL (5 MG/ML) CONTINUOUS INHALATION SOLN
10.0000 mg/h | INHALATION_SOLUTION | Freq: Once | RESPIRATORY_TRACT | Status: AC
  Administered 2012-03-31: 10 mg/h via RESPIRATORY_TRACT

## 2012-03-31 MED ORDER — GUAIFENESIN ER 600 MG PO TB12
600.0000 mg | ORAL_TABLET | Freq: Two times a day (BID) | ORAL | Status: DC
  Administered 2012-03-31 – 2012-04-05 (×10): 600 mg via ORAL
  Filled 2012-03-31 (×11): qty 1

## 2012-03-31 MED ORDER — ALBUTEROL SULFATE (5 MG/ML) 0.5% IN NEBU
2.5000 mg | INHALATION_SOLUTION | RESPIRATORY_TRACT | Status: DC | PRN
  Administered 2012-04-01: 2.5 mg via RESPIRATORY_TRACT
  Filled 2012-03-31: qty 0.5

## 2012-03-31 MED ORDER — ONDANSETRON HCL 4 MG PO TABS: 4.0000 mg | ORAL_TABLET | Freq: Four times a day (QID) | ORAL | Status: DC | PRN

## 2012-03-31 MED ORDER — ALBUTEROL SULFATE (5 MG/ML) 0.5% IN NEBU
2.5000 mg | INHALATION_SOLUTION | Freq: Four times a day (QID) | RESPIRATORY_TRACT | Status: DC
  Administered 2012-03-31 – 2012-04-05 (×18): 2.5 mg via RESPIRATORY_TRACT
  Filled 2012-03-31 (×18): qty 0.5

## 2012-03-31 MED ORDER — ALBUTEROL SULFATE (5 MG/ML) 0.5% IN NEBU
5.0000 mg | INHALATION_SOLUTION | Freq: Once | RESPIRATORY_TRACT | Status: AC
  Administered 2012-03-31: 5 mg via RESPIRATORY_TRACT
  Filled 2012-03-31 (×2): qty 1

## 2012-03-31 MED ORDER — ENOXAPARIN SODIUM 40 MG/0.4ML ~~LOC~~ SOLN
40.0000 mg | Freq: Every day | SUBCUTANEOUS | Status: DC
  Administered 2012-03-31 – 2012-04-04 (×5): 40 mg via SUBCUTANEOUS
  Filled 2012-03-31 (×6): qty 0.4

## 2012-03-31 MED ORDER — FLUTICASONE PROPIONATE 50 MCG/ACT NA SUSP
2.0000 | Freq: Every day | NASAL | Status: DC
  Administered 2012-03-31 – 2012-04-05 (×6): 2 via NASAL
  Filled 2012-03-31: qty 16

## 2012-03-31 MED ORDER — IPRATROPIUM BROMIDE 0.02 % IN SOLN
0.5000 mg | Freq: Four times a day (QID) | RESPIRATORY_TRACT | Status: DC
  Administered 2012-03-31 – 2012-04-05 (×18): 0.5 mg via RESPIRATORY_TRACT
  Filled 2012-03-31 (×18): qty 2.5

## 2012-03-31 MED ORDER — SODIUM CHLORIDE 0.9 % IV SOLN
INTRAVENOUS | Status: AC
  Administered 2012-03-31: 19:00:00 via INTRAVENOUS

## 2012-03-31 MED ORDER — POTASSIUM CHLORIDE CRYS ER 20 MEQ PO TBCR
40.0000 meq | EXTENDED_RELEASE_TABLET | Freq: Once | ORAL | Status: AC
  Administered 2012-03-31: 40 meq via ORAL
  Filled 2012-03-31: qty 2

## 2012-03-31 MED ORDER — PANTOPRAZOLE SODIUM 40 MG PO TBEC
40.0000 mg | DELAYED_RELEASE_TABLET | Freq: Every day | ORAL | Status: DC
  Administered 2012-04-01 – 2012-04-05 (×5): 40 mg via ORAL
  Filled 2012-03-31 (×5): qty 1

## 2012-03-31 MED ORDER — ACETAMINOPHEN 325 MG PO TABS: 650.0000 mg | ORAL_TABLET | Freq: Four times a day (QID) | ORAL | Status: DC | PRN

## 2012-03-31 MED ORDER — ALBUTEROL SULFATE (5 MG/ML) 0.5% IN NEBU
INHALATION_SOLUTION | RESPIRATORY_TRACT | Status: AC
  Filled 2012-03-31: qty 2

## 2012-03-31 MED ORDER — ONDANSETRON HCL 4 MG/2ML IJ SOLN: 4.0000 mg | Freq: Four times a day (QID) | INTRAMUSCULAR | Status: DC | PRN

## 2012-03-31 MED ORDER — METHYLPREDNISOLONE SODIUM SUCC 125 MG IJ SOLR
60.0000 mg | Freq: Two times a day (BID) | INTRAMUSCULAR | Status: DC
  Administered 2012-03-31 – 2012-04-02 (×4): 60 mg via INTRAVENOUS
  Filled 2012-03-31 (×5): qty 0.96

## 2012-03-31 MED ORDER — ACETAMINOPHEN 650 MG RE SUPP: 650.0000 mg | Freq: Four times a day (QID) | RECTAL | Status: DC | PRN

## 2012-03-31 NOTE — H&P (Signed)
[facility] History and Physical  [name] ZOX:[id number] DOB: Apr 04, 1945 DOA: 03/31/2012  Referring physician: ER physician PCP: Paulino Rily, MD   Chief Complaint: shortness of breath  HPI:  67 year old female with past medical history of asthma who presented with worsening shortness of breath for past few days prior to this admission associated with occasional productive cough. Patient reports no subjective or measured fevers, no chills, no chest pain, no palpitations. No reports of abdominal pain, no nausea or vomiting. In ED, CXR showed no acute cardiopulmonary findings, maybe questionable bronchitis. BMET revealed mild hypokalemia of 3.3 which was repleted in ED.  Assessment and Plan:  Principal Problem:  * Acute asthma exacerbation  Start nebulizer treatment Q 2 hours PRN and Q 6 hours scheduled  Start solumedrol 60 mg Q 12 hours IV  Oxygen support via nasal canula to keeep O2 saturation above 90% Active Problems:   Hypokalemia  repleted in ED  Follow up BMP in am  Manson Passey [facility] [phone]  Review of Systems:  Constitutional: Negative for fever, chills and malaise/fatigue. Negative for diaphoresis.  HENT: Negative for hearing loss, ear pain, nosebleeds, congestion, sore throat, neck pain, tinnitus and ear discharge.   Eyes: Negative for blurred vision, double vision, photophobia, pain, discharge and redness.  Respiratory: per HPI   Cardiovascular: Negative for chest pain, palpitations, orthopnea, claudication and leg swelling.  Gastrointestinal: Negative for nausea, vomiting and abdominal pain. Negative for heartburn, constipation, blood in stool and melena.  Genitourinary: Negative for dysuria, urgency, frequency, hematuria and flank pain.  Musculoskeletal: Negative for myalgias, back pain, joint pain and falls.  Skin: Negative for itching and rash.  Neurological: Negative for dizziness and weakness. Negative for tingling, tremors, sensory change,  speech change, focal weakness, loss of consciousness and headaches.  Endo/Heme/Allergies: Negative for environmental allergies and polydipsia. Does not bruise/bleed easily.  Psychiatric/Behavioral: Negative for suicidal ideas. The patient is not nervous/anxious.      Past Medical History  Diagnosis Date  . Asthma   . GERD (gastroesophageal reflux disease)   . Heart murmur    Past Surgical History  Procedure Laterality Date  . Tonsillectomy    . Tubal ligation    . Cataract extraction w/ intraocular lens  implant, bilateral  2011 &2009   Social History:  reports that she has never smoked. She has never used smokeless tobacco. She reports that she does not drink alcohol or use illicit drugs.  Allergies  Allergen Reactions  . Aspirin Shortness Of Breath    Family History  Problem Relation Age of Onset  . Asthma Sister   . Emphysema Father     was a smoker  . Hypertension Mother   . Hypertension Sister      Prior to Admission medications   Medication Sig Start Date End Date Taking? Authorizing Provider  albuterol (PROVENTIL) (2.5 MG/3ML) 0.083% nebulizer solution Take 2.5 mg by nebulization every 6 (six) hours as needed. asthma   Yes Historical Provider, MD  budesonide-formoterol (SYMBICORT) 160-4.5 MCG/ACT inhaler Inhale 2 puffs into the lungs 2 (two) times daily. 11/28/11 11/27/12 Yes Nyoka Cowden, MD  famotidine (PEPCID) 20 MG tablet Take 20 mg by mouth at bedtime.     Yes Historical Provider, MD  fluticasone (FLONASE) 50 MCG/ACT nasal spray Place 2 sprays into the nose daily. 11/28/11  Yes Nyoka Cowden, MD  pantoprazole (PROTONIX) 40 MG tablet Take 1 tablet (40 mg total) by mouth daily before breakfast. 11/28/11  Yes Nyoka Cowden,  MD   Physical Exam: Filed Vitals:   03/31/12 1215 03/31/12 1229 03/31/12 1339 03/31/12 1340  BP:    146/75  Pulse: 100   121  Temp:    99.5 F (37.5 C)  TempSrc:    Oral  Resp:    18  SpO2: 100% 94% 89% 94%    Physical Exam   Constitutional: Appears well-developed and well-nourished. No distress.  HENT: Normocephalic. External right and left ear normal. Oropharynx is clear and moist.  Eyes: Conjunctivae and EOM are normal. PERRLA, no scleral icterus.  Neck: Normal ROM. Neck supple. No JVD. No tracheal deviation. No thyromegaly.  CVS: RRR, S1/S2 +, no murmurs, no gallops, no carotid bruit.  Pulmonary: wheezing in upper lobes, no rhonchi.  Abdominal: Soft. BS +,  no distension, tenderness, rebound or guarding.  Musculoskeletal: Normal range of motion. No edema and no tenderness.  Lymphadenopathy: No lymphadenopathy noted, cervical, inguinal. Neuro: Alert. Normal reflexes, muscle tone coordination. No cranial nerve deficit. Skin: Skin is warm and dry. No rash noted. Not diaphoretic. No erythema. No pallor.  Psychiatric: Normal mood and affect. Behavior, judgment, thought content normal.   Labs on Admission:  Basic Metabolic Panel:  Recent Labs Lab 03/31/12 1512  NA 136  K 3.3*  CL 99  CO2 24  GLUCOSE 117*  BUN 12  CREATININE 0.76  CALCIUM 8.5   Liver Function Tests: No results found for this basename: AST, ALT, ALKPHOS, BILITOT, PROT, ALBUMIN,  in the last 168 hours No results found for this basename: LIPASE, AMYLASE,  in the last 168 hours No results found for this basename: AMMONIA,  in the last 168 hours CBC:  Recent Labs Lab 03/31/12 1512  WBC 7.7  NEUTROABS 7.0  HGB 11.2*  HCT 35.1*  MCV 77.1*  PLT 202   Cardiac Enzymes: No results found for this basename: CKTOTAL, CKMB, CKMBINDEX, TROPONINI,  in the last 168 hours BNP: No components found with this basename: POCBNP,  CBG: No results found for this basename: GLUCAP,  in the last 168 hours  Radiological Exams on Admission: Dg Chest 2 View (if Patient Has Fever And/or Copd)  03/31/2012  *RADIOLOGY REPORT*  Clinical Data: Shortness of breath, wheezing, cough  CHEST - 2 VIEW  Comparison: Chest x-ray of 06/18/2009  Findings: No pneumonia  is seen and no effusion is noted.  There is some prominence of perihilar markings, and bronchitis is a consideration. Mediastinal contours are stable.  The heart is within normal limits in size.  No skeletal abnormality is seen.  IMPRESSION: No pneumonia.  Question bronchitis.   Original Report Authenticated By: Dwyane Dee, M.D.     EKG: Normal sinus rhythm, no ST/T wave changes  Code Status: Full Family Communication: Pt at bedside Disposition Plan: Admit for further evaluation  Manson Passey, MD  [facility] Pager [phone]  If 7PM-7AM, please contact night-coverage [email or web address] Password [facility] 03/31/2012, 4:04 PM

## 2012-03-31 NOTE — ED Provider Notes (Signed)
History     CSN: [id number]  Arrival date & time 03/31/12  1117   First MD Initiated Contact with Patient 03/31/12 1204      Chief Complaint  Patient presents with  . Asthma  . Shortness of Breath    (Consider location/radiation/quality/duration/timing/severity/associated sxs/prior treatment) Patient is a 67 y.o. female presenting with asthma and shortness of breath. The history is provided by the patient.  Asthma Associated symptoms include shortness of breath.  Shortness of Breath  patient here with cough and congestion and worsening asthma symptoms x2 days. Has used her home nebulizer without relief. Cough has been nonproductive in similar to her prior asthma exacerbations. Does note some posttussive emesis. No anginal type chest pain. No CHF type symptoms. He is unsure what triggered her asthma. No recent steroid use  Past Medical History  Diagnosis Date  . Asthma   . GERD (gastroesophageal reflux disease)   . Heart murmur     Past Surgical History  Procedure Laterality Date  . Tonsillectomy    . Tubal ligation    . Cataract extraction w/ intraocular lens  implant, bilateral  2011 &2009    Family History  Problem Relation Age of Onset  . Asthma Sister   . Emphysema Father     was a smoker  . Hypertension Mother   . Hypertension Sister     History  Substance Use Topics  . Smoking status: Never Smoker   . Smokeless tobacco: Never Used  . Alcohol Use: No    OB History   Grav Para Term Preterm Abortions TAB SAB Ect Mult Living                  Review of Systems  Respiratory: Positive for shortness of breath.   All other systems reviewed and are negative.    Allergies  Aspirin  Home Medications   Current Outpatient Rx  Name  Route  Sig  Dispense  Refill  . albuterol (PROVENTIL) (2.5 MG/3ML) 0.083% nebulizer solution   Nebulization   Take 2.5 mg by nebulization every 6 (six) hours as needed. asthma         . albuterol (PROVENTIL,VENTOLIN) 90  MCG/ACT inhaler   Inhalation   Inhale 2 puffs into the lungs every 6 (six) hours as needed.         . budesonide-formoterol (SYMBICORT) 160-4.5 MCG/ACT inhaler   Inhalation   Inhale 2 puffs into the lungs 2 (two) times daily.   3 Inhaler   3   . famotidine (PEPCID) 20 MG tablet   Oral   Take 20 mg by mouth at bedtime.           . fluticasone (FLONASE) 50 MCG/ACT nasal spray   Nasal   Place 2 sprays into the nose daily.   48 g   3   . pantoprazole (PROTONIX) 40 MG tablet   Oral   Take 1 tablet (40 mg total) by mouth daily before breakfast.   90 tablet   3   . traMADol (ULTRAM) 50 MG tablet   Oral   Take 1 tablet (50 mg total) by mouth 4 (four) times daily as needed.   30 tablet   0     BP 155/75  Pulse 100  Temp(Src) 98.4 F (36.9 C) (Oral)  Resp 18  SpO2 100%  Physical Exam  Nursing note and vitals reviewed. Constitutional: She is oriented to person, place, and time. She appears well-developed and well-nourished.  Non-toxic appearance. No  distress.  HENT:  Head: Normocephalic and atraumatic.  Eyes: Conjunctivae, EOM and lids are normal. Pupils are equal, round, and reactive to light.  Neck: Normal range of motion. Neck supple. No tracheal deviation present. No mass present.  Cardiovascular: Normal rate, regular rhythm and normal heart sounds.  Exam reveals no gallop.   No murmur heard. Pulmonary/Chest: Effort normal. No stridor. No respiratory distress. She has decreased breath sounds. She has wheezes. She has no rhonchi. She has no rales.  Abdominal: Soft. Normal appearance and bowel sounds are normal. She exhibits no distension. There is no tenderness. There is no rebound and no CVA tenderness.  Musculoskeletal: Normal range of motion. She exhibits no edema and no tenderness.  Neurological: She is alert and oriented to person, place, and time. She has normal strength. No cranial nerve deficit or sensory deficit. GCS eye subscore is 4. GCS verbal subscore is  5. GCS motor subscore is 6.  Skin: Skin is warm and dry. No abrasion and no rash noted.  Psychiatric: She has a normal mood and affect. Her speech is normal and behavior is normal.    ED Course  Procedures (including critical care time)  Labs Reviewed - No data to display Dg Chest 2 View (if Patient Has Fever And/or Copd)  03/31/2012  *RADIOLOGY REPORT*  Clinical Data: Shortness of breath, wheezing, cough  CHEST - 2 VIEW  Comparison: Chest x-ray of 06/18/2009  Findings: No pneumonia is seen and no effusion is noted.  There is some prominence of perihilar markings, and bronchitis is a consideration. Mediastinal contours are stable.  The heart is within normal limits in size.  No skeletal abnormality is seen.  IMPRESSION: No pneumonia.  Question bronchitis.   Original Report Authenticated By: Dwyane Dee, M.D.      No diagnosis found.    MDM  Pt given prednisone and albuterol tx --wheezes remain , pt requires admission  CRITICAL CARE Performed by: Toy Baker   Total critical care time: 65  Critical care time was exclusive of separately billable procedures and treating other patients.  Critical care was necessary to treat or prevent imminent or life-threatening deterioration.  Critical care was time spent personally by me on the following activities: development of treatment plan with patient and/or surrogate as well as nursing, discussions with consultants, evaluation of patient's response to treatment, examination of patient, obtaining history from patient or surrogate, ordering and performing treatments and interventions, ordering and review of laboratory studies, ordering and review of radiographic studies, pulse oximetry and re-evaluation of patient's condition.         Toy Baker, MD 03/31/12 [phone]

## 2012-03-31 NOTE — ED Notes (Signed)
Pt reports that she has been sick with cough, fever, N/V, body aches since Friday. Pt in NAD and A&O

## 2012-03-31 NOTE — ED Notes (Signed)
Respiratory called to admin neb

## 2012-03-31 NOTE — ED Notes (Signed)
Pt states she has had chest congestion, cough, and sob since Friday.  Audible wheezing and rhonchi noted.  Pt does not appear to be in distress. 95% on ra

## 2012-03-31 NOTE — ED Notes (Signed)
Paged Dr. Elisabeth Pigeon in reference to room placement. Dr. Elisabeth Pigeon stated that pt does not need tele and to change room to 3W. Bed placment notified

## 2012-04-01 ENCOUNTER — Encounter (HOSPITAL_COMMUNITY): Payer: Self-pay

## 2012-04-01 DIAGNOSIS — J45909 Unspecified asthma, uncomplicated: Secondary | ICD-10-CM

## 2012-04-01 LAB — CBC
HCT: 35.1 % — ABNORMAL LOW (ref 36.0–46.0)
MCH: 24.5 pg — ABNORMAL LOW (ref 26.0–34.0)
MCV: 77.5 fL — ABNORMAL LOW (ref 78.0–100.0)
Platelets: 201 10*3/uL (ref 150–400)
RDW: 16.1 % — ABNORMAL HIGH (ref 11.5–15.5)
WBC: 4.4 10*3/uL (ref 4.0–10.5)

## 2012-04-01 LAB — COMPREHENSIVE METABOLIC PANEL
AST: 16 U/L (ref 0–37)
Albumin: 3.1 g/dL — ABNORMAL LOW (ref 3.5–5.2)
Alkaline Phosphatase: 124 U/L — ABNORMAL HIGH (ref 39–117)
BUN: 15 mg/dL (ref 6–23)
Chloride: 106 mEq/L (ref 96–112)
Potassium: 4.3 mEq/L (ref 3.5–5.1)
Sodium: 139 mEq/L (ref 135–145)
Total Bilirubin: 0.2 mg/dL — ABNORMAL LOW (ref 0.3–1.2)
Total Protein: 6.7 g/dL (ref 6.0–8.3)

## 2012-04-01 LAB — GLUCOSE, CAPILLARY

## 2012-04-01 NOTE — Evaluation (Signed)
Physical Therapy Evaluation Patient Details Name: [name] MRN: [id number] DOB: 08-Oct-1945 Today's Date: 04/01/2012 Time: 1191-4782 PT Time Calculation (min): 13 min  PT Assessment / Plan / Recommendation Clinical Impression  67 yo female admitted with acute asthma exacerbation. On eval, pt is Modified independent for mobility. O2 sats 97% on RA at rest and 93% on RA during ambulation. Limiting factor is respiratory in nature. Pt does not have any acute PT needs at this time. 1x eval. Encouraged pt to ambulate in hallway, as tolerated, throughout the day during hospital stay. PT will sign off.     PT Assessment  Patent does not need any further PT services    Follow Up Recommendations  No PT follow up    Does the patient have the potential to tolerate intense rehabilitation      Barriers to Discharge        Equipment Recommendations  None recommended by PT    Recommendations for Other Services     Frequency      Precautions / Restrictions Precautions Precautions: None Restrictions Weight Bearing Restrictions: No   Pertinent Vitals/Pain O2 sats: 97% RA at rest, 93% on RA during ambulation      Mobility  Bed Mobility Bed Mobility: Supine to Sit Supine to Sit: 7: Independent Transfers Transfers: Sit to Stand;Stand to Sit Sit to Stand: 7: Independent Stand to Sit: 7: Independent Ambulation/Gait Ambulation/Gait Assistance: 6: Modified independent (Device/Increase time) Ambulation Distance (Feet): 140 Feet Assistive device: None Ambulation/Gait Assistance Details: 1 standing rest break needed. Audible wheezing noted after ~70 feet. Good gait speed. O2 sats 93% on RA. VCs for pursed lip breathing and rest breaks when/if needed.  Gait Pattern: Within Functional Limits    Exercises     PT Diagnosis:    PT Problem List:   PT Treatment Interventions:     PT Goals    Visit Information  Last PT Received On: 04/01/12 Assistance Needed: +1    Subjective Data  Subjective: "I get around fine...its my breathing" Patient Stated Goal: Breathe better   Prior Functioning  Home Living Lives With: Family Prior Function Level of Independence: Independent Communication Communication: No difficulties    Cognition  Cognition Overall Cognitive Status: Appears within functional limits for tasks assessed/performed Arousal/Alertness: Awake/alert Orientation Level: Oriented X4 / Intact Behavior During Session: WFL for tasks performed    Extremity/Trunk Assessment Right Lower Extremity Assessment RLE ROM/Strength/Tone: Within functional levels Left Lower Extremity Assessment LLE ROM/Strength/Tone: Within functional levels Trunk Assessment Trunk Assessment: Normal   Balance Balance Balance Assessed: Yes Dynamic Standing Balance Dynamic Standing - Balance Support: No upper extremity supported Dynamic Standing - Level of Assistance: 7: Independent  End of Session PT - End of Session Activity Tolerance: Other (comment) (Breathing is limiting factor) Patient left: in bed;with call bell/phone within reach  GP     Rebeca Alert [facility] 04/01/2012, 10:07 AM [phone]

## 2012-04-01 NOTE — Progress Notes (Signed)
Utilization review completed.  

## 2012-04-01 NOTE — Progress Notes (Signed)
[facility] PROGRESS NOTE  [name] ZOX:[id number] DOB: 06/11/1945 DOA: 03/31/2012 PCP: Paulino Rily, MD  Assessment/Plan: Acute asthma exacerbation  Continue current nebulizer treatment Q 2 hours PRN and Q 6 hours scheduled  Continue solumedrol 60 mg Q 12 hours IV will plan on decreasing solumedrol likely next am.   Oxygen support via nasal canula to keeep O2 saturation above 90% CXR negative for pneumonia WBC within normal limits and patient afebrile therefore will not start an antibiotic regimen Suspect asthma was exacerbated by viral URI as patient reports that prior to having discomfort she had upper respiratory symptoms. Active Problems:  Hypokalemia  repleted in ED  Resolved after repletion with K at 4.3 today.  Code Status: full Family Communication: no family at bedside  Disposition Plan: Pending continued improvement in condition.   Consultants:  none  Procedures:  none  Antibiotics:  none   HPI/Subjective: Patient currently feels better. No acute issues overnight.  Still feeling SOB and supplemental oxygen helping.  Objective: Filed Vitals:   04/01/12 0555 04/01/12 0944 04/01/12 1356 04/01/12 1438  BP: 144/89  156/87   Pulse: 85  94   Temp: 98.5 F (36.9 C)  98.6 F (37 C)   TempSrc: Oral  Oral   Resp: 20  18   Height:      Weight: 84 kg (185 lb 3 oz)     SpO2: 95% 97% 95% 95%    Intake/Output Summary (Last 24 hours) at 04/01/12 1458 Last data filed at 04/01/12 1300  Gross per 24 hour  Intake      0 ml  Output    700 ml  Net   -700 ml   Filed Weights   03/31/12 1857 04/01/12 0555  Weight: 83 kg (182 lb 15.7 oz) 84 kg (185 lb 3 oz)    Exam:   General:  Pt in NAD, Alert and oriented  Cardiovascular: RRR, No MRg  Respiratory: expiratory wheezes diffusely mild, no rhales, breath sounds BL  Abdomen: soft, ND  Data Reviewed: Basic Metabolic Panel:  Recent Labs Lab 03/31/12 1512 03/31/12 2107 04/01/12 0500  NA 136 136  139  K 3.3* 3.9 4.3  CL 99 102 106  CO2 24 23 24   GLUCOSE 117* 216* 144*  BUN 12 14 15   CREATININE 0.76 0.74 0.72  CALCIUM 8.5 8.6 8.8  MG  --  2.0  --   PHOS  --  3.3  --    Liver Function Tests:  Recent Labs Lab 03/31/12 2107 04/01/12 0500  AST 19 16  ALT 12 11  ALKPHOS 133* 124*  BILITOT 0.3 0.2*  PROT 7.1 6.7  ALBUMIN 3.1* 3.1*   No results found for this basename: LIPASE, AMYLASE,  in the last 168 hours No results found for this basename: AMMONIA,  in the last 168 hours CBC:  Recent Labs Lab 03/31/12 1512 03/31/12 2107 04/01/12 0500  WBC 7.7 6.2 4.4  NEUTROABS 7.0 5.5  --   HGB 11.2* 11.2* 11.1*  HCT 35.1* 34.5* 35.1*  MCV 77.1* 77.0* 77.5*  PLT 202 195 201   Cardiac Enzymes: No results found for this basename: CKTOTAL, CKMB, CKMBINDEX, TROPONINI,  in the last 168 hours BNP (last 3 results) No results found for this basename: PROBNP,  in the last 8760 hours CBG:  Recent Labs Lab 04/01/12 0547  GLUCAP 126*    No results found for this or any previous visit (from the past 240 hour(s)).   Studies: Dg Chest 2 View (  if Patient Has Fever And/or Copd)  03/31/2012  *RADIOLOGY REPORT*  Clinical Data: Shortness of breath, wheezing, cough  CHEST - 2 VIEW  Comparison: Chest x-ray of 06/18/2009  Findings: No pneumonia is seen and no effusion is noted.  There is some prominence of perihilar markings, and bronchitis is a consideration. Mediastinal contours are stable.  The heart is within normal limits in size.  No skeletal abnormality is seen.  IMPRESSION: No pneumonia.  Question bronchitis.   Original Report Authenticated By: Dwyane Dee, M.D.     Scheduled Meds: . albuterol  2.5 mg Nebulization Q6H  . enoxaparin (LOVENOX) injection  40 mg Subcutaneous QHS  . famotidine  20 mg Oral QHS  . fluticasone  2 spray Each Nare Daily  . guaiFENesin  600 mg Oral BID  . ipratropium  0.5 mg Nebulization Q6H  . methylPREDNISolone (SOLU-MEDROL) injection  60 mg Intravenous  BID  . pantoprazole  40 mg Oral Daily   Continuous Infusions: . sodium chloride 10 mL/hr at 04/01/12 1131    Principal Problem:   Acute asthma exacerbation Active Problems:   Hypokalemia    Time spent: > 35 minutes    [name]  [facility] Pager [phone]. If 8PM-8AM, please contact night-coverage at [email or web address], password [facility] 04/01/2012, 2:58 PM  LOS: 1 day

## 2012-04-02 LAB — GLUCOSE, CAPILLARY: Glucose-Capillary: 99 mg/dL (ref 70–99)

## 2012-04-02 MED ORDER — METHYLPREDNISOLONE SODIUM SUCC 125 MG IJ SOLR
60.0000 mg | Freq: Every day | INTRAMUSCULAR | Status: DC
  Filled 2012-04-02: qty 0.96

## 2012-04-02 MED ORDER — BENZONATATE 100 MG PO CAPS
100.0000 mg | ORAL_CAPSULE | Freq: Three times a day (TID) | ORAL | Status: DC | PRN
  Administered 2012-04-02 – 2012-04-04 (×4): 100 mg via ORAL
  Filled 2012-04-02 (×4): qty 1

## 2012-04-02 NOTE — Progress Notes (Signed)
[facility] PROGRESS NOTE  [name] AVW:[id number] DOB: 05/17/1945 DOA: 03/31/2012 PCP: Paulino Rily, MD  Assessment/Plan: Acute asthma exacerbation  Continue current nebulizer treatment Q 2 hours PRN and Q 6 hours scheduled  Decrease solumedrol to 60 mg IV daily starting next am given improvement in condition. Oxygen support via nasal canula to keeep O2 saturation above 90% CXR negative for pneumonia WBC within normal limits and patient afebrile therefore will not start an antibiotic regimen Suspect asthma was exacerbated by viral URI as patient reports that prior to having discomfort she had upper respiratory symptoms. Active Problems:  Hypokalemia  repleted in ED  Resolved after repletion with K at 4.3 today.  Code Status: full Family Communication: no family at bedside  Disposition Plan: Pending continued improvement in condition.   Consultants:  none  Procedures:  none  Antibiotics:  none   HPI/Subjective: Patient currently feels better. No acute issues overnight.  Still feeling SOB and supplemental oxygen helping.  Objective: Filed Vitals:   04/02/12 0806 04/02/12 0900 04/02/12 1344 04/02/12 1418  BP:  132/76  135/81  Pulse:  96  93  Temp:  97.9 F (36.6 C)  98.4 F (36.9 C)  TempSrc:  Oral  Oral  Resp:    18  Height:      Weight:      SpO2: 95% 93% 97% 95%    Intake/Output Summary (Last 24 hours) at 04/02/12 1703 Last data filed at 04/02/12 0900  Gross per 24 hour  Intake    490 ml  Output    300 ml  Net    190 ml   Filed Weights   03/31/12 1857 04/01/12 0555 04/02/12 0654  Weight: 83 kg (182 lb 15.7 oz) 84 kg (185 lb 3 oz) 85.2 kg (187 lb 13.3 oz)    Exam:   General:  Pt in NAD, Alert and oriented  Cardiovascular: RRR, No MRg  Respiratory: expiratory wheezes diffusely mild improved from yesterday, no rhales, breath sounds BL  Abdomen: soft, ND  Data Reviewed: Basic Metabolic Panel:  Recent Labs Lab 03/31/12 1512  03/31/12 2107 04/01/12 0500  NA 136 136 139  K 3.3* 3.9 4.3  CL 99 102 106  CO2 24 23 24   GLUCOSE 117* 216* 144*  BUN 12 14 15   CREATININE 0.76 0.74 0.72  CALCIUM 8.5 8.6 8.8  MG  --  2.0  --   PHOS  --  3.3  --    Liver Function Tests:  Recent Labs Lab 03/31/12 2107 04/01/12 0500  AST 19 16  ALT 12 11  ALKPHOS 133* 124*  BILITOT 0.3 0.2*  PROT 7.1 6.7  ALBUMIN 3.1* 3.1*   No results found for this basename: LIPASE, AMYLASE,  in the last 168 hours No results found for this basename: AMMONIA,  in the last 168 hours CBC:  Recent Labs Lab 03/31/12 1512 03/31/12 2107 04/01/12 0500  WBC 7.7 6.2 4.4  NEUTROABS 7.0 5.5  --   HGB 11.2* 11.2* 11.1*  HCT 35.1* 34.5* 35.1*  MCV 77.1* 77.0* 77.5*  PLT 202 195 201   Cardiac Enzymes: No results found for this basename: CKTOTAL, CKMB, CKMBINDEX, TROPONINI,  in the last 168 hours BNP (last 3 results) No results found for this basename: PROBNP,  in the last 8760 hours CBG:  Recent Labs Lab 04/01/12 0547 04/02/12 0728  GLUCAP 126* 99    No results found for this or any previous visit (from the past 240 hour(s)).   Studies:  No results found.  Scheduled Meds: . albuterol  2.5 mg Nebulization Q6H  . enoxaparin (LOVENOX) injection  40 mg Subcutaneous QHS  . famotidine  20 mg Oral QHS  . fluticasone  2 spray Each Nare Daily  . guaiFENesin  600 mg Oral BID  . ipratropium  0.5 mg Nebulization Q6H  . [START ON 04/03/2012] methylPREDNISolone (SOLU-MEDROL) injection  60 mg Intravenous Daily  . pantoprazole  40 mg Oral Daily   Continuous Infusions: . sodium chloride 10 mL/hr at 04/02/12 1130    Principal Problem:   Acute asthma exacerbation Active Problems:   Hypokalemia    Time spent: > 35 minutes    Penny Pia  [facility] Pager [phone]. If 8PM-8AM, please contact night-coverage at [email or web address], password [facility] 04/02/2012, 5:03 PM  LOS: 2 days

## 2012-04-02 NOTE — Progress Notes (Signed)
Patient coughing slightly productive.  Dr. Cena Benton paged patient requesting cough medication

## 2012-04-03 LAB — GLUCOSE, CAPILLARY: Glucose-Capillary: 90 mg/dL (ref 70–99)

## 2012-04-03 MED ORDER — LEVOFLOXACIN 750 MG PO TABS
750.0000 mg | ORAL_TABLET | Freq: Every day | ORAL | Status: DC
  Administered 2012-04-03 – 2012-04-05 (×3): 750 mg via ORAL
  Filled 2012-04-03 (×3): qty 1

## 2012-04-03 MED ORDER — METHYLPREDNISOLONE SODIUM SUCC 125 MG IJ SOLR
60.0000 mg | Freq: Three times a day (TID) | INTRAMUSCULAR | Status: DC
  Administered 2012-04-03 – 2012-04-05 (×7): 60 mg via INTRAVENOUS
  Filled 2012-04-03 (×10): qty 0.96

## 2012-04-03 MED ORDER — VITAMINS A & D EX OINT
TOPICAL_OINTMENT | CUTANEOUS | Status: AC
  Administered 2012-04-03: 09:00:00
  Filled 2012-04-03: qty 5

## 2012-04-03 NOTE — Progress Notes (Signed)
Patient maintained an oxygen level of 92% on RA while ambulating.- Hulda Marin RN

## 2012-04-03 NOTE — Progress Notes (Signed)
[facility] PROGRESS NOTE  Anastasiya Gowin ZOX:[id number] DOB: Sep 26, 1945 DOA: 03/31/2012  PCP: Paulino Rily, MD  Brief HPI: 67 year old female with past medical history of asthma who presented with worsening shortness of breath for past few days prior to this admission associated with occasional productive cough. Patient reports no subjective or measured fevers, no chills, no chest pain, no palpitations. No reports of abdominal pain, no nausea or vomiting. In ED, CXR showed no acute cardiopulmonary findings, maybe questionable bronchitis.   Past medical history:  Past Medical History  Diagnosis Date  . Asthma   . GERD (gastroesophageal reflux disease)   . Heart murmur    Consultants: None  Procedures:  None  Antibiotics: Levaquin 2/12-->  Subjective: Patient feels better but still wheezing quite a bit. Denies any chest pain. Admits to shortness of breath. Coughing up yellow sputum. Has been able to ambulate. No other complaints.  Objective: Vital Signs  Filed Vitals:   04/02/12 2052 04/02/12 2110 04/03/12 0207 04/03/12 0600  BP:  135/82  121/66  Pulse:  84  77  Temp:  98.3 F (36.8 C)  98.2 F (36.8 C)  TempSrc:  Oral  Oral  Resp:  18  18  Height:      Weight:    85.8 kg (189 lb 2.5 oz)  SpO2: 97% 98% 97% 97%    Intake/Output Summary (Last 24 hours) at 04/03/12 8119 Last data filed at 04/02/12 0900  Gross per 24 hour  Intake    250 ml  Output      0 ml  Net    250 ml   Filed Weights   04/01/12 0555 04/02/12 0654 04/03/12 0600  Weight: 84 kg (185 lb 3 oz) 85.2 kg (187 lb 13.3 oz) 85.8 kg (189 lb 2.5 oz)    Intake/Output from previous day: 02/11 0701 - 02/12 0700 In: 250 [I.V.:250] Out: 300 [Urine:300]  General appearance: alert, cooperative, appears stated age and no distress Head: Normocephalic, without obvious abnormality, atraumatic Resp: diffuse end exp wheezing bilaterally. No crackles.  Cardio: regular rate and rhythm, S1, S2 normal, no murmur,  click, rub or gallop GI: soft, non-tender; bowel sounds normal; no masses,  no organomegaly Extremities: extremities normal, atraumatic, no cyanosis or edema Pulses: 2+ and symmetric Skin: Skin color, texture, turgor normal. No rashes or lesions Lymph nodes: Cervical, supraclavicular, and axillary nodes normal. Neurologic: Alert and oriented X 3, normal strength and tone. Normal symmetric reflexes. Normal coordination and gait  Lab Results:  Basic Metabolic Panel:  Recent Labs Lab 03/31/12 1512 03/31/12 2107 04/01/12 0500  NA 136 136 139  K 3.3* 3.9 4.3  CL 99 102 106  CO2 24 23 24   GLUCOSE 117* 216* 144*  BUN 12 14 15   CREATININE 0.76 0.74 0.72  CALCIUM 8.5 8.6 8.8  MG  --  2.0  --   PHOS  --  3.3  --    Liver Function Tests:  Recent Labs Lab 03/31/12 2107 04/01/12 0500  AST 19 16  ALT 12 11  ALKPHOS 133* 124*  BILITOT 0.3 0.2*  PROT 7.1 6.7  ALBUMIN 3.1* 3.1*   No results found for this basename: LIPASE, AMYLASE,  in the last 168 hours No results found for this basename: AMMONIA,  in the last 168 hours CBC:  Recent Labs Lab 03/31/12 1512 03/31/12 2107 04/01/12 0500  WBC 7.7 6.2 4.4  NEUTROABS 7.0 5.5  --   HGB 11.2* 11.2* 11.1*  HCT 35.1* 34.5* 35.1*  MCV 77.1* 77.0* 77.5*  PLT 202 195 201   Cardiac Enzymes: No results found for this basename: CKTOTAL, CKMB, CKMBINDEX, TROPONINI,  in the last 168 hours BNP (last 3 results) No results found for this basename: PROBNP,  in the last 8760 hours CBG:  Recent Labs Lab 04/01/12 0547 04/02/12 0728 04/03/12 0727  GLUCAP 126* 99 90    No results found for this or any previous visit (from the past 240 hour(s)).    Studies/Results: No results found.  Medications:  Scheduled: . albuterol  2.5 mg Nebulization Q6H  . enoxaparin (LOVENOX) injection  40 mg Subcutaneous QHS  . famotidine  20 mg Oral QHS  . fluticasone  2 spray Each Nare Daily  . guaiFENesin  600 mg Oral BID  . ipratropium  0.5 mg  Nebulization Q6H  . levofloxacin  750 mg Oral Daily  . methylPREDNISolone (SOLU-MEDROL) injection  60 mg Intravenous Q8H  . pantoprazole  40 mg Oral Daily  . vitamin A & D       Continuous: . sodium chloride 10 mL/hr at 04/02/12 1130   PJK:[id number], acetaminophen, albuterol, benzonatate, ondansetron (ZOFRAN) IV, ondansetron  Assessment/Plan:  Principal Problem:   Acute asthma exacerbation Active Problems:   Hypokalemia    Acute asthma exacerbation  Slow to improve. Still wheezing significantly. Continue solumedrol. Add antibiotics. Continue nebs. Check RA sats. O2 as needed. Suspect asthma was exacerbated by viral URI as patient reports that prior to having discomfort she had upper respiratory symptoms.  Hypokalemia  Repeat Bmet in AM.   DVT Prophylaxis Enoxaparin  Code Status: Full Code Family Communication: no family at bedside  Disposition Plan: Anticipate discharge in 1-2 days.    LOS: 3 days   [facility]  [facility] Pager [phone] 04/03/2012, 8:38 AM  If 8PM-8AM, please contact night-coverage at [email or web address], password [facility]

## 2012-04-04 LAB — BASIC METABOLIC PANEL
CO2: 27 mEq/L (ref 19–32)
Calcium: 9.1 mg/dL (ref 8.4–10.5)
Creatinine, Ser: 0.77 mg/dL (ref 0.50–1.10)
GFR calc Af Amer: >90 mL/min (ref >90)
GFR calc non Af Amer: 84 mL/min — ABNORMAL LOW (ref >90)

## 2012-04-04 LAB — GLUCOSE, CAPILLARY: Glucose-Capillary: 122 mg/dL — ABNORMAL HIGH (ref 70–99)

## 2012-04-04 MED ORDER — BUDESONIDE-FORMOTEROL FUMARATE 160-4.5 MCG/ACT IN AERO
2.0000 | INHALATION_SPRAY | Freq: Two times a day (BID) | RESPIRATORY_TRACT | Status: DC
  Administered 2012-04-04 – 2012-04-05 (×2): 2 via RESPIRATORY_TRACT
  Filled 2012-04-04: qty 6

## 2012-04-04 NOTE — Progress Notes (Signed)
[facility] PROGRESS NOTE  [name] ZOX:[id number] DOB: [birth year] DOA: 03/31/2012  PCP: Paulino Rily, MD  Brief HPI: 67 year old female with past medical history of asthma who presented with worsening shortness of breath for past few days prior to this admission associated with occasional productive cough. Patient reports no subjective or measured fevers, no chills, no chest pain, no palpitations. No reports of abdominal pain, no nausea or vomiting. In ED, CXR showed no acute cardiopulmonary findings, maybe questionable bronchitis.   Past medical history:  Past Medical History  Diagnosis Date  . Asthma   . GERD (gastroesophageal reflux disease)   . Heart murmur    Consultants: None  Procedures:  None  Antibiotics: Levaquin 2/12-->  Subjective: Patient feels about the same. Says she gets short of breath when she has a coughing spell. Denies any chest pain. Has been able to ambulate. No other complaints.  Objective: Vital Signs  Filed Vitals:   04/03/12 2159 04/04/12 0155 04/04/12 0508 04/04/12 0827  BP: 145/79  143/81   Pulse: 88  84   Temp: 97.9 F (36.6 C)  98.1 F (36.7 C)   TempSrc: Oral  Oral   Resp: 16 18 16    Height:      Weight:   86 kg (189 lb 9.5 oz)   SpO2: 93%  96% 96%    Intake/Output Summary (Last 24 hours) at 04/04/12 0959 Last data filed at 04/04/12 0600  Gross per 24 hour  Intake    200 ml  Output      0 ml  Net    200 ml   Filed Weights   04/02/12 0654 04/03/12 0600 04/04/12 0508  Weight: 85.2 kg (187 lb 13.3 oz) 85.8 kg (189 lb 2.5 oz) 86 kg (189 lb 9.5 oz)    General appearance: alert, cooperative, appears stated age and no distress Resp: diffuse end exp wheezing bilaterally. No crackles. Not much change compared to yesterday. Cardio: regular rate and rhythm, S1, S2 normal, no murmur, click, rub or gallop GI: soft, non-tender; bowel sounds normal; no masses,  no organomegaly Extremities: extremities normal, atraumatic, no  cyanosis or edema Neurologic: Alert and oriented X 3, normal strength and tone. Normal symmetric reflexes. Normal coordination and gait  Lab Results:  Basic Metabolic Panel:  Recent Labs Lab 03/31/12 1512 03/31/12 2107 04/01/12 0500 04/04/12 0520  NA 136 136 139 137  K 3.3* 3.9 4.3 4.0  CL 99 102 106 100  CO2 24 23 24 27   GLUCOSE 117* 216* 144* 128*  BUN 12 14 15 15   CREATININE 0.76 0.74 0.72 0.77  CALCIUM 8.5 8.6 8.8 9.1  MG  --  2.0  --   --   PHOS  --  3.3  --   --    Liver Function Tests:  Recent Labs Lab 03/31/12 2107 04/01/12 0500  AST 19 16  ALT 12 11  ALKPHOS 133* 124*  BILITOT 0.3 0.2*  PROT 7.1 6.7  ALBUMIN 3.1* 3.1*   CBC:  Recent Labs Lab 03/31/12 1512 03/31/12 2107 04/01/12 0500  WBC 7.7 6.2 4.4  NEUTROABS 7.0 5.5  --   HGB 11.2* 11.2* 11.1*  HCT 35.1* 34.5* 35.1*  MCV 77.1* 77.0* 77.5*  PLT 202 195 201   CBG:  Recent Labs Lab 04/01/12 0547 04/02/12 0728 04/03/12 0727 04/04/12 0745  GLUCAP 126* 99 90 122*    No results found for this or any previous visit (from the past 240 hour(s)).  Studies/Results: No results found.  Medications:  Scheduled: . albuterol  2.5 mg Nebulization Q6H  . enoxaparin (LOVENOX) injection  40 mg Subcutaneous QHS  . famotidine  20 mg Oral QHS  . fluticasone  2 spray Each Nare Daily  . guaiFENesin  600 mg Oral BID  . ipratropium  0.5 mg Nebulization Q6H  . levofloxacin  750 mg Oral Daily  . methylPREDNISolone (SOLU-MEDROL) injection  60 mg Intravenous Q8H  . pantoprazole  40 mg Oral Daily   Continuous: . sodium chloride 10 mL/hr at 04/03/12 1211   NWG:[id number], acetaminophen, albuterol, benzonatate, ondansetron (ZOFRAN) IV, ondansetron  Assessment/Plan:  Principal Problem:   Acute asthma exacerbation Active Problems:   Hypokalemia    Acute asthma exacerbation  Slow to improve. Still wheezing significantly. Continue solumedrol and antibiotics. Continue nebs. RA sats are normal.  Suspect asthma was exacerbated by viral URI as patient reports that prior to having discomfort she had upper respiratory symptoms. Resume inhaled steroids.  Hypokalemia  Repleted.   DVT Prophylaxis Enoxaparin  Code Status: Full Code Family Communication: no family at bedside  Disposition Plan: Anticipate discharge in 1-2 days.    LOS: 4 days   [facility]  [facility] Pager [phone] 04/04/2012, 9:59 AM  If 8PM-8AM, please contact night-coverage at [email or web address], password [facility]

## 2012-04-05 MED ORDER — GUAIFENESIN 100 MG/5ML PO LIQD: 200.0000 mg | Freq: Three times a day (TID) | ORAL | Status: DC | PRN

## 2012-04-05 MED ORDER — BENZONATATE 100 MG PO CAPS: 100.0000 mg | ORAL_CAPSULE | Freq: Three times a day (TID) | ORAL | Status: DC

## 2012-04-05 MED ORDER — LEVOFLOXACIN 750 MG PO TABS: 750.0000 mg | ORAL_TABLET | Freq: Every day | ORAL | Status: DC

## 2012-04-05 MED ORDER — ALBUTEROL SULFATE (2.5 MG/3ML) 0.083% IN NEBU: 2.5000 mg | INHALATION_SOLUTION | Freq: Four times a day (QID) | RESPIRATORY_TRACT | Status: AC | PRN

## 2012-04-05 MED ORDER — PREDNISONE 20 MG PO TABS: ORAL_TABLET | ORAL | Status: DC

## 2012-04-05 NOTE — Discharge Summary (Signed)
[facility]  Physician Discharge Summary   Patient ID: [name] MRN: [id number] DOB/AGE: 07-31-1945 67 y.o.  Admit date: 03/31/2012 Discharge date: 04/05/2012  PCP: Paulino Rily, MD Pulmonologist: Dr. Sherene Sires  DISCHARGE DIAGNOSES:  Principal Problem:   Acute asthma exacerbation Active Problems:   Hypokalemia   RECOMMENDATIONS FOR OUTPATIENT FOLLOW UP: 1. Needs close follow up with PCP or pulmonologist. Patient will make appointment.  DISCHARGE CONDITION: fair  Diet recommendation: Low Sodium  Filed Weights   04/03/12 0600 04/04/12 0508 04/05/12 0510  Weight: 85.8 kg (189 lb 2.5 oz) 86 kg (189 lb 9.5 oz) 84.1 kg (185 lb 6.5 oz)    INITIAL HISTORY: 67 year old female with past medical history of asthma who presented with worsening shortness of breath for past few days prior to this admission associated with occasional productive cough. Patient reports no subjective or measured fevers, no chills, no chest pain, no palpitations. No reports of abdominal pain, no nausea or vomiting. In ED, CXR showed no acute cardiopulmonary findings, maybe questionable bronchitis. Initial O2 saturation was 89%.  Consultations:  None  Procedures:  None  HOSPITAL COURSE:   Acute asthma exacerbation  She was admitted to the hospital and started on oxygen, steroids and nebs. Antibiotics were added as well. Oxygen was titrated to off. She ambulated in the hallways. Oxygen saturations remains above 90%. She continues to wheeze but breathing is better. Has a predominantly dry cough. She will be discharged on a steroid taper. She was asked to follow up with either Dr. Yetta Barre or Dr. Sherene Sires next week.   Hypokalemia  Potassium was low at admission. This was repleted.  Overall patient is stable and improved. I have reassured her that the wheezing will eventually improve. She is stable for discharge with close follow up with her physician.   PERTINENT LABS:  The results of significant  diagnostics from this hospitalization (including imaging, microbiology, ancillary and laboratory) are listed below for reference.    Microbiology: No results found for this or any previous visit (from the past 240 hour(s)).   Labs: Basic Metabolic Panel:  Recent Labs Lab 03/31/12 1512 03/31/12 2107 04/01/12 0500 04/04/12 0520  NA 136 136 139 137  K 3.3* 3.9 4.3 4.0  CL 99 102 106 100  CO2 24 23 24 27   GLUCOSE 117* 216* 144* 128*  BUN 12 14 15 15   CREATININE 0.76 0.74 0.72 0.77  CALCIUM 8.5 8.6 8.8 9.1  MG  --  2.0  --   --   PHOS  --  3.3  --   --    Liver Function Tests:  Recent Labs Lab 03/31/12 2107 04/01/12 0500  AST 19 16  ALT 12 11  ALKPHOS 133* 124*  BILITOT 0.3 0.2*  PROT 7.1 6.7  ALBUMIN 3.1* 3.1*   CBC:  Recent Labs Lab 03/31/12 1512 03/31/12 2107 04/01/12 0500  WBC 7.7 6.2 4.4  NEUTROABS 7.0 5.5  --   HGB 11.2* 11.2* 11.1*  HCT 35.1* 34.5* 35.1*  MCV 77.1* 77.0* 77.5*  PLT 202 195 201   CBG:  Recent Labs Lab 04/01/12 0547 04/02/12 0728 04/03/12 0727 04/04/12 0745 04/05/12 0736  GLUCAP 126* 99 90 122* 119*     IMAGING STUDIES Dg Chest 2 View (if Patient Has Fever And/or Copd)  03/31/2012  *RADIOLOGY REPORT*  Clinical Data: Shortness of breath, wheezing, cough  CHEST - 2 VIEW  Comparison: Chest x-ray of 06/18/2009  Findings: No pneumonia is seen and no effusion is noted.  There is some prominence of perihilar markings, and bronchitis is a consideration. Mediastinal contours are stable.  The heart is within normal limits in size.  No skeletal abnormality is seen.  IMPRESSION: No pneumonia.  Question bronchitis.   Original Report Authenticated By: Dwyane Dee, M.D.     DISCHARGE EXAMINATION: Filed Vitals:   04/04/12 2323 04/05/12 0241 04/05/12 0510 04/05/12 0746  BP: 152/83  153/84   Pulse:   80   Temp:   97.7 F (36.5 C)   TempSrc:   Oral   Resp:   18   Height:      Weight:   84.1 kg (185 lb 6.5 oz)   SpO2:  94% 92% 93%    General appearance: alert, cooperative, appears stated age and no distress Resp: End exp wheezing bilaterally. Improved from yesterday. Cardio: regular rate and rhythm, S1, S2 normal, no murmur, click, rub or gallop Extremities: extremities normal, atraumatic, no cyanosis or edema Neurologic: A&O x 3. No focal deficits.  DISPOSITION: Home  Discharge Orders   Future Orders Complete By Expires     Diet - low sodium heart healthy  As directed     Discharge instructions  As directed     Comments:      Be sure to follow up with your physician next week. Seek attention if the breathing gets worse.    Increase activity slowly  As directed       Current Discharge Medication List    START taking these medications   Details  benzonatate (TESSALON) 100 MG capsule Take 1 capsule (100 mg total) by mouth 3 (three) times daily. Qty: 20 capsule, Refills: 0    guaiFENesin (ROBITUSSIN) 100 MG/5ML liquid Take 10 mLs (200 mg total) by mouth 3 (three) times daily as needed for cough. Qty: 120 mL, Refills: 0    levofloxacin (LEVAQUIN) 750 MG tablet Take 1 tablet (750 mg total) by mouth daily. Qty: 3 tablet, Refills: 0    predniSONE (DELTASONE) 20 MG tablet Take 3 tabs once daily for 3 days, then 2 tabs once daily for 4 days, then 1 tabs once daily for 4 days, and then stop. Qty: 21 tablet, Refills: 0      CONTINUE these medications which have CHANGED   Details  albuterol (PROVENTIL) (2.5 MG/3ML) 0.083% nebulizer solution Take 3 mLs (2.5 mg total) by nebulization every 6 (six) hours as needed. asthma Qty: 75 mL, Refills: 0      CONTINUE these medications which have NOT CHANGED   Details  budesonide-formoterol (SYMBICORT) 160-4.5 MCG/ACT inhaler Inhale 2 puffs into the lungs 2 (two) times daily. Qty: 3 Inhaler, Refills: 3    famotidine (PEPCID) 20 MG tablet Take 20 mg by mouth at bedtime.      fluticasone (FLONASE) 50 MCG/ACT nasal spray Place 2 sprays into the nose daily. Qty: 48 g,  Refills: 3    pantoprazole (PROTONIX) 40 MG tablet Take 1 tablet (40 mg total) by mouth daily before breakfast. Qty: 90 tablet, Refills: 3       Follow-up Information   Call Paulino Rily, MD. (As needed)    Contact information:   [address] [place] [address] [phone]       Follow up with Sandrea Hughs, MD. Schedule an appointment as soon as possible for a visit in 1 week.   Contact information:   [address] [place] [address] [phone]       TOTAL DISCHARGE TIME: 35 mins  Erik Burkett  [facility] Pager [phone]  04/05/2012, 9:51 AM

## 2012-04-08 LAB — GLUCOSE, CAPILLARY

## 2012-05-01 ENCOUNTER — Encounter: Payer: Self-pay | Admitting: Internal Medicine

## 2012-05-01 ENCOUNTER — Ambulatory Visit (INDEPENDENT_AMBULATORY_CARE_PROVIDER_SITE_OTHER): Payer: Medicare Other | Admitting: Internal Medicine

## 2012-05-01 VITALS — BP 120/66 | HR 77 | Temp 98.0°F | Ht 69.0 in | Wt 188.6 lb

## 2012-05-01 DIAGNOSIS — J45909 Unspecified asthma, uncomplicated: Secondary | ICD-10-CM

## 2012-05-01 NOTE — Patient Instructions (Addendum)
Symbicort is plan A  Plan B is your ventolin inhaler, only use if you need it  Plan C is nebulizer > ok to use if plan B fails for call appt here immediately  Please schedule a follow up visit in 3 months but call sooner if needed with pft's on return

## 2012-05-01 NOTE — Progress Notes (Signed)
Subjective:     Patient ID: [name], female   DOB: 05/31/1945    MRN: [id number]  HPI  69  yobf remote minimal smoker quit completely in the 1970's with dtc asthma since 2007 eval for the first time by the pulmonary service during admit [facility] in July 2012 with prominent upper airway "wheezing"  09/20/2010 f/u ov/Wert cc better breathing since discharge on symbicort 160 2 bid and tapering prednisone to be off by 8/4.  No purulent sputum. No need for saba daytime.   rec  GERD diet Work on Chemical engineer technique:  .Continue symbicort 160 Take 2 puffs first thing in am and then another 2 puffs about 12 hours later.  Protonix 40 mg Take 30-60 min before first meal of the day and Pepcid 20 mg on at bedtime Only use the tramadol if coughing Finish tapering prednisone off   11/08/2010 f/u ov/Wert cc doe only with steps.  Sleeping ok without nocturnal  or early am exac of resp c/o's or need for noct saba.  No cough rec Work on Musician technique:    11/08/2010 f/u ov/Wert cc cough and sob completely gone, no need for any saba daytime,typically has troubles this time of year but now on symbicort 160 2bid / singulair/ ppi/h2hs and doing great. rec Try off the singulair to see if it makes any difference in your respiratory or nasal symptoms once the first frost comes. Eliminate the protonix before supper but continue the dose before bfast and take pepcid at bedtime  02/08/2011 f/u ov/Wert did not stop singulair  cc  breathing doing well, no complaints - no need for daytime saba still on symbicort 160 2 bid and singulair, nasonex. No cough or urge to clear throat rec Ok try singulair off to see if it makes any difference > it didn't   05/09/2011 f/u ov/Wert ok off singulair but also  off protonix in am > some wheezing but no sob with exertion or sleeping,  No cough or need for daytime saba, no purulent sputum or active sinus or hb complaints. Some positional neck pain can't take  asa due to "throat swelling" rec If breathing worsens or any respiratory symptoms start back on protonix 40 mg  Take 30-60 min before first meal of the day  08/09/2011 f/u ov/Wert cc breathing improved, the best it's been in years. No cough or variability or need for daytime saba.  Sleeping ok without nocturnal  or early am exac of resp c/o's or need for noct saba.   rec Symbicort is plan A Plan B is your ventolin inhaler, only use if you need it Plan C is nebulizer > ok to use if plan B fails for call appt here immediately.  11/13/2011 f/u ov/Wert cc only uses bid symbicort 160 and no need for saba hfa or neb. rec No change rx   Admit date: 03/31/2012  Discharge date: 04/05/2012  DISCHARGE DIAGNOSES:  Principal Problem:  Acute asthma exacerbation   05/01/2012 f/u ov/Wert cc back to baseline after admit x 6 days just using symbicort 160 2 bid and rare need for albuterol   No obvious daytime variabilty or assoc chronic cough or cp or chest tightness, subjective wheeze overt sinus or hb symptoms. No unusual exp hx     Sleeping ok without nocturnal  or early am exacerbation  of respiratory  c/o's or need for noct saba. Also denies any obvious fluctuation of symptoms with weather or environmental changes or other  aggravating or alleviating factors except as outlined above   ROS  The following are not active complaints unless bolded sore throat, dysphagia, dental problems, itching, sneezing,  nasal congestion or excess/ purulent secretions, ear ache,   fever, chills, sweats, unintended wt loss, pleuritic or exertional cp, hemoptysis,  orthopnea pnd or leg swelling, presyncope, palpitations, heartburn, abdominal pain, anorexia, nausea, vomiting, diarrhea  or change in bowel or urinary habits, change in stools or urine, dysuria,hematuria,  rash, arthralgias, visual complaints, headache, numbness weakness or ataxia or problems with walking or coordination,  change in mood/affect or memory.         Objective:   Physical Exam  amb bf no longer  Hoarse, no longer pseudowheezing  wt   192  10/06/10 > 11/08/2010  192 > 02/08/2011 201> 05/09/2011  196> 08/09/2011  198 >11/13/2011 191 > 05/01/2012 188  HEENT mild turbinate edema.  Oropharynx no thrush or excess pnd or cobblestoning.  No JVD or cervical adenopathy. Mild accessory muscle hypertrophy. Trachea midline, nl thryroid. Chest was hyperinflated by percussion with diminished breath sounds and moderate increased exp time without wheeze. Hoover sign positive at mid inspiration. Regular rate and rhythm without murmur gallop or rub or increase P2 or edema.  Abd: no hsm, nl excursion. Ext warm without cyanosis or clubbing.    CXR  03/31/12 No pneumonia. Question bronchitis.   Assessment:         Plan:

## 2012-05-04 DIAGNOSIS — J45909 Unspecified asthma, uncomplicated: Secondary | ICD-10-CM | POA: Insufficient documentation

## 2012-05-04 NOTE — Assessment & Plan Note (Signed)
DDX of  difficult airways managment all start with A and  include Adherence, Ace Inhibitors, Acid Reflux, Active Sinus Disease, Alpha 1 Antitripsin deficiency, Anxiety masquerading as Airways dz,  ABPA,  allergy(esp in young), Aspiration (esp in elderly), Adverse effects of DPI,  Active smokers, plus two Bs  = Bronchiectasis and Beta blocker use..and one C= CHF   Adherence is always the initial "prime suspect" and is a multilayered concern that requires a "trust but verify" approach in every patient - starting with knowing how to use medications, especially inhalers, correctly, keeping up with refills and understanding the fundamental difference between maintenance and prns vs those medications only taken for a very short course and then stopped and not refilled.   The proper method of use, as well as anticipated side effects, of a metered-dose inhaler are discussed and demonstrated to the patient. Improved effectiveness after extensive coaching during this visit to a level of approximately  75% so should be ok on symbicort 160 2bid if simply remember her action plan (See instructions for specific recommendations which were reviewed directly with the patient who was given a copy with highlighter outlining the key components. )

## 2012-07-17 ENCOUNTER — Telehealth: Payer: Self-pay | Admitting: Internal Medicine

## 2012-07-17 NOTE — Telephone Encounter (Signed)
Called pt to schd follow up apt. Left message x3. Sent Letter 07/17/12 °

## 2012-08-19 ENCOUNTER — Other Ambulatory Visit: Payer: Self-pay | Admitting: Internal Medicine

## 2012-08-19 DIAGNOSIS — J45909 Unspecified asthma, uncomplicated: Secondary | ICD-10-CM

## 2012-08-20 ENCOUNTER — Encounter: Payer: Self-pay | Admitting: Internal Medicine

## 2012-08-20 ENCOUNTER — Ambulatory Visit (INDEPENDENT_AMBULATORY_CARE_PROVIDER_SITE_OTHER): Payer: Medicare Other | Admitting: Internal Medicine

## 2012-08-20 VITALS — BP 120/70 | HR 73 | Temp 97.0°F | Ht 68.0 in | Wt 186.0 lb

## 2012-08-20 DIAGNOSIS — J45909 Unspecified asthma, uncomplicated: Secondary | ICD-10-CM

## 2012-08-20 MED ORDER — BUDESONIDE-FORMOTEROL FUMARATE 160-4.5 MCG/ACT IN AERO: 2.0000 | INHALATION_SPRAY | Freq: Two times a day (BID) | RESPIRATORY_TRACT | Status: DC

## 2012-08-20 MED ORDER — PREDNISONE (PAK) 10 MG PO TABS: ORAL_TABLET | ORAL | Status: DC

## 2012-08-20 NOTE — Progress Notes (Signed)
Subjective:     Patient ID: [name], female   DOB: 21-Jan-1946    MRN: [id number]    Brief patient profile:  34  yobf remote minimal smoker quit completely in the 1970's with dtc asthma since 2007 eval for the first time by the pulmonary service during admit [facility] in July 2012 with prominent upper airway "wheezing"  09/20/2010 f/u ov/[name] cc better breathing since discharge on symbicort 160 2 bid and tapering prednisone to be off by 8/4.  No purulent sputum. No need for saba daytime.   rec  GERD diet Work on Chemical engineer technique:  .Continue symbicort 160 Take 2 puffs first thing in am and then another 2 puffs about 12 hours later.  Protonix 40 mg Take 30-60 min before first meal of the day and Pepcid 20 mg on at bedtime Only use the tramadol if coughing Finish tapering prednisone off   11/08/2010 f/u ov/[name] cc doe only with steps.  Sleeping ok without nocturnal  or early am exac of resp c/o's or need for noct saba.  No cough rec Work on Musician technique:    11/08/2010 f/u ov/[name] cc cough and sob completely gone, no need for any saba daytime,typically has troubles this time of year but now on symbicort 160 2bid / singulair/ ppi/h2hs and doing great. rec Try off the singulair to see if it makes any difference in your respiratory or nasal symptoms once the first frost comes. Eliminate the protonix before supper but continue the dose before bfast and take pepcid at bedtime  02/08/2011 f/u ov/[name] did not stop singulair  cc  breathing doing well, no complaints - no need for daytime saba still on symbicort 160 2 bid and singulair, nasonex. No cough or urge to clear throat rec Ok try singulair off to see if it makes any difference > it didn't   05/09/2011 f/u ov/[name] ok off singulair but also  off protonix in am > some wheezing but no sob with exertion or sleeping,  No cough or need for daytime saba, no purulent sputum or active sinus or hb complaints. Some positional  neck pain can't take asa due to "throat swelling" rec If breathing worsens or any respiratory symptoms start back on protonix 40 mg  Take 30-60 min before first meal of the day  08/09/2011 f/u ov/[name] cc breathing improved, the best it's been in years. No cough or variability or need for daytime saba.  Sleeping ok without nocturnal  or early am exac of resp c/o's or need for noct saba.   rec Symbicort is plan A Plan B is your ventolin inhaler, only use if you need it Plan C is nebulizer > ok to use if plan B fails for call appt here immediately.  11/13/2011 f/u ov/[name] cc only uses bid symbicort 160 and no need for saba hfa or neb. rec No change rx   Admit date: 03/31/2012  Discharge date: 04/05/2012  DISCHARGE DIAGNOSES:  Principal Problem:  Acute asthma exacerbation   05/01/2012 f/u ov/[name] cc back to baseline after admit x 6 days just using symbicort 160 2 bid and rare need for albuterol rec Symbicort is plan A  Plan B is your ventolin inhaler, only use if you need it  Plan C is nebulizer > ok to use if plan B fails for call appt here immediately   08/20/2012 f/u ov/[name] maintaining symbicort 160 2bid  Chief Complaint  Patient presents with  . Acute Visit  Pt c/o wheezing for the past several days. She c/o non prod cough also, but denies having any increased SOB.    sob acute onset  And now on saba couple times a day hfa, not neb. No pattern to cough, sob with more than slow adls  No obvious daytime variabilty or assoc  cp or chest tightness, subjective wheeze overt sinus or hb symptoms. No unusual exp hx     Sleeping ok without nocturnal  or early am exacerbation  of respiratory  c/o's or need for noct saba. Also denies any obvious fluctuation of symptoms with weather or environmental changes or other aggravating or alleviating factors except as outlined above   ROS  The following are not active complaints unless bolded sore throat, dysphagia, dental problems, itching,  sneezing,  nasal congestion or excess/ purulent secretions, ear ache,   fever, chills, sweats, unintended wt loss, pleuritic or exertional cp, hemoptysis,  orthopnea pnd or leg swelling, presyncope, palpitations, heartburn, abdominal pain, anorexia, nausea, vomiting, diarrhea  or change in bowel or urinary habits, change in stools or urine, dysuria,hematuria,  rash, arthralgias, visual complaints, headache, numbness weakness or ataxia or problems with walking or coordination,  change in mood/affect or memory.        Objective:   Physical Exam  amb bf no longer  Hoarse, no longer pseudowheezing  wt   192  10/06/10 > 11/08/2010  192 > 02/08/2011 201> 05/09/2011  196> 08/09/2011  198 >11/13/2011 191 > 05/01/2012 188 > 186 08/20/2012   HEENT mild turbinate edema.  Oropharynx no thrush or excess pnd or cobblestoning.  No JVD or cervical adenopathy. Mild accessory muscle hypertrophy. Trachea midline, nl thryroid. Chest was hyperinflated by percussion with diminished breath sounds and moderate increased exp time with insp and exp wheeze. Hoover sign positive at mid inspiration. Regular rate and rhythm without murmur gallop or rub or increase P2 or edema.  Abd: no hsm, nl excursion. Ext warm without cyanosis or clubbing.     CXR  03/31/12 No pneumonia. Question bronchitis.   Assessment:         Plan:

## 2012-08-20 NOTE — Patient Instructions (Addendum)
Symbicort is plan A Work on inhaler technique:  relax and gently blow all the way out then take a nice smooth deep breath back in, triggering the inhaler at same time you start breathing in.  Hold for up to 5 seconds if you can.  Rinse and gargle with water when done  Plan B is your ventolin inhaler, only use if you need it up to 2 puffs every 4 hours with goal less than twice weeks  Plan C is nebulizer > ok to use if plan B fails for call appt here immediately  Prednisone 10 mg take  4 each am x 2 days,   2 each am x 2 days,  1 each am x 2 days and stop   Please schedule a follow up visit in 6 weeks but call sooner if needed with pft's on return

## 2012-08-20 NOTE — Assessment & Plan Note (Addendum)
-   Sinus CT 09/12/10 Previous bilateral nasoantral windows. Mild mucosal inflammation,  particularly notable in the maxillary, ethmoid and sphenoid  sinuses. No advanced sinusitis or free fluid. - Barium swallow nl 09/12/10 - PFTs 08/09/2011 FEV1  1.24 (50%) ratio 68 and no change p B2, DLCO 67 improves to 133 c/w minimal airflow obstruction - hfa 75% 08/20/2012   DDX of  difficult airways managment all start with A and  include Adherence, Ace Inhibitors, Acid Reflux, Active Sinus Disease, Alpha 1 Antitripsin deficiency, Anxiety masquerading as Airways dz,  ABPA,  allergy(esp in young), Aspiration (esp in elderly), Adverse effects of DPI,  Active smokers, plus two Bs  = Bronchiectasis and Beta blocker use..and one C= CHF   Adherence is always the initial "prime suspect" and is a multilayered concern that requires a "trust but verify" approach in every patient - starting with knowing how to use medications, especially inhalers, correctly, keeping up with refills and understanding the fundamental difference between maintenance and prns vs those medications only taken for a very short course and then stopped and not refilled. The proper method of use, as well as anticipated side effects, of a metered-dose inhaler are discussed and demonstrated to the patient. Improved effectiveness after extensive coaching during this visit to a level of approximately  75% so needs to work harder on perfecting     Each maintenance medication was reviewed in detail including most importantly the difference between maintenance and as needed and under what circumstances the prns are to be used.  Please see instructions for details which were reviewed in writing and the patient given a copy.    For now will just rx with short term prednisone, no evidence of infection or clear reason she's exacerbating.

## 2012-10-01 ENCOUNTER — Encounter: Payer: Self-pay | Admitting: Internal Medicine

## 2012-10-01 ENCOUNTER — Ambulatory Visit (INDEPENDENT_AMBULATORY_CARE_PROVIDER_SITE_OTHER): Payer: Medicare Other | Admitting: Internal Medicine

## 2012-10-01 VITALS — BP 124/80 | HR 71 | Temp 97.3°F | Ht 68.25 in | Wt 190.0 lb

## 2012-10-01 DIAGNOSIS — J45909 Unspecified asthma, uncomplicated: Secondary | ICD-10-CM

## 2012-10-01 MED ORDER — ALBUTEROL SULFATE HFA 108 (90 BASE) MCG/ACT IN AERS: 2.0000 | INHALATION_SPRAY | Freq: Four times a day (QID) | RESPIRATORY_TRACT | Status: DC | PRN

## 2012-10-01 NOTE — Patient Instructions (Addendum)
Continue symbicort 160 Take 2 puffs first thing in am and then another 2 puffs about 12 hours later.   Only use your albuterol as a rescue medication to be used if you can't catch your breath by resting or doing a relaxed purse lip breathing pattern. The less you use it, the better it will work when you need it.   Please schedule a follow up visit in 6 months but call sooner if needed

## 2012-10-01 NOTE — Progress Notes (Signed)
PFT done today. 

## 2012-10-01 NOTE — Progress Notes (Signed)
Subjective:     Patient ID: [name], female   DOB: Apr 29, 1945    MRN: [id number]    Brief patient profile:  79  yobf remote minimal smoker quit completely in the 1970's with dtc asthma since 2007 eval for the first time by the pulmonary service during admit [facility] in July 2012 with prominent upper airway "wheezing" with essentially nl pft's 10/01/2012    HPI 09/20/2010 f/u ov/[name] cc better breathing since discharge on symbicort 160 2 bid and tapering prednisone to be off by 8/4.  No purulent sputum. No need for saba daytime.   rec  GERD diet Work on Chemical engineer technique:  .Continue symbicort 160 Take 2 puffs first thing in am and then another 2 puffs about 12 hours later.  Protonix 40 mg Take 30-60 min before first meal of the day and Pepcid 20 mg on at bedtime Only use the tramadol if coughing Finish tapering prednisone off   02/08/2011 f/u ov/[name] did not stop singulair  cc  breathing doing well, no complaints - no need for daytime saba still on symbicort 160 2 bid and singulair, nasonex. No cough or urge to clear throat rec Ok try singulair off to see if it makes any difference > it didn't        Admit date: 03/31/2012  Discharge date: 04/05/2012  DISCHARGE DIAGNOSES:  Principal Problem:  Acute asthma exacerbation   05/01/2012 f/u ov/[name] cc back to baseline after admit x 6 days just using symbicort 160 2 bid and rare need for albuterol rec Symbicort is plan A Plan B is your ventolin inhaler, only use if you need it Plan C is nebulizer > ok to use if plan B fails for call appt here immediately   08/20/2012 f/u ov/[name] maintaining symbicort 160 2bid  Chief Complaint  Patient presents with  . Acute Visit    Pt c/o wheezing for the past several days. She c/o non prod cough also, but denies having any increased SOB.    sob acute onset  And now on saba couple times a day hfa, not neb. No pattern to cough, sob with more than slow adls Symbicort is plan A Work on  inhaler technique:    Plan B is your ventolin inhaler, only use if you need it up to 2 puffs every 4 hours with goal less than twice weeks Plan C is nebulizer > ok to use if plan B fails for call appt here immediately Prednisone 10 mg take  4 each am x 2 days,   2 each am x 2 days,  1 each am x 2 days and stop     10/01/2012 f/u ov/[name] re asthma, completely back to baseline p above flare  Chief Complaint  Patient presents with  . Follow-up    PFTs today-- pt report breathing is doing well-- denies any other concerns at this time   No need for saba or neb, no limiting sob and fully functional including yardwork, housework.   No obvious daytime variabilty or assoc  cough cp or chest tightness, subjective wheeze overt sinus or hb symptoms. No unusual exp hx     Sleeping ok without nocturnal  or early am exacerbation  of respiratory  c/o's or need for noct saba. Also denies any obvious fluctuation of symptoms with weather or environmental changes or other aggravating or alleviating factors except as outlined above   ROS  The following are not active complaints unless bolded sore throat, dysphagia, dental  problems, itching, sneezing,  nasal congestion or excess/ purulent secretions, ear ache,   fever, chills, sweats, unintended wt loss, pleuritic or exertional cp, hemoptysis,  orthopnea pnd or leg swelling, presyncope, palpitations, heartburn, abdominal pain, anorexia, nausea, vomiting, diarrhea  or change in bowel or urinary habits, change in stools or urine, dysuria,hematuria,  rash, arthralgias, visual complaints, headache, numbness weakness or ataxia or problems with walking or coordination,  change in mood/affect or memory.        Objective:   Physical Exam  amb bf nad  wt   192  10/06/10 > 11/08/2010  192 > 02/08/2011 201> 05/09/2011  196> 08/09/2011  198 >11/13/2011 191 > 05/01/2012 188 > 186 08/20/2012 > 10/01/2012  190   HEENT: nl dentition, turbinates, and orophanx. Nl external ear canals  without cough reflex   NECK :  without JVD/Nodes/TM/ nl carotid upstrokes bilaterally   LUNGS: no acc muscle use, clear to A and P bilaterally without cough on insp or exp maneuvers   CV:  RRR  no s3 or murmur or increase in P2, no edema   ABD:  soft and nontender with nl excursion in the supine position. No bruits or organomegaly, bowel sounds nl  MS:  warm without deformities, calf tenderness, cyanosis or clubbing  SKIN: warm and dry without lesions    NEURO:  alert, approp, no deficits      CXR  03/31/12 No pneumonia. Question bronchitis.   Assessment:

## 2012-10-01 NOTE — Assessment & Plan Note (Signed)
-   Sinus CT 09/12/10 Previous bilateral nasoantral windows. Mild mucosal inflammation,  particularly notable in the maxillary, ethmoid and sphenoid  sinuses. No advanced sinusitis or free fluid. - Barium swallow nl 09/12/10 - PFTs 08/09/2011 FEV1  1.24 (50%) ratio 68 and no change p B2, DLCO 67 improves to 133 c/w minimal airflow obstruction - PFT's 10/01/2012 FEV2  1.63 (72%) ratio 76 and no change p B2, DLCO 76 improvede to 105   The proper method of use, as well as anticipated side effects, of a metered-dose inhaler are discussed and demonstrated to the patient. Improved effectiveness after extensive coaching during this visit to a level of approximately  90%   Remarkable improvement in baseline function and need for saba while on symbiocort, off singulair, and on a gerd rx.   Each maintenance medication was reviewed in detail including most importantly the difference between maintenance and as needed and under what circumstances the prns are to be used.  Please see instructions for details which were reviewed in writing and the patient given a copy.

## 2012-10-02 LAB — PULMONARY FUNCTION TEST

## 2013-05-20 ENCOUNTER — Encounter (INDEPENDENT_AMBULATORY_CARE_PROVIDER_SITE_OTHER): Payer: Self-pay

## 2013-05-20 ENCOUNTER — Ambulatory Visit (INDEPENDENT_AMBULATORY_CARE_PROVIDER_SITE_OTHER): Payer: Medicare Other | Admitting: Internal Medicine

## 2013-05-20 ENCOUNTER — Encounter: Payer: Self-pay | Admitting: Internal Medicine

## 2013-05-20 VITALS — BP 150/92 | HR 76 | Temp 98.1°F | Ht 69.75 in | Wt 204.0 lb

## 2013-05-20 DIAGNOSIS — J45909 Unspecified asthma, uncomplicated: Secondary | ICD-10-CM

## 2013-05-20 MED ORDER — ALBUTEROL SULFATE HFA 108 (90 BASE) MCG/ACT IN AERS: 2.0000 | INHALATION_SPRAY | Freq: Four times a day (QID) | RESPIRATORY_TRACT | Status: DC | PRN

## 2013-05-20 MED ORDER — BUDESONIDE-FORMOTEROL FUMARATE 160-4.5 MCG/ACT IN AERO: 2.0000 | INHALATION_SPRAY | Freq: Two times a day (BID) | RESPIRATORY_TRACT | Status: DC

## 2013-05-20 NOTE — Patient Instructions (Addendum)
Continue symbicort 160 Take 2 puffs first thing in am and then another 2 puffs about 12 hours later.   -Only use your albuterol (ventolin) as a rescue medication to be used if you can't catch your breath by resting or doing a relaxed purse lip breathing pattern.  - The less you use it, the better it will work when you need it. - Ok to use up to 2 puffs  every 4 hours if you must but call for immediate appointment if use goes up over your usual need - Don't leave home without it !!  (think of it like the spare tire for your car)    If you are satisfied with your treatment plan let your doctor know and he/she can either refill your medications or you can return here when your prescription runs out.     If in any way you are not 100% satisfied,  please tell us.  If 100% better, tell your friends!   Pulmonary follow up is as needed

## 2013-05-20 NOTE — Assessment & Plan Note (Signed)
-   Steroid "steroid dep" since 2008 (also for RA) > off at ov 01/09/2013    - PFTs 09/09/09 FEV1 2.23 (87%) ratio 86 and DLCO49 > corrects to 98%    - PFT's 08/29/10    FEV1  1.66  (66%), ratio 61, DLCO 37   - Spirometry 08/30/2011 FEV1 2.05 (92%) and no obstruction at all   - Sinus CT wnl 01/17/11 > repeat 02/15/2012 Improved chronic sinusitis> repeat  10/25/12 neg/ MRI neg sinus dz 12/09/12    - 05/20/2013 p extensive coaching HFA effectiveness =    90%   Adequate control on present rx, reviewed > no change in rx needed      Each maintenance medication was reviewed in detail including most importantly the difference between maintenance and as needed and under what circumstances the prns are to be used.  Please see instructions for details which were reviewed in writing and the patient given a copy.

## 2013-05-20 NOTE — Progress Notes (Signed)
Subjective:     Patient ID: [name], female   DOB: 30-Jun-1945    MRN: [id number]    Brief patient profile:  1  yobf remote minimal smoker quit completely in the 1970's with dtc asthma since 2007 eval for the first time by the pulmonary service during admit [facility] in July 2012 with prominent upper airway "wheezing" with essentially nl pft's 10/01/2012    HPI 09/20/2010 f/u ov/[name] cc better breathing since discharge on symbicort 160 2 bid and tapering prednisone to be off by 8/4.  No purulent sputum. No need for saba daytime.   rec  GERD diet Work on Doctor, hospital technique:  .Continue symbicort 160 Take 2 puffs first thing in am and then another 2 puffs about 12 hours later.  Protonix 40 mg Take 30-60 min before first meal of the day and Pepcid 20 mg on at bedtime Only use the tramadol if coughing Finish tapering prednisone off   02/08/2011 f/u ov/[name] did not stop singulair  cc  breathing doing well, no complaints - no need for daytime saba still on symbicort 160 2 bid and singulair, nasonex. No cough or urge to clear throat rec Ok try singulair off to see if it makes any difference > it didn't        Admit date: 03/31/2012  Discharge date: 04/05/2012  DISCHARGE DIAGNOSES:  Principal Problem:  Acute asthma exacerbation   05/01/2012 f/u ov/[name] cc back to baseline after admit x 6 days just using symbicort 160 2 bid and rare need for albuterol rec Symbicort is plan A Plan B is your ventolin inhaler, only use if you need it Plan C is nebulizer > ok to use if plan B fails for call appt here immediately   08/20/2012 f/u ov/[name] maintaining symbicort 160 2bid  Chief Complaint  Patient presents with  . Acute Visit    Pt c/o wheezing for the past several days. She c/o non prod cough also, but denies having any increased SOB.    sob acute onset  And now on saba couple times a day hfa, not neb. No pattern to cough, sob with more than slow adls Symbicort is plan A Work on  inhaler technique:    Plan B is your ventolin inhaler, only use if you need it up to 2 puffs every 4 hours with goal less than twice weeks Plan C is nebulizer > ok to use if plan B fails for call appt here immediately Prednisone 10 mg take  4 each am x 2 days,   2 each am x 2 days,  1 each am x 2 days and stop     10/01/2012 f/u ov/[name] re asthma, completely back to baseline p above flare  Chief Complaint  Patient presents with  . Follow-up    PFTs today-- pt report breathing is doing well-- denies any other concerns at this time   No need for saba or neb, no limiting sob and fully functional including yardwork, housework. rec Continue symbicort 160 Take 2 puffs first thing in am and then another 2 puffs about 12 hours later.  Only use your albuterol as a rescue medication  05/20/2013 f/u ov/[name] re: chronic asthma  Chief Complaint  Patient presents with  . Asthma    Breathing is doing well. Reports slight wheeze at this time. Denies SOB, coughing, chest tightness.   no need for saba, Not limited by breathing from desired activities    No obvious daytime variabilty or assoc  cough cp or chest tightness, subjective wheeze overt sinus or hb symptoms. No unusual exp hx     Sleeping ok without nocturnal  or early am exacerbation  of respiratory  c/o's or need for noct saba. Also denies any obvious fluctuation of symptoms with weather or environmental changes or other aggravating or alleviating factors except as outlined above   ROS  The following are not active complaints unless bolded sore throat, dysphagia, dental problems, itching, sneezing,  nasal congestion or excess/ purulent secretions, ear ache,   fever, chills, sweats, unintended wt loss, pleuritic or exertional cp, hemoptysis,  orthopnea pnd or leg swelling, presyncope, palpitations, heartburn, abdominal pain, anorexia, nausea, vomiting, diarrhea  or change in bowel or urinary habits, change in stools or urine, dysuria,hematuria,   rash, arthralgias, visual complaints, headache, numbness weakness or ataxia or problems with walking or coordination,  change in mood/affect or memory.        Objective:   Physical Exam  amb bf nad prominent pseudowheeze with fvc, eliminated with purse lip maneuver   wt   192  10/06/10 > 11/08/2010  192 > 02/08/2011 201> 05/09/2011  196> 08/09/2011  198 >11/13/2011 191 > 05/01/2012 188 > 186 08/20/2012 > 10/01/2012  190 > 05/20/2013 204   HEENT: nl dentition, turbinates, and orophanx. Nl external ear canals without cough reflex   NECK :  without JVD/Nodes/TM/ nl carotid upstrokes bilaterally   LUNGS: no acc muscle use, clear to A and P bilaterally without cough on insp or exp maneuvers   CV:  RRR  no s3 or murmur or increase in P2, no edema   ABD:  soft and nontender with nl excursion in the supine position. No bruits or organomegaly, bowel sounds nl  MS:  warm without deformities, calf tenderness, cyanosis or clubbing  SKIN: warm and dry without lesions    NEURO:  alert, approp, no deficits      CXR  03/31/12 No pneumonia. Question bronchitis.   Assessment:

## 2014-06-05 ENCOUNTER — Emergency Department (HOSPITAL_COMMUNITY)
Admission: EM | Admit: 2014-06-05 | Discharge: 2014-06-06 | Disposition: A | Discharge status: Home / Self Care | Payer: Medicare Other | Attending: Emergency Medicine | Admitting: Emergency Medicine

## 2014-06-05 ENCOUNTER — Emergency Department (HOSPITAL_COMMUNITY): Payer: Medicare Other

## 2014-06-05 ENCOUNTER — Encounter (HOSPITAL_COMMUNITY): Payer: Self-pay | Admitting: *Deleted

## 2014-06-05 DIAGNOSIS — Y929 Unspecified place or not applicable: Secondary | ICD-10-CM | POA: Diagnosis not present

## 2014-06-05 DIAGNOSIS — W1839XA Other fall on same level, initial encounter: Secondary | ICD-10-CM | POA: Diagnosis not present

## 2014-06-05 DIAGNOSIS — Y9354 Activity, bowling: Secondary | ICD-10-CM | POA: Insufficient documentation

## 2014-06-05 DIAGNOSIS — Z79899 Other long term (current) drug therapy: Secondary | ICD-10-CM | POA: Diagnosis not present

## 2014-06-05 DIAGNOSIS — Z7951 Long term (current) use of inhaled steroids: Secondary | ICD-10-CM | POA: Insufficient documentation

## 2014-06-05 DIAGNOSIS — J45901 Unspecified asthma with (acute) exacerbation: Secondary | ICD-10-CM | POA: Insufficient documentation

## 2014-06-05 DIAGNOSIS — S8251XA Displaced fracture of medial malleolus of right tibia, initial encounter for closed fracture: Secondary | ICD-10-CM

## 2014-06-05 DIAGNOSIS — S8991XA Unspecified injury of right lower leg, initial encounter: Secondary | ICD-10-CM | POA: Diagnosis present

## 2014-06-05 DIAGNOSIS — K219 Gastro-esophageal reflux disease without esophagitis: Secondary | ICD-10-CM | POA: Insufficient documentation

## 2014-06-05 DIAGNOSIS — S8254XA Nondisplaced fracture of medial malleolus of right tibia, initial encounter for closed fracture: Secondary | ICD-10-CM | POA: Diagnosis not present

## 2014-06-05 DIAGNOSIS — R011 Cardiac murmur, unspecified: Secondary | ICD-10-CM | POA: Diagnosis not present

## 2014-06-05 DIAGNOSIS — Y998 Other external cause status: Secondary | ICD-10-CM | POA: Insufficient documentation

## 2014-06-05 MED ORDER — HYDROCODONE-ACETAMINOPHEN 5-325 MG PO TABS: 1.0000 | ORAL_TABLET | Freq: Four times a day (QID) | ORAL | Status: DC | PRN

## 2014-06-05 MED ORDER — HYDROCODONE-ACETAMINOPHEN 5-325 MG PO TABS
2.0000 | ORAL_TABLET | Freq: Once | ORAL | Status: AC
  Administered 2014-06-05: 2 via ORAL
  Filled 2014-06-05: qty 2

## 2014-06-05 NOTE — ED Notes (Signed)
Pt st's she was playing the wee and turned her right ankle over.  Pt c/o pain and swelling to right ankle

## 2014-06-05 NOTE — ED Notes (Signed)
Ortho paged. 

## 2014-06-05 NOTE — Progress Notes (Signed)
Orthopedic Tech Progress Note Patient Details:  [name] 1946/01/10 [id number]  Ortho Devices Type of Ortho Device: Post (short leg) splint Ortho Device/Splint Interventions: Application   Katheren Shams 06/05/2014, 11:38 PM

## 2014-06-05 NOTE — ED Provider Notes (Signed)
CSN: [id number]     Arrival date & time 06/05/14  2134 History   First MD Initiated Contact with Patient 06/05/14 2203     Chief Complaint  Patient presents with  . Leg Injury    The history is provided by the patient. No language interpreter was used.   This chart was scribed for non-physician practitioner Hyman Bible, PA-C, working with Quintella Reichert, MD, by Thea Alken, ED Scribe. This patient was seen in room TR04C/TR04C and the patient's care was started at 10:08 PM.  [name] is a 69 y.o. female who presents to the Emergency Department complaining of right knee and ankle pain. Pt was playing bowling on the Wii when she fell, rolled her right ankle causing her to injure her right knee and right ankle. Pt states she landed on her right side and denies head impaction or LOC. She also denies hip, neck, or back pain.  She now has shooting pain from her knee to right ankle. She states she has been unable to walk and has been leaning on her son for assistance. Pt has not taken pain medication. Pt denies numbness to right leg.   Past Medical History  Diagnosis Date  . Asthma   . GERD (gastroesophageal reflux disease)   . Heart murmur    Past Surgical History  Procedure Laterality Date  . Tonsillectomy    . Tubal ligation    . Cataract extraction w/ intraocular lens  implant, bilateral  2011 &2009   Family History  Problem Relation Age of Onset  . Asthma Sister   . Emphysema Father     was a smoker  . Hypertension Mother   . Hypertension Sister    History  Substance Use Topics  . Smoking status: Never Smoker   . Smokeless tobacco: Never Used  . Alcohol Use: No   OB History    No data available     Review of Systems  Musculoskeletal: Positive for myalgias, arthralgias and gait problem.  Neurological: Negative for weakness and numbness.      Allergies  Aspirin  Home Medications   Prior to Admission medications   Medication Sig Start Date End Date Taking?  Authorizing Provider  albuterol (PROVENTIL HFA;VENTOLIN HFA) 108 (90 BASE) MCG/ACT inhaler Inhale 2 puffs into the lungs every 6 (six) hours as needed for wheezing. 05/20/13   Tanda Rockers, MD  albuterol (PROVENTIL) (2.5 MG/3ML) 0.083% nebulizer solution Take 3 mLs (2.5 mg total) by nebulization every 6 (six) hours as needed. asthma 04/05/12   Bonnielee Haff, MD  budesonide-formoterol [facility]) 160-4.5 MCG/ACT inhaler Inhale 2 puffs into the lungs 2 (two) times daily. 05/20/13 05/20/14  Tanda Rockers, MD  famotidine (PEPCID) 20 MG tablet Take 20 mg by mouth at bedtime.      Historical Provider, MD  fluticasone (FLONASE) 50 MCG/ACT nasal spray Place 2 sprays into the nose daily. 11/28/11   Tanda Rockers, MD  pantoprazole (PROTONIX) 40 MG tablet Take 1 tablet (40 mg total) by mouth daily before breakfast. 11/28/11   Tanda Rockers, MD   BP 126/103 mmHg  Pulse 84  Temp(Src) 97.8 F (36.6 C) (Oral)  Resp 18  Ht 5\' 9"  (1.753 m)  Wt 194 lb (87.998 kg)  BMI 28.64 kg/m2  SpO2 98% Physical Exam  Constitutional: She is oriented to person, place, and time. She appears well-developed and well-nourished. No distress.  HENT:  Head: Normocephalic and atraumatic.  Eyes: Conjunctivae and EOM are normal.  Neck:  Neck supple.  Cardiovascular: Normal rate, regular rhythm and normal heart sounds.   Pulses:      Dorsalis pedis pulses are 2+ on the right side.  Pulmonary/Chest: Effort normal. She has wheezes ( mild).  Musculoskeletal: Normal range of motion.       Right hip: She exhibits normal range of motion, no tenderness and no bony tenderness.       Right ankle: She exhibits swelling. She exhibits normal pulse.  TTP of the right knee. Mild edema to lateral aspect of right knee. No pain with ROM of right hip but pain with ROM of right knee. TTP and swelling of the right medial malleolus.  Neurological: She is alert and oriented to person, place, and time.  Distal sensation of toes intact.   Skin: Skin  is warm and dry.  Psychiatric: She has a normal mood and affect. Her behavior is normal.  Nursing note and vitals reviewed.   ED Course  Procedures (including critical care time) Labs Review Labs Reviewed - No data to display  Imaging Review Dg Ankle Complete Right  06/05/2014   CLINICAL DATA:  Injured tonight playing with her grandchild. Ankle pain and swelling.  EXAM: RIGHT ANKLE - COMPLETE 3+ VIEW  COMPARISON:  None.  FINDINGS: There is a nondisplaced fracture of the medial malleolus. There is a large joint effusion. No fracture of the fibula or talus.  IMPRESSION: Nondisplaced fracture of the medial malleolus extending to the articular surface.   Electronically Signed   By: Nelson Chimes M.D.   On: 06/05/2014 22:50   Dg Knee Complete 4 Views Right  06/05/2014   CLINICAL DATA:  Right knee pain after injury. Injury tonight playing Wii. Lateral pain.  EXAM: RIGHT KNEE - COMPLETE 4+ VIEW  COMPARISON:  None.  FINDINGS: No fracture or dislocation. The alignment and joint spaces are maintained. Mild spurring of the medial and lateral tibial femoral and patellofemoral compartments. Small joint effusion. Soft tissues are otherwise normal.  IMPRESSION: Small joint effusion and mild osteoarthritis.  No acute fracture.   Electronically Signed   By: Jeb Levering M.D.   On: 06/05/2014 22:50     EKG Interpretation None      MDM   Final diagnoses:  None  Patient presents today with right knee and ankle pain after rolling her right ankle while playing the Wii.  Xray of knee is negative for acute fracture.  Xray of ankle showing nondisplaced fracture of the medial malleolus.  Fracture is closed.  She is neurovascularly intact.  No pain with ROM of the hip.  Patient given short leg splint and crutches.  She was also instructed to use wheelchair.  Given referral to Orthopedics.  Return precautions given.  Patient also evaluated by Dr. Ralene Bathe who is in agreement with the plan.    I personally performed  the services described in this documentation, which was scribed in my presence. The recorded information has been reviewed and is accurate.    Hyman Bible, PA-C 06/07/14 [place], MD 06/07/14 1459

## 2014-06-05 NOTE — Discharge Instructions (Signed)
It is recommended that you follow up with Orthopedist.  Call next week to schedule an appointment.  Do not bear weight on the right leg.  Take pain medication as needed for severe pain.  Do not drive or operate heavy machinery for 4-6 hours after taking pain medication.

## 2014-06-05 NOTE — ED Notes (Signed)
The pt is c/o pain in her foot and up her entire rt leg.  She was playing with her grandson and felt something twist in her foot tonight since then she has had pain in her entire rt leg and foot

## 2014-09-18 ENCOUNTER — Ambulatory Visit: Payer: Medicare Other | Attending: Orthopaedic Surgery | Admitting: Physical Therapy

## 2014-09-18 DIAGNOSIS — R29898 Other symptoms and signs involving the musculoskeletal system: Secondary | ICD-10-CM | POA: Diagnosis present

## 2014-09-18 DIAGNOSIS — M25671 Stiffness of right ankle, not elsewhere classified: Secondary | ICD-10-CM | POA: Insufficient documentation

## 2014-09-18 DIAGNOSIS — M25571 Pain in right ankle and joints of right foot: Secondary | ICD-10-CM | POA: Insufficient documentation

## 2014-09-18 DIAGNOSIS — R2681 Unsteadiness on feet: Secondary | ICD-10-CM | POA: Insufficient documentation

## 2014-09-18 DIAGNOSIS — M25471 Effusion, right ankle: Secondary | ICD-10-CM | POA: Insufficient documentation

## 2014-09-18 NOTE — Patient Instructions (Signed)
   Beckham Buxbaum PT, DPT, LAT, ATC  [facility] Phone: [phone]     

## 2014-09-18 NOTE — Therapy (Signed)
[place], [place], [address] Phone: [phone]   Fax:  [phone]  Physical Therapy Evaluation  Patient Details  Name: [name] MRN: [id number] Date of Birth: 04/10/1945 Referring Provider:  Mcarthur Rossetti*  Encounter Date: 09/18/2014      PT End of Session - 09/18/14 1241    Visit Number 1   Number of Visits 12   Date for PT Re-Evaluation 10/30/14   PT Start Time 0930   PT Stop Time 1015   PT Time Calculation (min) 45 min   Activity Tolerance Patient limited by pain   Behavior During Therapy [facility] for tasks assessed/performed      Past Medical History  Diagnosis Date  . Asthma   . GERD (gastroesophageal reflux disease)   . Heart murmur     Past Surgical History  Procedure Laterality Date  . Tonsillectomy    . Tubal ligation    . Cataract extraction w/ intraocular lens  implant, bilateral  2011 &2009    There were no vitals filed for this visit.  Visit Diagnosis:  Right ankle pain - Plan: PT plan of care cert/re-cert  Weakness of right lower extremity - Plan: PT plan of care cert/re-cert  Unsteadiness - Plan: PT plan of care cert/re-cert  Right ankle swelling - Plan: PT plan of care cert/re-cert  Ankle stiffness, right - Plan: PT plan of care cert/re-cert      Subjective Assessment - 09/18/14 0939    Subjective pt is a 69 y.o F with CC of R ankle stiffness and pain secondary to malleloular fx that occured on april 15th from twisting around on her R foot and getting it caught in a long pair of pants.    Limitations Walking   How long can you sit comfortably? unlimited   How long can you stand comfortably? 10 min   How long can you walk comfortably? 10 min   Diagnostic tests x-ray at dr office in June per pt report that everything looked like it was healing   Patient Stated Goals to be able to walk without a cane, and have no pain   Currently in Pain? Yes   Pain Score  4    Pain Location Ankle   Pain Orientation Right   Pain Descriptors / Indicators Sharp;Burning   Pain Type Chronic pain   Pain Onset More than a month ago   Pain Frequency Intermittent   Aggravating Factors  standing, walking, stairs   Pain Relieving Factors elevate, alcohol, ice, epsom salt            [facility] PT Assessment - 09/18/14 0944    Assessment   Medical Diagnosis R medial malleolar fx   Onset Date/Surgical Date --  april 2016   Hand Dominance Right   Next MD Visit PRN   Prior Therapy no   Precautions   Precautions None   Restrictions   Weight Bearing Restrictions No   Balance Screen   Has the patient fallen in the past 6 months No   Has the patient had a decrease in activity level because of a fear of falling?  No   Is the patient reluctant to leave their home because of a fear of falling?  No   Home Ecologist residence   Living Arrangements Children;Other relatives   Available Help at Discharge Available 24 hours/day;Available PRN/intermittently   Type of Home House   Home Access Stairs to enter  Entrance Stairs-Number of Steps 6   Entrance Stairs-Rails Can reach both   Home Layout One level   [place] - single point   Prior Function   Level of Independence Independent;Independent with basic ADLs   Vocation Retired;Part time employment  social work   Biomedical scientist prolonged sitting   Leisure bowling, going out to eat, walking, and going to the gym   Cognition   Overall Cognitive Status Within Functional Limits for tasks assessed   Observation/Other Assessments   Lower Extremity Functional Scale  29/80   Observation/Other Assessments-Edema    Edema Figure 8   Figure 8 Edema   Figure 8 - Right  54.9cm   Posture/Postural Control   Posture/Postural Control Postural limitations   Postural Limitations Rounded Shoulders;Forward head   ROM / Strength   AROM / PROM / Strength AROM;Strength;PROM   AROM    AROM Assessment Site Ankle   Right/Left Ankle Right;Left   Right Ankle Dorsiflexion -10  from nuetral   Right Ankle Plantar Flexion 20   Right Ankle Inversion 8   Right Ankle Eversion 0   Left Ankle Dorsiflexion 4   Left Ankle Plantar Flexion 44   Left Ankle Inversion 24   Left Ankle Eversion 12   PROM   PROM Assessment Site Ankle   Right/Left Ankle Right;Left   Left Ankle Dorsiflexion -6  from nuetral   Left Ankle Plantar Flexion 26   Left Ankle Inversion 12   Left Ankle Eversion 4   Strength   Strength Assessment Site Ankle   Right/Left Ankle Right;Left   Left Ankle Dorsiflexion 5/5   Left Ankle Plantar Flexion 5/5   Left Ankle Inversion 5/5   Left Ankle Eversion 5/5   Standardized Balance Assessment   Standardized Balance Assessment 10 meter walk test   10 Meter Walk .62 M/s  16 sec                            PT Education - 09/18/14 1241    Education provided Yes   Education Details evaluation findings, POC, goals,HEP   Person(s) Educated Patient   Methods Explanation   Comprehension Verbalized understanding          PT Short Term Goals - 09/18/14 1247    PT SHORT TERM GOAL #1   Title pt will be I with basic HEP (10/09/2014)   Time 3   Period Weeks   Status New   PT SHORT TERM GOAL #2   Title pt will be able to verbalize and demonstrate techniques to reduce R ankle pain and swelling via RICE method (10/09/2014)   Time 3   Period Weeks   Status New   PT SHORT TERM GOAL #3   Title pt will increase LEFS score by > 5 points to indicate funcitonal progression (10/09/2014)   Time 3   Period Weeks   Status New           PT Long Term Goals - 09/18/14 1249    PT LONG TERM GOAL #1   Title upon discharge pt will be I with all HEP given throughout therapy (10/30/2014)   Time 6   Period Weeks   Status New   PT LONG TERM GOAL #2   Title pt will increase R ankle mobility by > 15 degrees in all planes to promote funcitonal and efficient amb  (10/30/2014)   Time 6   Period Weeks   Status  New   PT LONG TERM GOAL #3   Title She will increase R ankle Strength to > 4/5 to help with prolonged standing and walking activities (10/30/2014)   Time 6   Period Weeks   Status New   PT LONG TERM GOAL #4   Title pt will be able to tolerated walking/ standing with LRAD for > 20 minutes and/or navigate > 10 steps with < 2/10 pain to assist with community ambulation (10/30/2014)   Time 6   Period Weeks   Status New   PT LONG TERM GOAL #5   Title pt will increase her LEFS score by > 10 points to indicate improved functional capacity upon discharge 10/30/2014   Time 6   Period Weeks   Status New               Plan - October 03, 2014 1241    Clinical Impression Statement [name] presents to OPPT with CC of R ankle pain S/P medial malleloar fx that occured on 06/05/2014. She currently ambulates with a SPC with limited step length with LLE and decreased stance time on the RLE. AROM is severly limited in the  R ankle compared bil with pain in all directions. She exhibit edema noted at  54.9 cm of the R ankle. Palpation revealed tenderenss located at the distal fibula, and along the ankle mortis. MMT was not assessed due to lack or AROM. She would benefit from skilled physical therapy to maximize her function and decrease  pain by addressing the impairments listed.    Pt will benefit from skilled therapeutic intervention in order to improve on the following deficits Decreased activity tolerance;Decreased endurance;Decreased balance;Difficulty walking;Decreased range of motion;Decreased strength;Decreased mobility;Hypomobility;Pain;Improper body mechanics;Abnormal gait;Increased edema;Impaired flexibility   Rehab Potential Good   PT Frequency 2x / week   PT Duration 6 weeks   PT Treatment/Interventions ADLs/Self Care Home Management;Cryotherapy;Electrical Stimulation;Iontophoresis 4mg /ml Dexamethasone;Moist Heat;Ultrasound;Gait training;Stair  training;Functional mobility training;Therapeutic activities;Therapeutic exercise;Balance training;Neuromuscular re-education;Patient/family education;Manual techniques;Passive range of motion;Dry needling;Taping;Vasopneumatic Device   PT Next Visit Plan assess reponse to HEP, ankle mobilization, weight rocking/shifting, STM/edema reduction, KT tape? modalities PRN   PT Home Exercise Plan see HEP handout          G-Codes - Oct 03, 2014 1254    Functional Assessment Tool Used LEFS 29/80   Functional Limitation Mobility: Walking and moving around   Mobility: Walking and Moving Around Current Status (G1829) At least 60 percent but less than 80 percent impaired, limited or restricted   Mobility: Walking and Moving Around Goal Status [phone]) At least 20 percent but less than 40 percent impaired, limited or restricted       Problem List Patient Active Problem List   Diagnosis Date Noted  . Asthma with asa sensitivity 05/04/2012  . Acute asthma exacerbation 03/31/2012  . Hypokalemia 03/31/2012   Starr Lake PT, DPT, LAT, ATC  October 03, 2014  12:57 PM    [place] [facility] [address] [place], [place], [address] Phone: [phone]   Fax:  [phone]

## 2014-10-06 ENCOUNTER — Ambulatory Visit: Payer: Medicare Other | Admitting: Physical Therapy

## 2014-10-08 ENCOUNTER — Ambulatory Visit: Payer: Medicare Other | Attending: Orthopaedic Surgery | Admitting: Physical Therapy

## 2014-10-08 DIAGNOSIS — M25571 Pain in right ankle and joints of right foot: Secondary | ICD-10-CM | POA: Insufficient documentation

## 2014-10-08 DIAGNOSIS — M25671 Stiffness of right ankle, not elsewhere classified: Secondary | ICD-10-CM | POA: Insufficient documentation

## 2014-10-08 DIAGNOSIS — R29898 Other symptoms and signs involving the musculoskeletal system: Secondary | ICD-10-CM | POA: Diagnosis not present

## 2014-10-08 DIAGNOSIS — M25471 Effusion, right ankle: Secondary | ICD-10-CM | POA: Diagnosis present

## 2014-10-08 DIAGNOSIS — R2681 Unsteadiness on feet: Secondary | ICD-10-CM | POA: Diagnosis present

## 2014-10-08 NOTE — Therapy (Signed)
[place], [place], [address] Phone: [phone]   Fax:  [phone]  Physical Therapy Treatment  Patient Details  Name: [name] MRN: [id number] Date of Birth: 05/03/1945 Referring Brande Uncapher:  Kristie Cowman, MD  Encounter Date: 10/08/2014      PT End of Session - 10/08/14 1324    Visit Number 2   Number of Visits 12   Date for PT Re-Evaluation 10/30/14   PT Start Time 0802   PT Stop Time 0902   PT Time Calculation (min) 60 min   Activity Tolerance Patient tolerated treatment well;Patient limited by pain   Behavior During Therapy [facility] for tasks assessed/performed      Past Medical History  Diagnosis Date  . Asthma   . GERD (gastroesophageal reflux disease)   . Heart murmur     Past Surgical History  Procedure Laterality Date  . Tonsillectomy    . Tubal ligation    . Cataract extraction w/ intraocular lens  implant, bilateral  2011 &2009    There were no vitals filed for this visit.  Visit Diagnosis:  Weakness of right lower extremity  Right ankle pain  Unsteadiness  Right ankle swelling  Ankle stiffness, right      Subjective Assessment - 10/08/14 0808    Subjective Needs more bend for going down stairs.  Doing her exercise, they have helped with the motion.     Currently in Pain? No/denies   Pain Score 5    Pain Location Ankle  shoots up medial leg when getting up from sitting   Pain Orientation Right;Anterior;Lateral;Medial   Pain Descriptors / Indicators Dull;Burning  pulling   Pain Frequency Intermittent   Aggravating Factors  walking, steps going down   Pain Relieving Factors sleeping on pillows, ice epson salt   Multiple Pain Sites No                         [facility] Adult PT Treatment/Exercise - 10/08/14 0805    Ambulation/Gait   Stairs Yes   Cryotherapy   Number Minutes Cryotherapy 15 Minutes   Cryotherapy Location Ankle   Type of Cryotherapy --   cold pack   Manual Therapy   Manual therapy comments mulligan taping to hold fibula posterior, I - kinesiotex to hold mid foot at 50%,  mobilization with movement standing  with DF   Ankle Exercises: Stretches   Gastroc Stretch --  multiple reps with strap.   Ankle Exercises: Standing   Other Standing Ankle Exercises standing step stretch   Ankle Exercises: Seated   Toe Raise 10 reps                  PT Short Term Goals - 10/08/14 1326    PT SHORT TERM GOAL #1   Title pt will be I with basic HEP (10/09/2014)   Baseline independent   Time 3   Period Weeks   Status Achieved   PT SHORT TERM GOAL #2   Title pt will be able to verbalize and demonstrate techniques to reduce R ankle pain and swelling via RICE method (10/09/2014)   Time 3   Period Weeks   Status On-going   PT SHORT TERM GOAL #3   Title pt will increase LEFS score by > 5 points to indicate funcitonal progression (10/09/2014)   Time 3   Period Weeks   Status Unable to assess  PT Long Term Goals - 09/18/14 1249    PT LONG TERM GOAL #1   Title upon discharge pt will be I with all HEP given throughout therapy (10/30/2014)   Time 6   Period Weeks   Status New   PT LONG TERM GOAL #2   Title pt will increase R ankle mobility by > 15 degrees in all planes to promote funcitonal and efficient amb (10/30/2014)   Time 6   Period Weeks   Status New   PT LONG TERM GOAL #3   Title She will increase R ankle Strength to > 4/5 to help with prolonged standing and walking activities (10/30/2014)   Time 6   Period Weeks   Status New   PT LONG TERM GOAL #4   Title pt will be able to tolerated walking/ standing with LRAD for > 20 minutes and/or navigate > 10 steps with < 2/10 pain to assist with community ambulation (10/30/2014)   Time 6   Period Weeks   Status New   PT LONG TERM GOAL #5   Title pt will increase her LEFS score by > 10 points to indicate improved functional capacity upon discharge 10/30/2014   Time  6   Period Weeks   Status New               Plan - 10/08/14 1325    Clinical Impression Statement Tape helpful.  ROM improved. Feels the shoe fits her foot better and she has more movement with walking.   PT Next Visit Plan assess reponse to HEP, ankle mobilization, weight rocking/shifting, STM/edema reduction, KT tape assessment modalities PRN   Consulted and Agree with Plan of Care Patient        Problem List Patient Active Problem List   Diagnosis Date Noted  . Asthma with asa sensitivity 05/04/2012  . Acute asthma exacerbation 03/31/2012  . Hypokalemia 03/31/2012    HARRIS,KAREN 10/08/2014, 1:28 PM  [facility] [address] [place], [place], [address] Phone: [phone]   Fax:  [phone]     Melvenia Needles, PTA 10/08/2014 1:28 PM Phone: [phone] Fax: [phone]

## 2014-10-09 ENCOUNTER — Encounter: Payer: Self-pay | Admitting: Physical Therapy

## 2014-10-13 ENCOUNTER — Ambulatory Visit: Payer: Medicare Other | Admitting: Physical Therapy

## 2014-10-13 DIAGNOSIS — M25471 Effusion, right ankle: Secondary | ICD-10-CM

## 2014-10-13 DIAGNOSIS — R29898 Other symptoms and signs involving the musculoskeletal system: Secondary | ICD-10-CM | POA: Diagnosis not present

## 2014-10-13 DIAGNOSIS — R2681 Unsteadiness on feet: Secondary | ICD-10-CM

## 2014-10-13 DIAGNOSIS — M25671 Stiffness of right ankle, not elsewhere classified: Secondary | ICD-10-CM

## 2014-10-13 DIAGNOSIS — M25571 Pain in right ankle and joints of right foot: Secondary | ICD-10-CM

## 2014-10-13 NOTE — Therapy (Signed)
[place], [place], [address] Phone: [phone]   Fax:  [phone]  Physical Therapy Treatment  Patient Details  Name: [name] MRN: [id number] Date of Birth: 02-23-1945 Referring Provider:  Kristie Cowman, MD  Encounter Date: 10/13/2014      PT End of Session - 10/13/14 1259    Visit Number 3   Number of Visits 12   Date for PT Re-Evaluation 10/30/14   PT Start Time 1100   PT Stop Time 1150   PT Time Calculation (min) 50 min   Activity Tolerance Patient tolerated treatment well   Behavior During Therapy [facility] for tasks assessed/performed      Past Medical History  Diagnosis Date  . Asthma   . GERD (gastroesophageal reflux disease)   . Heart murmur     Past Surgical History  Procedure Laterality Date  . Tonsillectomy    . Tubal ligation    . Cataract extraction w/ intraocular lens  implant, bilateral  2011 &2009    There were no vitals filed for this visit.  Visit Diagnosis:  Weakness of right lower extremity  Right ankle pain  Unsteadiness  Right ankle swelling  Ankle stiffness, right      Subjective Assessment - 10/13/14 1112    Subjective " I have been doing better and have been practicing on doing my exercises and walking more"   Currently in Pain? Yes   Pain Score 4    Pain Location Ankle   Pain Orientation Right   Pain Descriptors / Indicators Dull;Burning   Pain Type Chronic pain   Pain Onset More than a month ago   Pain Frequency Intermittent   Aggravating Factors  walking, going down steps            [facility] PT Assessment - 10/13/14 0001    AROM   Right Ankle Dorsiflexion -8   Right Ankle Plantar Flexion 30   Right Ankle Inversion 12   Right Ankle Eversion 2                     [facility] Adult PT Treatment/Exercise - 10/13/14 0001    Modalities   Modalities Moist Heat   Moist Heat Therapy   Number Minutes Moist Heat 10 Minutes   Moist Heat  Location Ankle  R ankle   Manual Therapy   Manual Therapy Joint mobilization   Joint Mobilization grade 2 ankle mobilization in all directions and distraction   pt reported some tendnerness located at the distal fib   Ankle Exercises: Seated   Heel Raises --   Toe Raise --   BAPS Level 2;Sitting;10 reps  DF/PF, inverions/eversion, Clockwise x 10    BAPS Limitations VC to keep knee in place to avoid movement coming from hip   Ankle Exercises: Standing   Other Standing Ankle Exercises step ups x 10 with 2 inch step & 4 inch step leading with L forward and 10 x backward,   VC to control decsent touching toes down then lower on LLE                PT Education - 10/13/14 1258    Education provided Yes   Education Details updated HEP   Person(s) Educated Patient   Methods Explanation   Comprehension Verbalized understanding          PT Short Term Goals - 10/13/14 1302    PT SHORT TERM GOAL #1   Title pt  will be I with basic HEP (10/09/2014)   Baseline independent   Time 3   Period Weeks   Status Achieved   PT SHORT TERM GOAL #2   Title pt will be able to verbalize and demonstrate techniques to reduce R ankle pain and swelling via RICE method (10/09/2014)   Time 3   Period Weeks   Status Achieved   PT SHORT TERM GOAL #3   Title pt will increase LEFS score by > 5 points to indicate funcitonal progression (10/09/2014)   Time 3   Period Weeks   Status Unable to assess           PT Long Term Goals - 10/13/14 1302    PT LONG TERM GOAL #1   Title upon discharge pt will be I with all HEP given throughout therapy (10/30/2014)   Time 6   Period Weeks   Status On-going   PT LONG TERM GOAL #2   Title pt will increase R ankle mobility by > 15 degrees in all planes to promote funcitonal and efficient amb (10/30/2014)   Time 6   Period Weeks   Status On-going   PT LONG TERM GOAL #3   Title She will increase R ankle Strength to > 4/5 to help with prolonged standing and  walking activities (10/30/2014)   Time 6   Period Weeks   Status On-going   PT LONG TERM GOAL #4   Title pt will be able to tolerated walking/ standing with LRAD for > 20 minutes and/or navigate > 10 steps with < 2/10 pain to assist with community ambulation (10/30/2014)   Time 6   Period Weeks   Status On-going   PT LONG TERM GOAL #5   Title pt will increase her LEFS score by > 10 points to indicate improved functional capacity upon discharge 10/30/2014   Time 6   Period Weeks   Status On-going               Plan - 10/13/14 1259    Clinical Impression Statement [name] reports to therapy stating that she is doing better since the last visit and only has some difficulty with decescending steps. She met STG #2 today.  pt has improved her AROM in the R ankle with but continues to report pain at the distal fibular. She was able to perform all exercises except for marble pick-ups due to inablity of picking up the marbles with her toes, and towel scrunches. Pt was able to perform step up/down  forward and backward with VC to step down onto R foot and toes and then lowering heel onto the ground. Plan to progress treatment next visit as tolerated and continue progressing toward goals.    PT Next Visit Plan , ankle mobilization, weight rocking/shifting, STM/edema reduction, KT tape assessment modalities PRN, mobilization with movement   PT Home Exercise Plan home rocker board exercises   Consulted and Agree with Plan of Care Patient        Problem List Patient Active Problem List   Diagnosis Date Noted  . Asthma with asa sensitivity 05/04/2012  . Acute asthma exacerbation 03/31/2012  . Hypokalemia 03/31/2012   Starr Lake PT, DPT, LAT, ATC  10/13/2014  1:06 PM      [facility] [address] [place], [place], [address] Phone: [phone]   Fax:  [phone]

## 2014-10-16 ENCOUNTER — Ambulatory Visit: Payer: Medicare Other | Admitting: Physical Therapy

## 2014-10-16 DIAGNOSIS — R2681 Unsteadiness on feet: Secondary | ICD-10-CM

## 2014-10-16 DIAGNOSIS — M25571 Pain in right ankle and joints of right foot: Secondary | ICD-10-CM

## 2014-10-16 DIAGNOSIS — M25671 Stiffness of right ankle, not elsewhere classified: Secondary | ICD-10-CM

## 2014-10-16 DIAGNOSIS — R29898 Other symptoms and signs involving the musculoskeletal system: Secondary | ICD-10-CM | POA: Diagnosis not present

## 2014-10-16 DIAGNOSIS — M25471 Effusion, right ankle: Secondary | ICD-10-CM

## 2014-10-16 NOTE — Therapy (Signed)
[place], [place], [address] Phone: [phone]   Fax:  [phone]  Physical Therapy Treatment  Patient Details  Name: [name] MRN: [id number] Date of Birth: 1945-11-23 Referring Provider:  Kristie Cowman, MD  Encounter Date: 10/16/2014      PT End of Session - 10/16/14 1106    Visit Number 4   Number of Visits 12   Date for PT Re-Evaluation 10/30/14   PT Start Time 1103   PT Stop Time 0762   PT Time Calculation (min) 53 min      Past Medical History  Diagnosis Date  . Asthma   . GERD (gastroesophageal reflux disease)   . Heart murmur     Past Surgical History  Procedure Laterality Date  . Tonsillectomy    . Tubal ligation    . Cataract extraction w/ intraocular lens  implant, bilateral  2011 &2009    There were no vitals filed for this visit.  Visit Diagnosis:  Weakness of right lower extremity  Right ankle pain  Right ankle swelling  Ankle stiffness, right  Unsteadiness      Subjective Assessment - 10/16/14 1105    Subjective Pain has moved from ankle into a little pain across dorsal foot. I still have trouble with the stairs   Currently in Pain? No/denies                         [facility] Adult PT Treatment/Exercise - 10/16/14 1118    Neuro Re-ed    Neuro Re-ed Details  tandem balance trials, tandem walking forward and backward in parallel bars without UE support as well as side stepping   Modalities   Modalities Vasopneumatic   Vasopneumatic   Number Minutes Vasopneumatic  15 minutes   Vasopnuematic Location  Ankle   Vasopneumatic Pressure Medium   Vasopneumatic Temperature  32   Ankle Exercises: Aerobic   Stationary Bike Rec  5 min level1   Ankle Exercises: Standing   Other Standing Ankle Exercises stair training at mock stairs focusing or reciprocal pattern using bilateral UE. step down x 10 4 inch with bil UE- pt reports pinching in medial ankle-  no pain post just tingling in medial ankle after stairs    Ankle Exercises: Stretches   Gastroc Stretch 3 reps;20 seconds  runner stretch gently                  PT Short Term Goals - 10/13/14 1302    PT SHORT TERM GOAL #1   Title pt will be I with basic HEP (10/09/2014)   Baseline independent   Time 3   Period Weeks   Status Achieved   PT SHORT TERM GOAL #2   Title pt will be able to verbalize and demonstrate techniques to reduce R ankle pain and swelling via RICE method (10/09/2014)   Time 3   Period Weeks   Status Achieved   PT SHORT TERM GOAL #3   Title pt will increase LEFS score by > 5 points to indicate funcitonal progression (10/09/2014)   Time 3   Period Weeks   Status Unable to assess           PT Long Term Goals - 10/13/14 1302    PT LONG TERM GOAL #1   Title upon discharge pt will be I with all HEP given throughout therapy (10/30/2014)   Time 6   Period Weeks   Status  On-going   PT LONG TERM GOAL #2   Title pt will increase R ankle mobility by > 15 degrees in all planes to promote funcitonal and efficient amb (10/30/2014)   Time 6   Period Weeks   Status On-going   PT LONG TERM GOAL #3   Title She will increase R ankle Strength to > 4/5 to help with prolonged standing and walking activities (10/30/2014)   Time 6   Period Weeks   Status On-going   PT LONG TERM GOAL #4   Title pt will be able to tolerated walking/ standing with LRAD for > 20 minutes and/or navigate > 10 steps with < 2/10 pain to assist with community ambulation (10/30/2014)   Time 6   Period Weeks   Status On-going   PT LONG TERM GOAL #5   Title pt will increase her LEFS score by > 10 points to indicate improved functional capacity upon discharge 10/30/2014   Time 6   Period Weeks   Status On-going               Plan - 10/16/14 1109    Clinical Impression Statement Pt reports she is doing better. Trips to [employer] and grocerry shopping cause pain to increase to 6/10. Her trips  are more limited now than previous.    PT Next Visit Plan , ankle mobilization, weight rocking/shifting, STM/edema reduction, KT tape assessment modalities PRN, mobilization with movement        Problem List Patient Active Problem List   Diagnosis Date Noted  . Asthma with asa sensitivity 05/04/2012  . Acute asthma exacerbation 03/31/2012  . Hypokalemia 03/31/2012    Dorene Ar, PTA 10/16/2014, 12:00 PM  [place], [place], [address] Phone: [phone]   Fax:  [phone]

## 2014-10-20 ENCOUNTER — Ambulatory Visit: Payer: Medicare Other | Admitting: Physical Therapy

## 2014-10-20 DIAGNOSIS — M25571 Pain in right ankle and joints of right foot: Secondary | ICD-10-CM

## 2014-10-20 DIAGNOSIS — M25471 Effusion, right ankle: Secondary | ICD-10-CM

## 2014-10-20 DIAGNOSIS — R29898 Other symptoms and signs involving the musculoskeletal system: Secondary | ICD-10-CM

## 2014-10-20 DIAGNOSIS — M25671 Stiffness of right ankle, not elsewhere classified: Secondary | ICD-10-CM

## 2014-10-20 DIAGNOSIS — R2681 Unsteadiness on feet: Secondary | ICD-10-CM

## 2014-10-20 NOTE — Therapy (Signed)
[place], [place], [address] Phone: [phone]   Fax:  [phone]  Physical Therapy Treatment  Patient Details  Name: [name] MRN: [id number] Date of Birth: 05-18-1945 Referring Provider:  Kristie Cowman, MD  Encounter Date: 10/20/2014      PT End of Session - 10/20/14 1207    Visit Number 5   Number of Visits 12   Date for PT Re-Evaluation 10/30/14   PT Start Time 1100   PT Stop Time 1145   PT Time Calculation (min) 45 min   Activity Tolerance Patient tolerated treatment well   Behavior During Therapy [facility] for tasks assessed/performed      Past Medical History  Diagnosis Date  . Asthma   . GERD (gastroesophageal reflux disease)   . Heart murmur     Past Surgical History  Procedure Laterality Date  . Tonsillectomy    . Tubal ligation    . Cataract extraction w/ intraocular lens  implant, bilateral  2011 &2009    There were no vitals filed for this visit.  Visit Diagnosis:  Weakness of right lower extremity  Right ankle pain  Right ankle swelling  Ankle stiffness, right  Unsteadiness      Subjective Assessment - 10/20/14 1105    Subjective "I had some pain on the inside of the ankle that started on Sunday and still has it today but it is alot better"   Currently in Pain? Yes   Pain Score 4    Pain Location Ankle   Pain Orientation Right   Pain Descriptors / Indicators Dull;Burning   Pain Onset More than a month ago   Aggravating Factors  walking, going down steps                         [facility] Adult PT Treatment/Exercise - 10/20/14 0001    Manual Therapy   Manual Therapy Taping   Joint Mobilization grade 2-3 ankle mobilization in all directions and distraction    Kinesiotex Create Space;Edema   Kinesiotix   Edema fan over the anteromedial and anterolateral aspects of the R ankle   Ankle Exercises: Standing   Other Standing Ankle Exercises stair  training at mock stairs focusing or reciprocal pattern using bilateral UE. step down x 10 4 inch with bil UE- pt reports pinching in medial ankle- no pain post just tingling in medial ankle after stairs    Ankle Exercises: Stretches   Gastroc Stretch 3 reps;20 seconds   Ankle Exercises: Seated   Toe Raise 20 reps   BAPS Level 2;Sitting;10 reps   BAPS Limitations VC to keep knee in place to avoid movement coming from hip                PT Education - 10/20/14 1256    Education provided Yes   Education Details updated HEP   Person(s) Educated Patient   Methods Explanation   Comprehension Verbalized understanding          PT Short Term Goals - 10/13/14 1302    PT SHORT TERM GOAL #1   Title pt will be I with basic HEP (10/09/2014)   Baseline independent   Time 3   Period Weeks   Status Achieved   PT SHORT TERM GOAL #2   Title pt will be able to verbalize and demonstrate techniques to reduce R ankle pain and swelling via RICE method (10/09/2014)   Time 3  Period Weeks   Status Achieved   PT SHORT TERM GOAL #3   Title pt will increase LEFS score by > 5 points to indicate funcitonal progression (10/09/2014)   Time 3   Period Weeks   Status Unable to assess           PT Long Term Goals - 10/13/14 1302    PT LONG TERM GOAL #1   Title upon discharge pt will be I with all HEP given throughout therapy (10/30/2014)   Time 6   Period Weeks   Status On-going   PT LONG TERM GOAL #2   Title pt will increase R ankle mobility by > 15 degrees in all planes to promote funcitonal and efficient amb (10/30/2014)   Time 6   Period Weeks   Status On-going   PT LONG TERM GOAL #3   Title She will increase R ankle Strength to > 4/5 to help with prolonged standing and walking activities (10/30/2014)   Time 6   Period Weeks   Status On-going   PT LONG TERM GOAL #4   Title pt will be able to tolerated walking/ standing with LRAD for > 20 minutes and/or navigate > 10 steps with < 2/10  pain to assist with community ambulation (10/30/2014)   Time 6   Period Weeks   Status On-going   PT LONG TERM GOAL #5   Title pt will increase her LEFS score by > 10 points to indicate improved functional capacity upon discharge 10/30/2014   Time 6   Period Weeks   Status On-going               Plan - 10/20/14 [place] presents to therapy today with report of pain in the medial aspect of the ankle and along the posterior tibialis. She reported relief of pain in the medial ankle following STM with direct trigger point release of the posterior tib. Educated about self mobilizations with movement and how to perform with a therband.    PT Next Visit Plan response to KT tape, ankle mobilization, weight rocking/shifting, STM/edema reduction,modalities PRN, CKC strengthening activities   PT Home Exercise Plan standing ankle mobilizations with movement   Consulted and Agree with Plan of Care Patient        Problem List Patient Active Problem List   Diagnosis Date Noted  . Asthma with asa sensitivity 05/04/2012  . Acute asthma exacerbation 03/31/2012  . Hypokalemia 03/31/2012   Starr Lake PT, DPT, LAT, ATC  10/20/2014  1:14 PM      [facility] [address] [place], [place], [address] Phone: [phone]   Fax:  [phone]

## 2014-10-20 NOTE — Patient Instructions (Signed)
   Annelise Mccoy PT, DPT, LAT, ATC  [facility] Phone: [phone]     

## 2014-10-23 ENCOUNTER — Ambulatory Visit: Payer: Medicare Other | Attending: Orthopaedic Surgery | Admitting: Physical Therapy

## 2014-10-23 DIAGNOSIS — M25671 Stiffness of right ankle, not elsewhere classified: Secondary | ICD-10-CM

## 2014-10-23 DIAGNOSIS — R29898 Other symptoms and signs involving the musculoskeletal system: Secondary | ICD-10-CM

## 2014-10-23 DIAGNOSIS — R2681 Unsteadiness on feet: Secondary | ICD-10-CM | POA: Diagnosis present

## 2014-10-23 DIAGNOSIS — M25471 Effusion, right ankle: Secondary | ICD-10-CM | POA: Diagnosis present

## 2014-10-23 DIAGNOSIS — M25571 Pain in right ankle and joints of right foot: Secondary | ICD-10-CM | POA: Diagnosis present

## 2014-10-23 NOTE — Therapy (Signed)
[place], [place], [address] Phone: [phone]   Fax:  [phone]  Physical Therapy Treatment  Patient Details  Name: [name] MRN: [id number] Date of Birth: 10/30/1945 Referring Provider:  Kristie Cowman, MD  Encounter Date: 10/23/2014      PT End of Session - 10/23/14 1109    Visit Number 6   Number of Visits 12   Date for PT Re-Evaluation 10/30/14   PT Start Time 1102   PT Stop Time 1155   PT Time Calculation (min) 53 min      Past Medical History  Diagnosis Date  . Asthma   . GERD (gastroesophageal reflux disease)   . Heart murmur     Past Surgical History  Procedure Laterality Date  . Tonsillectomy    . Tubal ligation    . Cataract extraction w/ intraocular lens  implant, bilateral  2011 &2009    There were no vitals filed for this visit.  Visit Diagnosis:  Weakness of right lower extremity  Right ankle swelling  Ankle stiffness, right  Unsteadiness  Right ankle pain      Subjective Assessment - 10/23/14 1110    Subjective Its better. The tape helped alot. The pain is not a constant. It comes and goes. I feel I am walking the stairs better.    Currently in Pain? Yes   Pain Score 2    Pain Location Ankle   Pain Orientation Right;Medial   Aggravating Factors  stairs   Pain Relieving Factors rest            [facility] PT Assessment - 10/23/14 1133    Observation/Other Assessments   Lower Extremity Functional Scale  47/90   AROM   Right Ankle Dorsiflexion -4   Right Ankle Plantar Flexion 32   Right Ankle Inversion 20   Right Ankle Eversion 10   Strength   Right/Left Ankle Right   Right Ankle Dorsiflexion 4+/5   Right Ankle Inversion 4+/5   Right Ankle Eversion 4+/5                     [facility] Adult PT Treatment/Exercise - 10/23/14 0001    Neuro Re-ed    Neuro Re-ed Details  alternating step tape alternating and unilateral while on foam pad.    Cryotherapy   Number Minutes Cryotherapy 15 Minutes   Cryotherapy Location Ankle   Type of Cryotherapy Ice pack   Ankle Exercises: Standing   Other Standing Ankle Exercises stair training at mock stairs focusing or reciprocal pattern using bilateral UE. step down x 10 4 inch with bil UE- pt reports pinching in medial ankle   Other Standing Ankle Exercises resisted gait with 2 plates forward back and side x 3 each way   Ankle Exercises: Aerobic   Stationary Bike Rec  5 min level1                  PT Short Term Goals - 10/23/14 1204    PT SHORT TERM GOAL #1   Title pt will be I with basic HEP (10/09/2014)   Time 3   Period Weeks   Status Achieved   PT SHORT TERM GOAL #2   Title pt will be able to verbalize and demonstrate techniques to reduce R ankle pain and swelling via RICE method (10/09/2014)   Time 3   Period Weeks   Status Achieved   PT SHORT TERM GOAL #3   Title  pt will increase LEFS score by > 5 points to indicate funcitonal progression (10/09/2014)   Time 3   Period Weeks   Status Achieved           PT Long Term Goals - 10/23/14 1205    PT LONG TERM GOAL #1   Title upon discharge pt will be I with all HEP given throughout therapy (10/30/2014)   Time 6   Period Weeks   Status On-going   PT LONG TERM GOAL #2   Title pt will increase R ankle mobility by > 15 degrees in all planes to promote funcitonal and efficient amb (10/30/2014)   Time 6   Period Weeks   Status On-going   PT LONG TERM GOAL #3   Title She will increase R ankle Strength to > 4/5 to help with prolonged standing and walking activities (10/30/2014)   Time 6   Period Weeks   Status Partially Met   PT LONG TERM GOAL #4   Title pt will be able to tolerated walking/ standing with LRAD for > 20 minutes and/or navigate > 10 steps with < 2/10 pain to assist with community ambulation (10/30/2014)   Time 6   Period Weeks   Status On-going   PT LONG TERM GOAL #5   Title pt will increase her LEFS score  by > 10 points to indicate improved functional capacity upon discharge 10/30/2014   Time 6   Period Weeks   Status Achieved               Plan - 10/23/14 1116    Clinical Impression Statement Pt reports decreased pain with KT tape for edema applied. Pt reports less pain with walking. She demonstrates improved AROM and strength in right ankle. STG#3 met LTG#3 partially met LTG#5 Met. Some medial ankle burning after resisted gait activities today.    PT Next Visit Plan response to KT tape, ankle mobilization, weight rocking/shifting, STM/edema reduction,modalities PRN, CKC strengthening activities        Problem List Patient Active Problem List   Diagnosis Date Noted  . Asthma with asa sensitivity 05/04/2012  . Acute asthma exacerbation 03/31/2012  . Hypokalemia 03/31/2012    Dorene Ar, PTA 10/23/2014, 12:10 PM  [facility] [address] [place], [place], [address] Phone: [phone]   Fax:  [phone]

## 2014-10-27 ENCOUNTER — Ambulatory Visit: Payer: Medicare Other | Admitting: Physical Therapy

## 2014-10-27 DIAGNOSIS — R2681 Unsteadiness on feet: Secondary | ICD-10-CM

## 2014-10-27 DIAGNOSIS — M25671 Stiffness of right ankle, not elsewhere classified: Secondary | ICD-10-CM

## 2014-10-27 DIAGNOSIS — M25471 Effusion, right ankle: Secondary | ICD-10-CM

## 2014-10-27 DIAGNOSIS — R29898 Other symptoms and signs involving the musculoskeletal system: Secondary | ICD-10-CM

## 2014-10-27 DIAGNOSIS — M25571 Pain in right ankle and joints of right foot: Secondary | ICD-10-CM

## 2014-10-27 NOTE — Patient Instructions (Signed)
   Kinte Trim PT, DPT, LAT, ATC  [facility] Phone: [phone]     

## 2014-10-27 NOTE — Therapy (Signed)
[place], [place], [address] Phone: [phone]   Fax:  [phone]  Physical Therapy Treatment / Re-certification  Patient Details  Name: [name] MRN: [id number] Date of Birth: [birth year] Referring Provider:  Kristie Cowman, MD  Encounter Date: 10/27/2014      PT End of Session - 10/27/14 1216    Visit Number 7   Number of Visits 12   Date for PT Re-Evaluation 11/17/14   PT Start Time 9485   PT Stop Time 1100   PT Time Calculation (min) 45 min   Activity Tolerance Patient tolerated treatment well   Behavior During Therapy [facility] for tasks assessed/performed      Past Medical History  Diagnosis Date  . Asthma   . GERD (gastroesophageal reflux disease)   . Heart murmur     Past Surgical History  Procedure Laterality Date  . Tonsillectomy    . Tubal ligation    . Cataract extraction w/ intraocular lens  implant, bilateral  2011 &2009    There were no vitals filed for this visit.  Visit Diagnosis:  Weakness of right lower extremity - Plan: PT plan of care cert/re-cert  Right ankle swelling - Plan: PT plan of care cert/re-cert  Ankle stiffness, right - Plan: PT plan of care cert/re-cert  Unsteadiness - Plan: PT plan of care cert/re-cert  Right ankle pain - Plan: PT plan of care cert/re-cert      Subjective Assessment - 10/27/14 1025    Subjective "It feels alot better today, The tape and massage really made my ankle feel better" pt reports only having some pain during standing now.    Currently in Pain? Yes   Pain Score 1    Pain Location Ankle   Pain Orientation Right;Medial   Pain Descriptors / Indicators Aching;Sore   Pain Onset More than a month ago   Aggravating Factors  stairs   Pain Relieving Factors Rest            [facility] PT Assessment - 10/27/14 0001    Assessment   Medical Diagnosis R medial malleolar fx   Hand Dominance Right   Next MD Visit PRN   Prior Therapy  no   Precautions   Precautions None   Restrictions   Weight Bearing Restrictions No   Balance Screen   Has the patient fallen in the past 6 months No   Has the patient had a decrease in activity level because of a fear of falling?  No   Is the patient reluctant to leave their home because of a fear of falling?  No   Home Ecologist residence   Living Arrangements Children;Other relatives   Available Help at Discharge Available 24 hours/day;Available PRN/intermittently   Type of Home House   Home Access Stairs to enter   Entrance Stairs-Number of Steps 6   Entrance Stairs-Rails Can reach both   Home Layout One level   [place] - single point   Prior Function   Level of Independence Independent;Independent with basic ADLs   Vocation Retired;Part time employment   Vocation Requirements prolonged sitting   Leisure bowling, going out to eat, walking, and going to the gym   Cognition   Overall Cognitive Status Within Functional Limits for tasks assessed   Observation/Other Assessments   Lower Extremity Functional Scale  47/90   AROM   Right Ankle Dorsiflexion -2   Right Ankle Plantar Flexion 34  Right Ankle Inversion 22   Right Ankle Eversion 12   PROM   Left Ankle Dorsiflexion 0   Left Ankle Plantar Flexion 40   Left Ankle Inversion 24   Left Ankle Eversion 14   Strength   Right Ankle Dorsiflexion 4+/5   Right Ankle Inversion 4+/5   Right Ankle Eversion 4+/5   Ambulation/Gait   Stairs Yes                     [facility] Adult PT Treatment/Exercise - 10/27/14 1027    Static Standing Balance   Single Leg Stance - Right Leg 10  x 4, best time was 9 sec hold   Neuro Re-ed    Neuro Re-ed Details  alternating step tape alternating and unilateral while on foam pad.   x5   Manual Therapy   Manual Therapy Myofascial release   Joint Mobilization grade 2-3 ankle mobilization in all directions and distraction    Myofascial  Release trigger point release over origin of the posterior tibialis 6  reported sorness that dissipated following manula TPR   Kinesiotex Create Space;Edema   Kinesiotix   Edema fan over the anteromedial and anterolateral aspects of the R ankle   Ankle Exercises: Aerobic   Stationary Bike L 3 x 6 min   Ankle Exercises: Stretches   Soleus Stretch 1 rep;30 seconds   Gastroc Stretch 2 reps;30 seconds   Ankle Exercises: Standing   SLS 3 x 30 sec   Heel Raises 10 reps   Toe Raise 10 reps   Other Standing Ankle Exercises stair training at mock stairs focusing or reciprocal pattern using bilateral UE.  VC to PF the R foot while descending to control descent   Other Standing Ankle Exercises --   Ankle Exercises: Seated   Toe Raise 20 reps   BAPS Level 2;Sitting;10 reps   BAPS Limitations VC to keep knee in place to avoid movement coming from hip                PT Education - 10/27/14 1215    Education provided Yes   Education Details updated HEP   Person(s) Educated Patient   Methods Explanation   Comprehension Verbalized understanding          PT Short Term Goals - 10/23/14 1204    PT SHORT TERM GOAL #1   Title pt will be I with basic HEP (10/09/2014)   Time 3   Period Weeks   Status Achieved   PT SHORT TERM GOAL #2   Title pt will be able to verbalize and demonstrate techniques to reduce R ankle pain and swelling via RICE method (10/09/2014)   Time 3   Period Weeks   Status Achieved   PT SHORT TERM GOAL #3   Title pt will increase LEFS score by > 5 points to indicate funcitonal progression (10/09/2014)   Time 3   Period Weeks   Status Achieved           PT Long Term Goals - 10/27/14 1220    PT LONG TERM GOAL #1   Title upon discharge pt will be I with all HEP given throughout therapy (10/30/2014)   Time 6   Period Weeks   Status On-going   PT LONG TERM GOAL #2   Title pt will increase R ankle mobility by > 15 degrees in all planes to promote funcitonal and  efficient amb (10/30/2014)   Time 6   Period Weeks  Status Partially Met   PT LONG TERM GOAL #3   Title She will increase R ankle Strength to > 4/5 to help with prolonged standing and walking activities (10/30/2014)   Time 6   Period Weeks   Status Partially Met   PT LONG TERM GOAL #4   Title pt will be able to tolerated walking/ standing with LRAD for > 20 minutes and/or navigate > 10 steps with < 2/10 pain to assist with community ambulation (10/30/2014)   Time 6   Period Weeks   Status On-going   PT LONG TERM GOAL #5   Title pt will increase her LEFS score by > 10 points to indicate improved functional capacity upon discharge 10/30/2014   Time 6   Period Weeks   Status Achieved               Plan - 11/26/14 04-29-1215    Clinical Impression Statement Mrs. Fahey continues to make progress with decrased pain which she reports as 1/10 with increased AROM and LEFS score of 47/80. pt partially met LTG #2. She continues to report some soreness that is only present during walking with most tenderness at the origin of the posterior tibialis. Following STM She reported significant relief with standing/ walking.  Plan to continue with POC and progress as tolerated.    Pt will benefit from skilled therapeutic intervention in order to improve on the following deficits Decreased activity tolerance;Decreased endurance;Decreased balance;Difficulty walking;Decreased range of motion;Decreased strength;Decreased mobility;Hypomobility;Pain;Improper body mechanics;Abnormal gait;Increased edema;Impaired flexibility   Rehab Potential Good   PT Frequency 2x / week   PT Duration 3 weeks   PT Treatment/Interventions ADLs/Self Care Home Management;Cryotherapy;Electrical Stimulation;Iontophoresis 39m/ml Dexamethasone;Moist Heat;Ultrasound;Gait training;Stair training;Functional mobility training;Therapeutic activities;Therapeutic exercise;Balance training;Neuromuscular re-education;Patient/family education;Manual  techniques;Passive range of motion;Dry needling;Taping;Vasopneumatic Device   PT Next Visit Plan KT tape, ankle mobilization, Baps, STM/edema reduction,modalities PRN, CKC strengthening activities   PT Home Exercise Plan single leg stance   Consulted and Agree with Plan of Care Patient          G-Codes - 02016/10/0611221-03-09   Functional Assessment Tool Used LEFS 47/80   Functional Limitation Mobility: Walking and moving around   Mobility: Walking and Moving Around Current Status [phone] At least 40 percent but less than 60 percent impaired, limited or restricted   Mobility: Walking and Moving Around Goal Status [phone] At least 20 percent but less than 40 percent impaired, limited or restricted      Problem List Patient Active Problem List   Diagnosis Date Noted  . Asthma with asa sensitivity 05/04/2012  . Acute asthma exacerbation 03/31/2012  . Hypokalemia 03/31/2012   KStarr LakePT, DPT, LAT, ATC  02016/10/06 12:27 PM      [facility] NAl[place] [address]Phone: [phone]  Fax:  [phone]

## 2014-10-30 ENCOUNTER — Ambulatory Visit: Payer: Medicare Other | Admitting: Physical Therapy

## 2014-10-30 DIAGNOSIS — R2681 Unsteadiness on feet: Secondary | ICD-10-CM

## 2014-10-30 DIAGNOSIS — M25471 Effusion, right ankle: Secondary | ICD-10-CM

## 2014-10-30 DIAGNOSIS — M25571 Pain in right ankle and joints of right foot: Secondary | ICD-10-CM

## 2014-10-30 DIAGNOSIS — M25671 Stiffness of right ankle, not elsewhere classified: Secondary | ICD-10-CM

## 2014-10-30 DIAGNOSIS — R29898 Other symptoms and signs involving the musculoskeletal system: Secondary | ICD-10-CM

## 2014-10-30 NOTE — Therapy (Signed)
[place], [place], [address] Phone: [phone]   Fax:  [phone]  Physical Therapy Treatment  Patient Details  Name: [name] MRN: [id number] Date of Birth: 11-Dec-1945 Referring Provider:  Kristie Cowman, MD  Encounter Date: 10/30/2014      PT End of Session - 10/30/14 1210    Visit Number 8   Number of Visits 12   Date for PT Re-Evaluation 11/17/14   PT Start Time 3570   PT Stop Time 1115   PT Time Calculation (min) 60 min      Past Medical History  Diagnosis Date  . Asthma   . GERD (gastroesophageal reflux disease)   . Heart murmur     Past Surgical History  Procedure Laterality Date  . Tonsillectomy    . Tubal ligation    . Cataract extraction w/ intraocular lens  implant, bilateral  2011 &2009    There were no vitals filed for this visit.  Visit Diagnosis:  Weakness of right lower extremity  Right ankle swelling  Ankle stiffness, right  Unsteadiness  Right ankle pain      Subjective Assessment - 10/30/14 1034    Subjective Its better   Currently in Pain? No/denies   Aggravating Factors  stairs, prolonged walking   Pain Relieving Factors rest                         [facility] Adult PT Treatment/Exercise - 10/30/14 1037    Modalities   Modalities Moist Heat   Moist Heat Therapy   Number Minutes Moist Heat 15 Minutes   Moist Heat Location Other (comment)  calf   Manual Therapy   Manual Therapy Soft tissue mobilization   Soft tissue mobilization prone soft tissue work medial gastroc -multiple tender trigger points found   Ankle Exercises: Aerobic   Stationary Bike L 3 x 5 min   Ankle Exercises: Standing   Heel Raises 10 reps  edge of step   Other Standing Ankle Exercises stair training at mock stairs focusing or reciprocal pattern using 1 UE to simulate home stairs  no plantar pain if heel strikes the next step   Ankle Exercises: Stretches   Gastroc Stretch 2 reps;20 seconds  edge of step                PT Education - 10/30/14 1209    Education provided Yes   Education Details gastroc stretch at step 15 sec x 3 2 x per day   Person(s) Educated Patient   Methods Explanation   Comprehension Verbalized understanding;Returned demonstration          PT Short Term Goals - 10/23/14 1204    PT SHORT TERM GOAL #1   Title pt will be I with basic HEP (10/09/2014)   Time 3   Period Weeks   Status Achieved   PT SHORT TERM GOAL #2   Title pt will be able to verbalize and demonstrate techniques to reduce R ankle pain and swelling via RICE method (10/09/2014)   Time 3   Period Weeks   Status Achieved   PT SHORT TERM GOAL #3   Title pt will increase LEFS score by > 5 points to indicate funcitonal progression (10/09/2014)   Time 3   Period Weeks   Status Achieved           PT Long Term Goals - 10/27/14 1220    PT LONG TERM  GOAL #1   Title upon discharge pt will be I with all HEP given throughout therapy (10/30/2014)   Time 6   Period Weeks   Status On-going   PT LONG TERM GOAL #2   Title pt will increase R ankle mobility by > 15 degrees in all planes to promote funcitonal and efficient amb (10/30/2014)   Time 6   Period Weeks   Status Partially Met   PT LONG TERM GOAL #3   Title She will increase R ankle Strength to > 4/5 to help with prolonged standing and walking activities (10/30/2014)   Time 6   Period Weeks   Status Partially Met   PT LONG TERM GOAL #4   Title pt will be able to tolerated walking/ standing with LRAD for > 20 minutes and/or navigate > 10 steps with < 2/10 pain to assist with community ambulation (10/30/2014)   Time 6   Period Weeks   Status On-going   PT LONG TERM GOAL #5   Title pt will increase her LEFS score by > 10 points to indicate improved functional capacity upon discharge 10/30/2014   Time 6   Period Weeks   Status Achieved               Plan - 10/30/14 1206    Clinical  Impression Statement Pt presents with c/o 3/10 pain in plantar surface of foot only when descending stairs. She also c/o mild burning at medial ankle after 10 minutes of walking. Multiple trigger points found in medial gastroc. HMP placed on gastroc after manual therapy. Pt instructed in standing calf stretch at step for HEP. Also plantar surface pain resolved if pt uses heel strike when descending stairs. Practiced stairs with 1 HR like she has at home.    PT Next Visit Plan Review calf stretch at step, continue manual for trigger points in gastroc, heat/ice as needed, CKC strengthening activities.         Problem List Patient Active Problem List   Diagnosis Date Noted  . Asthma with asa sensitivity 05/04/2012  . Acute asthma exacerbation 03/31/2012  . Hypokalemia 03/31/2012    Dorene Ar, PTA 10/30/2014, 12:11 PM  [facility] [address] [place], [place], [address] Phone: [phone]   Fax:  [phone]

## 2014-11-03 ENCOUNTER — Ambulatory Visit: Payer: Medicare Other | Admitting: Physical Therapy

## 2014-11-03 DIAGNOSIS — R29898 Other symptoms and signs involving the musculoskeletal system: Secondary | ICD-10-CM | POA: Diagnosis not present

## 2014-11-03 DIAGNOSIS — M25471 Effusion, right ankle: Secondary | ICD-10-CM

## 2014-11-03 DIAGNOSIS — R2681 Unsteadiness on feet: Secondary | ICD-10-CM

## 2014-11-03 DIAGNOSIS — M25571 Pain in right ankle and joints of right foot: Secondary | ICD-10-CM

## 2014-11-03 DIAGNOSIS — M25671 Stiffness of right ankle, not elsewhere classified: Secondary | ICD-10-CM

## 2014-11-03 NOTE — Therapy (Addendum)
[place], [place], [address] Phone: [phone]   Fax:  [phone]  Physical Therapy Treatment  Patient Details  Name: [name] MRN: [id number] Date of Birth: 22-Sep-1945 Referring Provider:  Kristie Cowman, MD  Encounter Date: 11/03/2014      PT End of Session - 11/03/14 1021    Visit Number 9   Number of Visits 12   Date for PT Re-Evaluation 11/17/14   PT Start Time 5956   PT Stop Time 1100   PT Time Calculation (min) 45 min   Activity Tolerance Patient tolerated treatment well   Behavior During Therapy [facility] for tasks assessed/performed      Past Medical History  Diagnosis Date  . Asthma   . GERD (gastroesophageal reflux disease)   . Heart murmur     Past Surgical History  Procedure Laterality Date  . Tonsillectomy    . Tubal ligation    . Cataract extraction w/ intraocular lens  implant, bilateral  2011 &2009    There were no vitals filed for this visit.  Visit Diagnosis:  Weakness of right lower extremity  Right ankle swelling  Ankle stiffness, right  Unsteadiness  Right ankle pain      Subjective Assessment - 11/03/14 1020    Subjective "I am doing better and better every day"   Currently in Pain? No/denies   Pain Score 0-No pain   Pain Location Ankle                         [facility] Adult PT Treatment/Exercise - 11/03/14 1022    Static Standing Balance   Single Leg Stance - Right Leg --  4 x 10 sec, best time 20 sec   Manual Therapy   Soft tissue mobilization prone soft tissue work medial gastroc and posterior tib -multiple tender trigger points found   Ankle Exercises: Aerobic   Stationary Bike NuStep L6 x 6 min   Ankle Exercises: Standing   Heel Raises 20 reps  without holding on   Other Standing Ankle Exercises Heel walking/ toe walking 2 x 15 ft bil   Other Standing Ankle Exercises walking on slanted rubber mat in pediatric area 4 x 15 ft,  walking up and down hill forward and side-stepping bil up/down (incline out front of the building) x 2 ea.    Ankle Exercises: Stretches   Gastroc Stretch 30 seconds;4 reps  off edge of step, performed intermittently during tx   Ankle Exercises: Seated   Other Seated Ankle Exercises 4-way theraband strengthening 2 x 15 ea.                PT Education - 11/03/14 1104    Education provided Yes   Education Details HEP review   Person(s) Educated Patient   Methods Explanation   Comprehension Verbalized understanding          PT Short Term Goals - 10/23/14 1204    PT SHORT TERM GOAL #1   Title pt will be I with basic HEP (10/09/2014)   Time 3   Period Weeks   Status Achieved   PT SHORT TERM GOAL #2   Title pt will be able to verbalize and demonstrate techniques to reduce R ankle pain and swelling via RICE method (10/09/2014)   Time 3   Period Weeks   Status Achieved   PT SHORT TERM GOAL #3   Title pt will increase LEFS score by >  5 points to indicate funcitonal progression (10/09/2014)   Time 3   Period Weeks   Status Achieved           PT Long Term Goals - 10/27/14 1220    PT LONG TERM GOAL #1   Title upon discharge pt will be I with all HEP given throughout therapy (10/30/2014)   Time 6   Period Weeks   Status On-going   PT LONG TERM GOAL #2   Title pt will increase R ankle mobility by > 15 degrees in all planes to promote funcitonal and efficient amb (10/30/2014)   Time 6   Period Weeks   Status Partially Met   PT LONG TERM GOAL #3   Title She will increase R ankle Strength to > 4/5 to help with prolonged standing and walking activities (10/30/2014)   Time 6   Period Weeks   Status Partially Met   PT LONG TERM GOAL #4   Title pt will be able to tolerated walking/ standing with LRAD for > 20 minutes and/or navigate > 10 steps with < 2/10 pain to assist with community ambulation (10/30/2014)   Time 6   Period Weeks   Status On-going   PT LONG TERM GOAL #5    Title pt will increase her LEFS score by > 10 points to indicate improved functional capacity upon discharge 10/30/2014   Time 6   Period Weeks   Status Achieved               Plan - 11/03/14 1105    Clinical Impression Statement [name] continues to make great progress with decreased pain wither only intermittent tightness in the medial aspect of her R calf/shin. She reported decreased tightness following stretching and manual trigger point release of the posterior tib/gastroc. Focused on walking on different terrain waling on slants forward and backward and side stepping which she was able to perform but reported only tightness whihc was relieved following stretching. Discussed if she continues to do well that we are nearing her discharge, pt reponded she wants to be 100% better before she leaves.    PT Next Visit Plan  continue manual for trigger points in gastroc, heat/ice as needed, CKC strengthening activities, discuss possibility of DC.   PT Home Exercise Plan HEP review   Consulted and Agree with Plan of Care Patient        Problem List Patient Active Problem List   Diagnosis Date Noted  . Asthma with asa sensitivity 05/04/2012  . Acute asthma exacerbation 03/31/2012  . Hypokalemia 03/31/2012   Starr Lake PT, DPT, LAT, ATC  11/03/2014  11:13 AM      [facility] [address] [place], [place], [address] Phone: [phone]   Fax:  [phone]

## 2014-11-06 ENCOUNTER — Ambulatory Visit: Payer: Medicare Other | Admitting: Physical Therapy

## 2014-11-06 DIAGNOSIS — M25471 Effusion, right ankle: Secondary | ICD-10-CM

## 2014-11-06 DIAGNOSIS — M25671 Stiffness of right ankle, not elsewhere classified: Secondary | ICD-10-CM

## 2014-11-06 DIAGNOSIS — R29898 Other symptoms and signs involving the musculoskeletal system: Secondary | ICD-10-CM

## 2014-11-06 DIAGNOSIS — R2681 Unsteadiness on feet: Secondary | ICD-10-CM

## 2014-11-06 NOTE — Therapy (Signed)
[place], [place], [address] Phone: [phone]   Fax:  [phone]  Physical Therapy Treatment  Patient Details  Name: [name] MRN: [id number] Date of Birth: 1945/10/14 Referring Nuno Brubacher:  Kristie Cowman, MD  Encounter Date: 11/06/2014      PT End of Session - 11/06/14 1146    Visit Number 10   Number of Visits 12   Date for PT Re-Evaluation 11/17/14   PT Start Time 1100   PT Stop Time 1142   PT Time Calculation (min) 42 min      Past Medical History  Diagnosis Date  . Asthma   . GERD (gastroesophageal reflux disease)   . Heart murmur     Past Surgical History  Procedure Laterality Date  . Tonsillectomy    . Tubal ligation    . Cataract extraction w/ intraocular lens  implant, bilateral  2011 &2009    There were no vitals filed for this visit.  Visit Diagnosis:  Weakness of right lower extremity  Right ankle swelling  Ankle stiffness, right  Unsteadiness      Subjective Assessment - 11/06/14 1147    Subjective I am having more burning medial anke again                         [facility] Adult PT Treatment/Exercise - 11/06/14 1136    Manual Therapy   Soft tissue mobilization prone soft tissue work medial gastroc and posterior tib -multiple tender trigger points found   Kinesiotix   Create Space Posterior tib tendon tape to medial ankle to decrease tendonitis.    Ankle Exercises: Aerobic   Stationary Bike L 3 x 5 min                  PT Short Term Goals - 10/23/14 1204    PT SHORT TERM GOAL #1   Title pt will be I with basic HEP (10/09/2014)   Time 3   Period Weeks   Status Achieved   PT SHORT TERM GOAL #2   Title pt will be able to verbalize and demonstrate techniques to reduce R ankle pain and swelling via RICE method (10/09/2014)   Time 3   Period Weeks   Status Achieved   PT SHORT TERM GOAL #3   Title pt will increase LEFS score by > 5  points to indicate funcitonal progression (10/09/2014)   Time 3   Period Weeks   Status Achieved           PT Long Term Goals - 10/27/14 1220    PT LONG TERM GOAL #1   Title upon discharge pt will be I with all HEP given throughout therapy (10/30/2014)   Time 6   Period Weeks   Status On-going   PT LONG TERM GOAL #2   Title pt will increase R ankle mobility by > 15 degrees in all planes to promote funcitonal and efficient amb (10/30/2014)   Time 6   Period Weeks   Status Partially Met   PT LONG TERM GOAL #3   Title She will increase R ankle Strength to > 4/5 to help with prolonged standing and walking activities (10/30/2014)   Time 6   Period Weeks   Status Partially Met   PT LONG TERM GOAL #4   Title pt will be able to tolerated walking/ standing with LRAD for > 20 minutes and/or navigate > 10 steps with <  2/10 pain to assist with community ambulation (10/30/2014)   Time 6   Period Weeks   Status On-going   PT LONG TERM GOAL #5   Title pt will increase her LEFS score by > 10 points to indicate improved functional capacity upon discharge 10/30/2014   Time 6   Period Weeks   Status Achieved               Plan - 11/06/14 1144    Clinical Impression Statement pt presents with increased burning and tirgger points distal to proximal posterior tibialis after wearing normal flat shoes for 15 minutes. Manual to decrease trigger points and Kinesiotape to prevent posterior tib tendonitis applied. Pt reports improvement following.    PT Next Visit Plan assess benefit of new tape- may need to instruct pt on self application, is she wearing arch supports? Gcode was done 10/27/14        Problem List Patient Active Problem List   Diagnosis Date Noted  . Asthma with asa sensitivity 05/04/2012  . Acute asthma exacerbation 03/31/2012  . Hypokalemia 03/31/2012    Dorene Ar, PTA 11/06/2014, 11:47 AM  [facility] [address] [place], [place], [address] Phone: [phone]   Fax:  [phone]

## 2014-11-24 ENCOUNTER — Ambulatory Visit: Payer: Medicare Other | Attending: Orthopaedic Surgery | Admitting: Physical Therapy

## 2014-11-24 DIAGNOSIS — M25571 Pain in right ankle and joints of right foot: Secondary | ICD-10-CM

## 2014-11-24 DIAGNOSIS — M25471 Effusion, right ankle: Secondary | ICD-10-CM

## 2014-11-24 DIAGNOSIS — M25671 Stiffness of right ankle, not elsewhere classified: Secondary | ICD-10-CM | POA: Diagnosis present

## 2014-11-24 DIAGNOSIS — R29898 Other symptoms and signs involving the musculoskeletal system: Secondary | ICD-10-CM | POA: Diagnosis not present

## 2014-11-24 DIAGNOSIS — R2681 Unsteadiness on feet: Secondary | ICD-10-CM | POA: Insufficient documentation

## 2014-11-24 NOTE — Therapy (Signed)
[place], [place], [address] Phone: [phone]   Fax:  [phone]  Physical Therapy Treatment / discharge note  Patient Details  Name: [name] MRN: [id number] Date of Birth: Aug 23, 1945 Referring Provider:  Kristie Cowman, MD  Encounter Date: 11/24/2014      PT End of Session - 11/24/14 1005    Visit Number 11   Number of Visits 12   Date for PT Re-Evaluation 11/25/14   PT Start Time 0930   PT Stop Time 1000   PT Time Calculation (min) 30 min   Activity Tolerance Patient tolerated treatment well   Behavior During Therapy [facility] for tasks assessed/performed      Past Medical History  Diagnosis Date  . Asthma   . GERD (gastroesophageal reflux disease)   . Heart murmur     Past Surgical History  Procedure Laterality Date  . Tonsillectomy    . Tubal ligation    . Cataract extraction w/ intraocular lens  implant, bilateral  2011 &2009    There were no vitals filed for this visit.  Visit Diagnosis:  Weakness of right lower extremity - Plan: PT plan of care cert/re-cert  Right ankle swelling - Plan: PT plan of care cert/re-cert  Ankle stiffness, right - Plan: PT plan of care cert/re-cert  Unsteadiness - Plan: PT plan of care cert/re-cert  Right ankle pain - Plan: PT plan of care cert/re-cert      Subjective Assessment - 11/24/14 0936    Subjective " I am doing pretty good and haven't had much pain except some sorenes in the top outside of my foot that is only worse with walking"   Currently in Pain? Yes   Pain Score 2   with walking   Pain Location Ankle   Pain Orientation Right   Pain Descriptors / Indicators Aching   Pain Type Chronic pain   Pain Onset More than a month ago   Pain Frequency Intermittent   Aggravating Factors  walking   Pain Relieving Factors sitting and resting            [facility] PT Assessment - 11/24/14 0001    Observation/Other Assessments   Lower  Extremity Functional Scale  59/80   AROM   Right Ankle Dorsiflexion 2   Right Ankle Plantar Flexion 38   Right Ankle Inversion 24   Right Ankle Eversion 12   PROM   Left Ankle Dorsiflexion 3   Strength   Right Ankle Dorsiflexion 4+/5   Right Ankle Inversion 4+/5   Right Ankle Eversion 4+/5                     [facility] Adult PT Treatment/Exercise - 11/24/14 0001    Self-Care   Self-Care Other Self-Care Comments   Other Self-Care Comments  perform intermittent stretching and elevation to control tightness and swelling that may occur during/following activity.    Ankle Exercises: Aerobic   Stationary Bike L 3 x 5 min   Ankle Exercises: Standing   Other Standing Ankle Exercises Heel walking/ toe walking 2 x 15 ft bil   Other Standing Ankle Exercises walking on slanted rubber mat in pediatric area 4 x 15 ft, walking up and down hill forward and side-stepping bil up/down (incline out front of the building) x 2 ea.    Ankle Exercises: Stretches   Soleus Stretch 2 reps;30 seconds   Gastroc Stretch 2 reps;30 seconds   Other Stretch extensor hallicus  longus stretch 3 x 30 sec  in seated pos.                 PT Education - 28-Nov-2014 1004    Education provided Yes   Education Details HEP review, and update   Person(s) Educated Patient   Methods Explanation   Comprehension Verbalized understanding          PT Short Term Goals - 11/28/14 1009    PT SHORT TERM GOAL #1   Title pt will be I with basic HEP (10/09/2014)   Baseline independent   Time 3   Period Weeks   Status Achieved   PT SHORT TERM GOAL #2   Title pt will be able to verbalize and demonstrate techniques to reduce R ankle pain and swelling via RICE method (10/09/2014)   Time 3   Period Weeks   Status Achieved   PT SHORT TERM GOAL #3   Title pt will increase LEFS score by > 5 points to indicate funcitonal progression (10/09/2014)   Time 3   Period Weeks   Status Achieved           PT Long  Term Goals - 11-28-2014 1009    PT LONG TERM GOAL #1   Title upon discharge pt will be I with all HEP given throughout therapy (10/30/2014)   Time 6   Period Weeks   Status Achieved   PT LONG TERM GOAL #2   Title pt will increase R ankle mobility by > 15 degrees in all planes to promote funcitonal and efficient amb (10/30/2014)   Time 6   Period Weeks   Status Partially Met   PT LONG TERM GOAL #3   Title She will increase R ankle Strength to > 4/5 to help with prolonged standing and walking activities (10/30/2014)   Time 6   Status Achieved   PT LONG TERM GOAL #4   Title pt will be able to tolerated walking/ standing with LRAD for > 20 minutes and/or navigate > 10 steps with < 2/10 pain to assist with community ambulation (10/30/2014)   Time 6   Period Weeks   Status Achieved   PT LONG TERM GOAL #5   Title pt will increase her LEFS score by > 10 points to indicate improved functional capacity upon discharge 10/30/2014   Time 6   Period Weeks   Status Achieved               Plan - 11-28-2014 1005    Clinical Impression Statement Aidee presents to therapy with report that she has been doing well with some sorness in the top of her  R foot. tendnerness was present in the extensor hallicus longus tendon which was relieved following Great toe flexion stretching. She has met all goals except for partially meeting LTG# 2 and reports that she is able to maintain and progress her function independently and no longer requires physical therapy.    PT Next Visit Plan D/C   PT Home Exercise Plan HEP review, extensor hallicus longus stretching   Consulted and Agree with Plan of Care Patient          G-Codes - 2014/11/28 1058    Functional Assessment Tool Used LEFS 59/80   Functional Limitation Mobility: Walking and moving around   Mobility: Walking and Moving Around Goal Status [id number]) At least 20 percent but less than 40 percent impaired, limited or restricted   Mobility: Walking and Moving  Around Discharge Status (  [id number]) At least 20 percent but less than 40 percent impaired, limited or restricted      Problem List Patient Active Problem List   Diagnosis Date Noted  . Asthma with asa sensitivity 05/04/2012  . Acute asthma exacerbation 03/31/2012  . Hypokalemia 03/31/2012   Starr Lake PT, DPT, LAT, ATC  11/24/2014  11:03 AM      [facility] [address] [place], [place], [address] Phone: [phone]   Fax:  [phone]        PHYSICAL THERAPY DISCHARGE SUMMARY  Visits from Start of Care: 11  Current functional level related to goals / functional outcomes: LEFS 59/80   Remaining deficits: Intermittent tightness and swelling after walking/standing for long periods of time   Education / Equipment: HEP, theraband for strengthening, anatomy of the foot/ musculature  Plan: Patient agrees to discharge.  Patient goals were partially met. Patient is being discharged due to meeting the stated rehab goals.  ?????

## 2014-11-24 NOTE — Patient Instructions (Signed)
   Rehema Muffley PT, DPT, LAT, ATC  [facility] Phone: [phone]     

## 2014-11-26 ENCOUNTER — Ambulatory Visit: Payer: Medicare Other | Admitting: Physical Therapy

## 2014-12-01 ENCOUNTER — Encounter: Payer: Self-pay | Admitting: Physical Therapy

## 2014-12-04 ENCOUNTER — Encounter: Payer: Self-pay | Admitting: Physical Therapy

## 2015-02-21 ENCOUNTER — Emergency Department (HOSPITAL_COMMUNITY): Payer: Medicare Other

## 2015-02-21 ENCOUNTER — Emergency Department (HOSPITAL_COMMUNITY)
Admission: EM | Admit: 2015-02-21 | Discharge: 2015-02-21 | Disposition: A | Discharge status: Home / Self Care | Payer: Medicare Other | Source: Home / Self Care | Attending: Emergency Medicine | Admitting: Emergency Medicine

## 2015-02-21 ENCOUNTER — Encounter (HOSPITAL_COMMUNITY): Payer: Self-pay | Admitting: *Deleted

## 2015-02-21 DIAGNOSIS — J45901 Unspecified asthma with (acute) exacerbation: Secondary | ICD-10-CM | POA: Diagnosis not present

## 2015-02-21 LAB — CBC WITH DIFFERENTIAL/PLATELET
BASOS ABS: 0.1 10*3/uL (ref 0.0–0.1)
BASOS PCT: 1 %
EOS ABS: 0.2 10*3/uL (ref 0.0–0.7)
Eosinophils Relative: 3 %
HCT: 37.8 % (ref 36.0–46.0)
Hemoglobin: 12 g/dL (ref 12.0–15.0)
Lymphocytes Relative: 22 %
Lymphs Abs: 1.4 10*3/uL (ref 0.7–4.0)
MCH: 24.9 pg — ABNORMAL LOW (ref 26.0–34.0)
MCHC: 31.7 g/dL (ref 30.0–36.0)
MCV: 78.4 fL (ref 78.0–100.0)
Monocytes Absolute: 0.6 10*3/uL (ref 0.1–1.0)
Monocytes Relative: 10 %
NEUTROS PCT: 64 %
Neutro Abs: 4.1 10*3/uL (ref 1.7–7.7)
PLATELETS: 221 10*3/uL (ref 150–400)
RBC: 4.82 MIL/uL (ref 3.87–5.11)
RDW: 16.1 % — ABNORMAL HIGH (ref 11.5–15.5)
WBC: 6.4 10*3/uL (ref 4.0–10.5)

## 2015-02-21 LAB — BASIC METABOLIC PANEL
Anion gap: 11 (ref 5–15)
BUN: 11 mg/dL (ref 6–20)
CHLORIDE: 105 mmol/L (ref 101–111)
CO2: 22 mmol/L (ref 22–32)
CREATININE: 0.95 mg/dL (ref 0.44–1.00)
Calcium: 9 mg/dL (ref 8.9–10.3)
GFR, EST NON AFRICAN AMERICAN: 59 mL/min — AB (ref >60)
Glucose, Bld: 120 mg/dL — ABNORMAL HIGH (ref 65–99)
POTASSIUM: 3.9 mmol/L (ref 3.5–5.1)
SODIUM: 138 mmol/L (ref 135–145)

## 2015-02-21 MED ORDER — AZITHROMYCIN 250 MG PO TABS: 250.0000 mg | ORAL_TABLET | Freq: Every day | ORAL | Status: DC

## 2015-02-21 MED ORDER — PREDNISONE 20 MG PO TABS: 60.0000 mg | ORAL_TABLET | Freq: Every day | ORAL | Status: DC

## 2015-02-21 MED ORDER — ALBUTEROL (5 MG/ML) CONTINUOUS INHALATION SOLN
10.0000 mg/h | INHALATION_SOLUTION | Freq: Once | RESPIRATORY_TRACT | Status: AC
  Administered 2015-02-21: 10 mg/h via RESPIRATORY_TRACT
  Filled 2015-02-21: qty 20

## 2015-02-21 MED ORDER — METHYLPREDNISOLONE SODIUM SUCC 125 MG IJ SOLR
125.0000 mg | Freq: Once | INTRAMUSCULAR | Status: AC
  Administered 2015-02-21: 125 mg via INTRAVENOUS
  Filled 2015-02-21: qty 2

## 2015-02-21 NOTE — ED Provider Notes (Signed)
CSN: [id number]     Arrival date & time 02/21/15  [id number] History  By signing my name below, I, [name], attest that this documentation has been prepared under the direction and in the presence of Terri Greek, MD. Electronically Signed: Sonum Benitez, Education administrator. 02/21/2015. 3:59 AM.    Chief Complaint  Patient presents with  . Shortness of Breath   The history is provided by the patient. No language interpreter was used.     HPI Comments: [name] is a 70 y.o. female with past medical history of asthma who presents to the Emergency Department complaining of 2 days of intermittent productive cough and SOB that has worsened tonight. She reports associated subjective fever and chills along with chest pain secondary to cough. She has used 2 breathing treatment at home a few hours ago without relief. She is a nonsmoker.  Past Medical History  Diagnosis Date  . Asthma   . GERD (gastroesophageal reflux disease)   . Heart murmur    Past Surgical History  Procedure Laterality Date  . Tonsillectomy    . Tubal ligation    . Cataract extraction w/ intraocular lens  implant, bilateral  2011 &2009   Family History  Problem Relation Age of Onset  . Asthma Sister   . Emphysema Father     was a smoker  . Hypertension Mother   . Hypertension Sister    Social History  Substance Use Topics  . Smoking status: Never Smoker   . Smokeless tobacco: Never Used  . Alcohol Use: No   OB History    No data available     Review of Systems  Constitutional: Positive for fever (subjective) and chills.  Respiratory: Positive for cough and shortness of breath.   All other systems reviewed and are negative.  Allergies  Aspirin  Home Medications   Prior to Admission medications   Medication Sig Start Date End Date Taking? Authorizing Provider  albuterol (PROVENTIL HFA;VENTOLIN HFA) 108 (90 BASE) MCG/ACT inhaler Inhale 2 puffs into the lungs every 6 (six) hours as needed for wheezing.  05/20/13  Yes Tanda Rockers, MD  albuterol (PROVENTIL) (2.5 MG/3ML) 0.083% nebulizer solution Take 3 mLs (2.5 mg total) by nebulization every 6 (six) hours as needed. asthma 04/05/12  Yes Bonnielee Haff, MD  budesonide-formoterol [facility]) 160-4.5 MCG/ACT inhaler Inhale 2 puffs into the lungs 2 (two) times daily.   Yes Historical Provider, MD  fluticasone (FLONASE) 50 MCG/ACT nasal spray Place 2 sprays into the nose daily. Patient not taking: Reported on 02/21/2015 11/28/11   Tanda Rockers, MD  HYDROcodone-acetaminophen (NORCO/VICODIN) 5-325 MG per tablet Take 1-2 tablets by mouth every 6 (six) hours as needed. Patient not taking: Reported on 02/21/2015 06/05/14   Hyman Bible, PA-C  pantoprazole (PROTONIX) 40 MG tablet Take 1 tablet (40 mg total) by mouth daily before breakfast. Patient not taking: Reported on 02/21/2015 11/28/11   Tanda Rockers, MD   BP 113/68 mmHg  Pulse 129  Temp(Src) 98.6 F (37 C) (Oral)  Resp 32  SpO2 100% Physical Exam  Constitutional: She is oriented to person, place, and time. She appears well-developed and well-nourished. No distress.  HENT:  Head: Normocephalic and atraumatic.  Right Ear: Hearing normal.  Left Ear: Hearing normal.  Nose: Nose normal.  Mouth/Throat: Oropharynx is clear and moist and mucous membranes are normal.  Eyes: Conjunctivae and EOM are normal. Pupils are equal, round, and reactive to light.  Neck: Normal range of motion. Neck supple.  Cardiovascular: Regular rhythm, S1 normal and S2 normal.  Exam reveals no gallop and no friction rub.   No murmur heard. Pulmonary/Chest: Effort normal. No respiratory distress. She has wheezes. She exhibits no tenderness.  Adequate air movement but diffuse wheezing throughout both lung fields.   Abdominal: Soft. Normal appearance and bowel sounds are normal. There is no hepatosplenomegaly. There is no tenderness. There is no rebound, no guarding, no tenderness at McBurney's point and negative Murphy's sign.  No hernia.  Musculoskeletal: Normal range of motion.  Neurological: She is alert and oriented to person, place, and time. She has normal strength. No cranial nerve deficit or sensory deficit. Coordination normal. GCS eye subscore is 4. GCS verbal subscore is 5. GCS motor subscore is 6.  Skin: Skin is warm, dry and intact. No rash noted. No cyanosis.  Psychiatric: She has a normal mood and affect. Her speech is normal and behavior is normal. Thought content normal.  Nursing note and vitals reviewed.   ED Course  Procedures (including critical care time)  DIAGNOSTIC STUDIES: Oxygen Saturation is 95% on RA, adequate by my interpretation.    COORDINATION OF CARE: 4:03 AM Discussed treatment plan with pt at bedside and pt agreed to plan.   Labs Review Labs Reviewed  CBC WITH DIFFERENTIAL/PLATELET - Abnormal; Notable for the following:    MCH 24.9 (*)    RDW 16.1 (*)    All other components within normal limits  BASIC METABOLIC PANEL - Abnormal; Notable for the following:    Glucose, Bld 120 (*)    GFR calc non Af Amer 59 (*)    All other components within normal limits    Imaging Review Dg Chest Port 1 View  02/21/2015  CLINICAL DATA:  Acute onset of shortness of breath. Initial encounter. EXAM: PORTABLE CHEST 1 VIEW COMPARISON:  Chest radiograph performed 03/31/2012 FINDINGS: The lungs are hyperexpanded, with flattening of the hemidiaphragms, reflecting mild COPD. Mild bibasilar atelectasis is noted. There is no evidence of pleural effusion or pneumothorax. The cardiomediastinal silhouette is within normal limits. No acute osseous abnormalities are seen. IMPRESSION: Findings of COPD.  Mild bibasilar atelectasis noted. Electronically Signed   By: Garald Balding M.D.   On: 02/21/2015 04:53   I have personally reviewed and evaluated these images and lab results as part of my medical decision-making.   EKG Interpretation None      MDM   Final diagnoses:  None  Asthma  Patient  presents to the ER for evaluation of difficulty breathing. Patient reports a history of asthma. He reports that she developed cough and chest congestion today and then started having wheezing. Her inhaler did not relieve the symptoms at home so she presented to the ER. At arrival she had reasonable air movement with normal oxygenation but significant wheezing throughout all lung fields. Patient was administered Solu-Medrol and continuous nebulizer treatment. Repeat examination revealed improved aeration and decreased wheezing. Patient reports that she does feel like she is breathing better. She has not been hypoxic and does not appear to be in any significant distress currently, will be discharged with continued prednisone and albuterol therapy. Return for worsening shortness of breath.  I personally performed the services described in this documentation, which was scribed in my presence. The recorded information has been reviewed and is accurate.    Terri Greek, MD 02/21/15 [id number]

## 2015-02-21 NOTE — ED Notes (Signed)
MD at bedside. 

## 2015-02-21 NOTE — ED Notes (Signed)
The pt is c/o sob she has a hx of asthma.  Inhaler  And hhn at home not helpful tonight

## 2015-02-21 NOTE — Discharge Instructions (Signed)
Asthma, Adult Asthma is a condition of the lungs in which the airways tighten and narrow. Asthma can make it hard to breathe. Asthma cannot be cured, but medicine and lifestyle changes can help control it. Asthma may be started (triggered) by:  Animal skin flakes (dander).  Dust.  Cockroaches.  Pollen.  Mold.  Smoke.  Cleaning products.  Hair sprays or aerosol sprays.  Paint fumes or strong smells.  Cold air, weather changes, and winds.  Crying or laughing hard.  Stress.  Certain medicines or drugs.  Foods, such as dried fruit, potato chips, and sparkling grape juice.  Infections or conditions (colds, flu).  Exercise.  Certain medical conditions or diseases.  Exercise or tiring activities. HOME CARE   Take medicine as told by your doctor.  Use a peak flow meter as told by your doctor. A peak flow meter is a tool that measures how well the lungs are working.  Record and keep track of the peak flow meter's readings.  Understand and use the asthma action plan. An asthma action plan is a written plan for taking care of your asthma and treating your attacks.  To help prevent asthma attacks:  Do not smoke. Stay away from secondhand smoke.  Change your heating and air conditioning filter often.  Limit your use of fireplaces and wood stoves.  Get rid of pests (such as roaches and mice) and their droppings.  Throw away plants if you see mold on them.  Clean your floors. Dust regularly. Use cleaning products that do not smell.  Have someone vacuum when you are not home. Use a vacuum cleaner with a HEPA filter if possible.  Replace carpet with wood, tile, or vinyl flooring. Carpet can trap animal skin flakes and dust.  Use allergy-proof pillows, mattress covers, and box spring covers.  Wash bed sheets and blankets every week in hot water and dry them in a dryer.  Use blankets that are made of polyester or cotton.  Clean bathrooms and kitchens with bleach.  If possible, have someone repaint the walls in these rooms with mold-resistant paint. Keep out of the rooms that are being cleaned and painted.  Wash hands often. GET HELP IF:  You have make a whistling sound when breaking (wheeze), have shortness of breath, or have a cough even if taking medicine to prevent attacks.  The colored mucus you cough up (sputum) is thicker than usual.  The colored mucus you cough up changes from clear or white to yellow, green, gray, or bloody.  You have problems from the medicine you are taking such as:  A rash.  Itching.  Swelling.  Trouble breathing.  You need reliever medicines more than 2-3 times a week.  Your peak flow measurement is still at 50-79% of your personal best after following the action plan for 1 hour.  You have a fever. GET HELP RIGHT AWAY IF:   You seem to be worse and are not responding to medicine during an asthma attack.  You are short of breath even at rest.  You get short of breath when doing very little activity.  You have trouble eating, drinking, or talking.  You have chest pain.  You have a fast heartbeat.  Your lips or fingernails start to turn blue.  You are light-headed, dizzy, or faint.  Your peak flow is less than 50% of your personal best.   This information is not intended to replace advice given to you by your health care provider. Make sure   you discuss any questions you have with your health care provider.   Document Released: 07/26/2007 Document Revised: 10/28/2014 Document Reviewed: 09/05/2012 Elsevier Interactive Patient Education 2016 [employer]  

## 2015-02-23 ENCOUNTER — Encounter (HOSPITAL_COMMUNITY): Payer: Self-pay | Admitting: Emergency Medicine

## 2015-02-23 ENCOUNTER — Emergency Department (HOSPITAL_COMMUNITY): Payer: Medicare Other

## 2015-02-23 ENCOUNTER — Inpatient Hospital Stay (HOSPITAL_COMMUNITY)
Admission: EM | Admit: 2015-02-23 | Discharge: 2015-02-27 | DRG: 203 | Disposition: A | Discharge status: Home / Self Care | Payer: Medicare Other | Attending: Internal Medicine | Admitting: Internal Medicine

## 2015-02-23 DIAGNOSIS — K219 Gastro-esophageal reflux disease without esophagitis: Secondary | ICD-10-CM | POA: Diagnosis present

## 2015-02-23 DIAGNOSIS — T380X5A Adverse effect of glucocorticoids and synthetic analogues, initial encounter: Secondary | ICD-10-CM | POA: Diagnosis present

## 2015-02-23 DIAGNOSIS — R739 Hyperglycemia, unspecified: Secondary | ICD-10-CM | POA: Diagnosis not present

## 2015-02-23 DIAGNOSIS — Z79891 Long term (current) use of opiate analgesic: Secondary | ICD-10-CM

## 2015-02-23 DIAGNOSIS — Z886 Allergy status to analgesic agent status: Secondary | ICD-10-CM

## 2015-02-23 DIAGNOSIS — J45901 Unspecified asthma with (acute) exacerbation: Secondary | ICD-10-CM | POA: Diagnosis not present

## 2015-02-23 DIAGNOSIS — Z79899 Other long term (current) drug therapy: Secondary | ICD-10-CM

## 2015-02-23 DIAGNOSIS — D751 Secondary polycythemia: Secondary | ICD-10-CM | POA: Diagnosis not present

## 2015-02-23 LAB — CBC WITH DIFFERENTIAL/PLATELET
Basophils Absolute: 0.1 10*3/uL (ref 0.0–0.1)
Basophils Relative: 1 %
EOS ABS: 0 10*3/uL (ref 0.0–0.7)
Eosinophils Relative: 0 %
HCT: 52.9 % — ABNORMAL HIGH (ref 36.0–46.0)
Hemoglobin: 16.8 g/dL — ABNORMAL HIGH (ref 12.0–15.0)
Lymphocytes Relative: 10 %
Lymphs Abs: 0.6 10*3/uL — ABNORMAL LOW (ref 0.7–4.0)
MCH: 25.1 pg — AB (ref 26.0–34.0)
MCHC: 31.8 g/dL (ref 30.0–36.0)
MCV: 79.1 fL (ref 78.0–100.0)
MONO ABS: 0.2 10*3/uL (ref 0.1–1.0)
Monocytes Relative: 3 %
NEUTROS PCT: 86 %
Neutro Abs: 5.2 10*3/uL (ref 1.7–7.7)
PLATELETS: 115 10*3/uL — AB (ref 150–400)
RBC: 6.69 MIL/uL — AB (ref 3.87–5.11)
RDW: 16.4 % — AB (ref 11.5–15.5)
WBC: 6.1 10*3/uL (ref 4.0–10.5)

## 2015-02-23 LAB — BASIC METABOLIC PANEL
Anion gap: 10 (ref 5–15)
BUN: 21 mg/dL — ABNORMAL HIGH (ref 6–20)
CALCIUM: 9 mg/dL (ref 8.9–10.3)
CO2: 24 mmol/L (ref 22–32)
Chloride: 109 mmol/L (ref 101–111)
Creatinine, Ser: 1.09 mg/dL — ABNORMAL HIGH (ref 0.44–1.00)
GFR, EST AFRICAN AMERICAN: 58 mL/min — AB (ref >60)
GFR, EST NON AFRICAN AMERICAN: 50 mL/min — AB (ref >60)
Glucose, Bld: 135 mg/dL — ABNORMAL HIGH (ref 65–99)
Potassium: 4.1 mmol/L (ref 3.5–5.1)
SODIUM: 143 mmol/L (ref 135–145)

## 2015-02-23 MED ORDER — IPRATROPIUM-ALBUTEROL 0.5-2.5 (3) MG/3ML IN SOLN
3.0000 mL | Freq: Once | RESPIRATORY_TRACT | Status: AC
  Administered 2015-02-23: 3 mL via RESPIRATORY_TRACT
  Filled 2015-02-23: qty 3

## 2015-02-23 MED ORDER — ONDANSETRON HCL 4 MG PO TABS: 4.0000 mg | ORAL_TABLET | Freq: Four times a day (QID) | ORAL | Status: DC | PRN

## 2015-02-23 MED ORDER — ALBUTEROL (5 MG/ML) CONTINUOUS INHALATION SOLN
20.0000 mg/h | INHALATION_SOLUTION | RESPIRATORY_TRACT | Status: DC
  Administered 2015-02-23: 20 mg/h via RESPIRATORY_TRACT
  Filled 2015-02-23: qty 20

## 2015-02-23 MED ORDER — IPRATROPIUM BROMIDE 0.02 % IN SOLN: 10.0000 mg | Freq: Once | RESPIRATORY_TRACT | Status: DC

## 2015-02-23 MED ORDER — METHYLPREDNISOLONE SODIUM SUCC 125 MG IJ SOLR
125.0000 mg | Freq: Once | INTRAMUSCULAR | Status: AC
  Administered 2015-02-23: 125 mg via INTRAVENOUS
  Filled 2015-02-23: qty 2

## 2015-02-23 MED ORDER — SODIUM CHLORIDE 0.9 % IJ SOLN: 3.0000 mL | INTRAMUSCULAR | Status: DC | PRN

## 2015-02-23 MED ORDER — SODIUM CHLORIDE 0.9 % IV BOLUS (SEPSIS)
1000.0000 mL | Freq: Once | INTRAVENOUS | Status: AC
  Administered 2015-02-23: 1000 mL via INTRAVENOUS

## 2015-02-23 MED ORDER — ENOXAPARIN SODIUM 40 MG/0.4ML ~~LOC~~ SOLN: 40.0000 mg | SUBCUTANEOUS | Status: DC

## 2015-02-23 MED ORDER — SODIUM CHLORIDE 0.45 % IV SOLN
INTRAVENOUS | Status: DC
  Administered 2015-02-23: 18:00:00 via INTRAVENOUS

## 2015-02-23 MED ORDER — MAGNESIUM SULFATE 2 GM/50ML IV SOLN
2.0000 g | Freq: Once | INTRAVENOUS | Status: AC
  Administered 2015-02-23: 2 g via INTRAVENOUS
  Filled 2015-02-23: qty 50

## 2015-02-23 MED ORDER — ONDANSETRON HCL 4 MG/2ML IJ SOLN: 4.0000 mg | Freq: Four times a day (QID) | INTRAMUSCULAR | Status: DC | PRN

## 2015-02-23 MED ORDER — SODIUM CHLORIDE 0.9 % IJ SOLN
3.0000 mL | Freq: Two times a day (BID) | INTRAMUSCULAR | Status: DC
  Administered 2015-02-23 – 2015-02-26 (×6): 3 mL via INTRAVENOUS

## 2015-02-23 MED ORDER — IPRATROPIUM-ALBUTEROL 0.5-2.5 (3) MG/3ML IN SOLN
3.0000 mL | RESPIRATORY_TRACT | Status: DC
Start: 2015-02-23 — End: 2015-02-27
  Administered 2015-02-23 – 2015-02-27 (×22): 3 mL via RESPIRATORY_TRACT
  Filled 2015-02-23 (×21): qty 3

## 2015-02-23 MED ORDER — SODIUM CHLORIDE 0.9 % IV SOLN: 250.0000 mL | INTRAVENOUS | Status: DC | PRN

## 2015-02-23 NOTE — ED Notes (Signed)
Pt here with asthma and resp distress; pt speaking only single words and wheezing noted

## 2015-02-23 NOTE — Progress Notes (Signed)
Patient admitted to room 5M10 at this time. Alert and in stable condition. Placed on O2 @ 2L/min nasal cannula.

## 2015-02-23 NOTE — H&P (Signed)
[facility] History and Physical  [name] [id number] DOB: 1945-04-14 DOA: 02/23/2015  Referring physician: Dr Regenia Skeeter - [facility] PCP: Kristine Garbe, MD   Chief Complaint: SOB and wheezing  HPI: [name] is a 70 y.o. female  Wheezing and shortness of breath. Started on 02/19/2015. Initially intermittent but now constant. Patient seen in the ED on 02/21/2015 and started on azithromycin, steroids, and given nebulizer treatments. Initially she improved but has deteriorated over the last 24 hours. Now only with minimal improvement with albuterol treatments. Should with intermittent phlegm production but denies fevers, chest pain, LOC, palpitations. Occasional runny nose and URI type symptoms.  She states she feels improved after significant treatments in the ED with Solu-Medrol, CAT.   Review of Systems:  Constitutional:  No weight loss, night sweats, HEENT:  No headaches, Difficulty swallowing,Tooth/dental problems,Sore throat, Cardio-vascular:  No chest pain, Orthopnea, PND, swelling in lower extremities, anasarca, dizziness, palpitations  GI:  No heartburn, indigestion, abdominal pain, nausea, vomiting, diarrhea, change in bowel habits, loss of appetite  Resp: Per HPI Skin:  no rash or lesions.  GU:  no dysuria, change in color of urine, no urgency or frequency. No flank pain.  Musculoskeletal:   No joint pain or swelling. No decreased range of motion. No back pain.  Psych:  No change in mood or affect. No depression or anxiety. No memory loss.  Neuro:  No change in sensation, unilateral strength, or cognitive abilities  All other systems were reviewed and are negative.  Past Medical History  Diagnosis Date  . Asthma   . GERD (gastroesophageal reflux disease)   . Heart murmur    Past Surgical History  Procedure Laterality Date  . Tonsillectomy    . Tubal ligation    . Cataract extraction w/ intraocular lens  implant, bilateral  2011 &2009   Social  History:  reports that she has never smoked. She has never used smokeless tobacco. She reports that she does not drink alcohol or use illicit drugs.  Allergies  Allergen Reactions  . Aspirin Shortness Of Breath    Family History  Problem Relation Age of Onset  . Asthma Sister   . Emphysema Father     was a smoker  . Hypertension Mother   . Hypertension Sister      Prior to Admission medications   Medication Sig Start Date End Date Taking? Authorizing Provider  albuterol (PROVENTIL HFA;VENTOLIN HFA) 108 (90 BASE) MCG/ACT inhaler Inhale 2 puffs into the lungs every 6 (six) hours as needed for wheezing. 05/20/13  Yes Tanda Rockers, MD  albuterol (PROVENTIL) (2.5 MG/3ML) 0.083% nebulizer solution Take 3 mLs (2.5 mg total) by nebulization every 6 (six) hours as needed. asthma 04/05/12  Yes Bonnielee Haff, MD  azithromycin (ZITHROMAX) 250 MG tablet Take 1 tablet (250 mg total) by mouth daily. Take first 2 tablets together, then 1 every day until finished. 02/21/15  Yes Orpah Greek, MD  budesonide-formoterol (SYMBICORT) 160-4.5 MCG/ACT inhaler Inhale 2 puffs into the lungs 2 (two) times daily.   Yes Historical Provider, MD  predniSONE (DELTASONE) 20 MG tablet Take 3 tablets (60 mg total) by mouth daily with breakfast. 02/21/15  Yes Orpah Greek, MD  fluticasone (FLONASE) 50 MCG/ACT nasal spray Place 2 sprays into the nose daily. Patient not taking: Reported on 02/21/2015 11/28/11   Tanda Rockers, MD  HYDROcodone-acetaminophen (NORCO/VICODIN) 5-325 MG per tablet Take 1-2 tablets by mouth every 6 (six) hours as needed. Patient not taking: Reported on 02/21/2015  06/05/14   Heather Laisure, PA-C  pantoprazole (PROTONIX) 40 MG tablet Take 1 tablet (40 mg total) by mouth daily before breakfast. Patient not taking: Reported on 02/21/2015 11/28/11   Tanda Rockers, MD   Physical Exam: Filed Vitals:   02/23/15 1430 02/23/15 1445 02/23/15 1500 02/23/15 1515  BP: 146/77 130/82 125/77 128/69    Pulse: 109 104 110 106  Temp:      TempSrc:      Resp: 21 18 18 20   SpO2: 100% 98% 95% 98%    Wt Readings from Last 3 Encounters:  06/05/14 87.998 kg (194 lb)  05/20/13 92.534 kg (204 lb)  10/01/12 86.183 kg (190 lb)    General:  Appears calm and comfortable Eyes:  PERRL, EOMI, normal lids, iris ENT:  grossly normal hearing, lips & tongue Neck:  no LAD, masses or thyromegaly Cardiovascular:  RRR, no m/r/g. No LE edema.  Respiratory:  Increased work of breathing, on nasal cannula O2, diffuse wheezing throughout, decreased air movement throughout. Abdomen:  soft, ntnd Skin:  no rash or induration seen on limited exam Musculoskeletal:  grossly normal tone BUE/BLE Psychiatric:  grossly normal mood and affect, speech fluent and appropriate Neurologic:  CN 2-12 grossly intact, moves all extremities in coordinated fashion.          Labs on Admission:  Basic Metabolic Panel:  Recent Labs Lab 02/21/15 0429 02/23/15 1335  NA 138 143  K 3.9 4.1  CL 105 109  CO2 22 24  GLUCOSE 120* 135*  BUN 11 21*  CREATININE 0.95 1.09*  CALCIUM 9.0 9.0   Liver Function Tests: No results for input(s): AST, ALT, ALKPHOS, BILITOT, PROT, ALBUMIN in the last 168 hours. No results for input(s): LIPASE, AMYLASE in the last 168 hours. No results for input(s): AMMONIA in the last 168 hours. CBC:  Recent Labs Lab 02/21/15 0429 02/23/15 1335  WBC 6.4 6.1  NEUTROABS 4.1 5.2  HGB 12.0 16.8*  HCT 37.8 52.9*  MCV 78.4 79.1  PLT 221 115*   Cardiac Enzymes: No results for input(s): CKTOTAL, CKMB, CKMBINDEX, TROPONINI in the last 168 hours.  BNP (last 3 results) No results for input(s): BNP in the last 8760 hours.  ProBNP (last 3 results) No results for input(s): PROBNP in the last 8760 hours.   Creatinine clearance cannot be calculated (Unknown ideal weight.)  CBG: No results for input(s): GLUCAP in the last 168 hours.  Radiological Exams on Admission: Dg Chest Port 1  View  02/23/2015  CLINICAL DATA:  Shortness of breath, cough, and congestion. EXAM: PORTABLE CHEST 1 VIEW COMPARISON:  02/21/2015 FINDINGS: The cardiomediastinal silhouette is within normal limits. Thoracic aortic calcification is noted. The lungs are normally to mildly hyperinflated with slight chronic interstitial coarsening. No confluent airspace opacity, edema, pleural effusion, or pneumothorax is identified. No acute osseous abnormality is seen. IMPRESSION: No active disease. Electronically Signed   By: Logan Bores M.D.   On: 02/23/2015 13:24     Assessment/Plan Principal Problem:   Asthma exacerbation Active Problems:   Polycythemia   Hyperglycemia   Asthma exacerbation: Severe. Requiring magnesium, CAT, and solumedrol. Patient followed by Dr.Wert Melvyn Novas of pulmonology. Patient states she's had PFTs done that confirmed the diagnoses of asthma. No history of COPD or other lung pathology. - MedSurg, observation - Solu-Medrol 60 mg every 6 hours - Duo nebs every 4 hours, every 2 hours when necessary - Mucinex - O2 when necessary - Nasal Atrovent - DC azithromycin  Hyperglycemia: likely secondary  to acute stress adn steroids - A1c  Polycythemia: Hgb/Hct 16.8/52.9. Hemoconcentration vs respiratory driven hypoxia - IVF - CBC in am   Code Status: FULL  DVT Prophylaxis: Lovenox Family Communication: none Disposition Plan: Pending Improvement    Rohen Kimes Lenna Sciara, MD Family Medicine [facility] [email or web address] Password [email or web address]

## 2015-02-23 NOTE — ED Provider Notes (Signed)
CSN: [id number]     Arrival date & time 02/23/15  1240 History   First MD Initiated Contact with Patient 02/23/15 1303     Chief Complaint  Patient presents with  . Asthma     (Consider location/radiation/quality/duration/timing/severity/associated sxs/prior Treatment) HPI 70 year old female presents with worsening shortness of breath. Has a history of asthma and states she has been having productive cough with yellow sputum with subjective fever and chills for the last 4 days. Has been using albuterol with some temporary relief. Patient denies any chest pain unless sometime she feels tightness. No leg swelling or leg pain. No nasal congestion but she feels like she has a lot of chest congestion. She was seen in the ED on 1/1 in the middle of the night and given albuterol and steroids. She has been taking this but still worsening.  Past Medical History  Diagnosis Date  . Asthma   . GERD (gastroesophageal reflux disease)   . Heart murmur    Past Surgical History  Procedure Laterality Date  . Tonsillectomy    . Tubal ligation    . Cataract extraction w/ intraocular lens  implant, bilateral  2011 &2009   Family History  Problem Relation Age of Onset  . Asthma Sister   . Emphysema Father     was a smoker  . Hypertension Mother   . Hypertension Sister    Social History  Substance Use Topics  . Smoking status: Never Smoker   . Smokeless tobacco: Never Used  . Alcohol Use: No   OB History    No data available     Review of Systems  Constitutional: Positive for fever and chills.  HENT: Positive for congestion.   Respiratory: Positive for cough, chest tightness and shortness of breath.   Cardiovascular: Negative for chest pain and leg swelling.  Neurological: Positive for headaches (when coughing).  All other systems reviewed and are negative.     Allergies  Aspirin  Home Medications   Prior to Admission medications   Medication Sig Start Date End Date Taking?  Authorizing Provider  albuterol (PROVENTIL HFA;VENTOLIN HFA) 108 (90 BASE) MCG/ACT inhaler Inhale 2 puffs into the lungs every 6 (six) hours as needed for wheezing. 05/20/13   Tanda Rockers, MD  albuterol (PROVENTIL) (2.5 MG/3ML) 0.083% nebulizer solution Take 3 mLs (2.5 mg total) by nebulization every 6 (six) hours as needed. asthma 04/05/12   Bonnielee Haff, MD  azithromycin (ZITHROMAX) 250 MG tablet Take 1 tablet (250 mg total) by mouth daily. Take first 2 tablets together, then 1 every day until finished. 02/21/15   Orpah Greek, MD  budesonide-formoterol (SYMBICORT) 160-4.5 MCG/ACT inhaler Inhale 2 puffs into the lungs 2 (two) times daily.    Historical Provider, MD  fluticasone (FLONASE) 50 MCG/ACT nasal spray Place 2 sprays into the nose daily. Patient not taking: Reported on 02/21/2015 11/28/11   Tanda Rockers, MD  HYDROcodone-acetaminophen (NORCO/VICODIN) 5-325 MG per tablet Take 1-2 tablets by mouth every 6 (six) hours as needed. Patient not taking: Reported on 02/21/2015 06/05/14   Hyman Bible, PA-C  pantoprazole (PROTONIX) 40 MG tablet Take 1 tablet (40 mg total) by mouth daily before breakfast. Patient not taking: Reported on 02/21/2015 11/28/11   Tanda Rockers, MD  predniSONE (DELTASONE) 20 MG tablet Take 3 tablets (60 mg total) by mouth daily with breakfast. 02/21/15   Orpah Greek, MD   BP 138/84 mmHg  Pulse 101  Temp(Src) 98.2 F (36.8 C) (Oral)  Resp  21  SpO2 100% Physical Exam  Constitutional: She is oriented to person, place, and time. She appears well-developed and well-nourished.  HENT:  Head: Normocephalic and atraumatic.  Right Ear: External ear normal.  Left Ear: External ear normal.  Nose: Nose normal.  Eyes: Right eye exhibits no discharge. Left eye exhibits no discharge.  Cardiovascular: Regular rhythm and normal heart sounds.  Tachycardia present.   Pulmonary/Chest: Tachypnea noted. She has wheezes. She has rhonchi.  Abdominal: She exhibits no  distension.  Musculoskeletal: She exhibits no edema.  Neurological: She is alert and oriented to person, place, and time.  Skin: Skin is warm and dry. She is not diaphoretic.  Nursing note and vitals reviewed.   ED Course  Procedures (including critical care time) Labs Review Labs Reviewed  BASIC METABOLIC PANEL - Abnormal; Notable for the following:    Glucose, Bld 135 (*)    BUN 21 (*)    Creatinine, Ser 1.09 (*)    GFR calc non Af Amer 50 (*)    GFR calc Af Amer 58 (*)    All other components within normal limits  CBC WITH DIFFERENTIAL/PLATELET - Abnormal; Notable for the following:    RBC 6.69 (*)    Hemoglobin 16.8 (*)    HCT 52.9 (*)    MCH 25.1 (*)    RDW 16.4 (*)    Platelets 115 (*)    Lymphs Abs 0.6 (*)    All other components within normal limits  HEMOGLOBIN A1C    Imaging Review Dg Chest Port 1 View  02/23/2015  CLINICAL DATA:  Shortness of breath, cough, and congestion. EXAM: PORTABLE CHEST 1 VIEW COMPARISON:  02/21/2015 FINDINGS: The cardiomediastinal silhouette is within normal limits. Thoracic aortic calcification is noted. The lungs are normally to mildly hyperinflated with slight chronic interstitial coarsening. No confluent airspace opacity, edema, pleural effusion, or pneumothorax is identified. No acute osseous abnormality is seen. IMPRESSION: No active disease. Electronically Signed   By: Logan Bores M.D.   On: 02/23/2015 13:24   I have personally reviewed and evaluated these images and lab results as part of my medical decision-making.   EKG Interpretation None      MDM   Final diagnoses:  Asthma exacerbation    Patient presents with a significant asthma exacerbation. Her work of breathing has improved but she is still very wheezy and still mildly tachypnea. Given that she is like this after a continuous albuterol treatment, I feel she needs further treatment in the hospital with observation and respiratory support. No significant smoking history  or history of COPD, and without PNA I don't think antibiotics are needed at this time.    Sherwood Gambler, MD 02/23/15 [phone]

## 2015-02-24 DIAGNOSIS — Z886 Allergy status to analgesic agent status: Secondary | ICD-10-CM | POA: Diagnosis not present

## 2015-02-24 DIAGNOSIS — J45901 Unspecified asthma with (acute) exacerbation: Secondary | ICD-10-CM | POA: Diagnosis present

## 2015-02-24 DIAGNOSIS — K219 Gastro-esophageal reflux disease without esophagitis: Secondary | ICD-10-CM | POA: Diagnosis present

## 2015-02-24 DIAGNOSIS — T380X5A Adverse effect of glucocorticoids and synthetic analogues, initial encounter: Secondary | ICD-10-CM | POA: Diagnosis present

## 2015-02-24 DIAGNOSIS — D751 Secondary polycythemia: Secondary | ICD-10-CM | POA: Diagnosis present

## 2015-02-24 DIAGNOSIS — R739 Hyperglycemia, unspecified: Secondary | ICD-10-CM | POA: Diagnosis present

## 2015-02-24 DIAGNOSIS — Z79891 Long term (current) use of opiate analgesic: Secondary | ICD-10-CM | POA: Diagnosis not present

## 2015-02-24 DIAGNOSIS — Z79899 Other long term (current) drug therapy: Secondary | ICD-10-CM | POA: Diagnosis not present

## 2015-02-24 LAB — CBC WITH DIFFERENTIAL/PLATELET
BASOS PCT: 2 %
Basophils Absolute: 0.2 10*3/uL — ABNORMAL HIGH (ref 0.0–0.1)
EOS PCT: 0 %
Eosinophils Absolute: 0 10*3/uL (ref 0.0–0.7)
HCT: 33.2 % — ABNORMAL LOW (ref 36.0–46.0)
HEMOGLOBIN: 10.6 g/dL — AB (ref 12.0–15.0)
LYMPHS PCT: 27 %
Lymphs Abs: 2.1 10*3/uL (ref 0.7–4.0)
MCH: 24.9 pg — AB (ref 26.0–34.0)
MCHC: 31.9 g/dL (ref 30.0–36.0)
MCV: 78.1 fL (ref 78.0–100.0)
Monocytes Absolute: 0.2 10*3/uL (ref 0.1–1.0)
Monocytes Relative: 3 %
NEUTROS PCT: 68 %
Neutro Abs: 5.4 10*3/uL (ref 1.7–7.7)
Platelets: 232 10*3/uL (ref 150–400)
RBC: 4.25 MIL/uL (ref 3.87–5.11)
RDW: 16.3 % — ABNORMAL HIGH (ref 11.5–15.5)
WBC: 7.9 10*3/uL (ref 4.0–10.5)

## 2015-02-24 LAB — COMPREHENSIVE METABOLIC PANEL
ALBUMIN: 3.2 g/dL — AB (ref 3.5–5.0)
ALK PHOS: 100 U/L (ref 38–126)
ALT: 19 U/L (ref 14–54)
ANION GAP: 8 (ref 5–15)
AST: 20 U/L (ref 15–41)
BILIRUBIN TOTAL: 0.5 mg/dL (ref 0.3–1.2)
BUN: 15 mg/dL (ref 6–20)
CO2: 27 mmol/L (ref 22–32)
Calcium: 9.1 mg/dL (ref 8.9–10.3)
Chloride: 107 mmol/L (ref 101–111)
Creatinine, Ser: 0.75 mg/dL (ref 0.44–1.00)
GFR calc Af Amer: >60 mL/min (ref >60)
Glucose, Bld: 125 mg/dL — ABNORMAL HIGH (ref 65–99)
Potassium: 5 mmol/L (ref 3.5–5.1)
Sodium: 142 mmol/L (ref 135–145)
Total Protein: 6.3 g/dL — ABNORMAL LOW (ref 6.5–8.1)

## 2015-02-24 LAB — MAGNESIUM: Magnesium: 2.7 mg/dL — ABNORMAL HIGH (ref 1.7–2.4)

## 2015-02-24 LAB — HEMOGLOBIN A1C
HEMOGLOBIN A1C: 5.8 % — AB (ref 4.8–5.6)
MEAN PLASMA GLUCOSE: 120 mg/dL

## 2015-02-24 LAB — PHOSPHORUS: Phosphorus: 4 mg/dL (ref 2.5–4.6)

## 2015-02-24 MED ORDER — ENOXAPARIN SODIUM 40 MG/0.4ML ~~LOC~~ SOLN
40.0000 mg | SUBCUTANEOUS | Status: DC
  Administered 2015-02-24 – 2015-02-26 (×3): 40 mg via SUBCUTANEOUS
  Filled 2015-02-24 (×3): qty 0.4

## 2015-02-24 MED ORDER — LORATADINE 10 MG PO TABS
10.0000 mg | ORAL_TABLET | Freq: Every day | ORAL | Status: DC
  Administered 2015-02-24 – 2015-02-27 (×4): 10 mg via ORAL
  Filled 2015-02-24 (×4): qty 1

## 2015-02-24 MED ORDER — PANTOPRAZOLE SODIUM 40 MG PO TBEC
40.0000 mg | DELAYED_RELEASE_TABLET | Freq: Every day | ORAL | Status: DC
  Administered 2015-02-24 – 2015-02-26 (×3): 40 mg via ORAL
  Filled 2015-02-24 (×2): qty 1

## 2015-02-24 MED ORDER — GUAIFENESIN ER 600 MG PO TB12
600.0000 mg | ORAL_TABLET | Freq: Two times a day (BID) | ORAL | Status: DC
  Administered 2015-02-24 – 2015-02-27 (×6): 600 mg via ORAL
  Filled 2015-02-24 (×7): qty 1

## 2015-02-24 MED ORDER — FLUTICASONE PROPIONATE 50 MCG/ACT NA SUSP
2.0000 | Freq: Every day | NASAL | Status: DC
  Administered 2015-02-25 – 2015-02-27 (×3): 2 via NASAL
  Filled 2015-02-24: qty 16

## 2015-02-24 MED ORDER — METHYLPREDNISOLONE SODIUM SUCC 125 MG IJ SOLR
60.0000 mg | Freq: Three times a day (TID) | INTRAMUSCULAR | Status: DC
  Administered 2015-02-24 – 2015-02-25 (×5): 60 mg via INTRAVENOUS
  Filled 2015-02-24 (×5): qty 2

## 2015-02-24 MED ORDER — BUDESONIDE 0.25 MG/2ML IN SUSP
0.2500 mg | Freq: Two times a day (BID) | RESPIRATORY_TRACT | Status: DC
  Administered 2015-02-24 – 2015-02-27 (×7): 0.25 mg via RESPIRATORY_TRACT
  Filled 2015-02-24 (×7): qty 2

## 2015-02-24 NOTE — Care Management Note (Addendum)
Case Management Note  Patient Details  Name: [name] MRN: [id number] Date of Birth: 1945/09/28  Subjective/Objective:  Patient admitted with asthma exacerbation. Pt from home with family.                   Action/Plan: CM will continue to follow for discharge needs.   Expected Discharge Date:   (Pending)               Expected Discharge Plan:     In-House Referral:     Discharge planning Services     Post Acute Care Choice:    Choice offered to:     DME Arranged:    DME Agency:     HH Arranged:    HH Agency:     Status of Service:  In process, will continue to follow  Medicare Important Message Given:    Date Medicare IM Given:    Medicare IM give by:    Date Additional Medicare IM Given:    Additional Medicare Important Message give by:     If discussed at [place] of Stay Meetings, dates discussed:    Additional Comments:  Pollie Friar, RN 02/24/2015, 3:15 PM

## 2015-02-24 NOTE — Progress Notes (Signed)
PATIENT DETAILS Name: [name] Age: 70 y.o. Sex: female Date of Birth: [birth year] Admit Date: 02/23/2015 Admitting Physician Waldemar Dickens, MD [id number], MD  Subjective: Feels better-less SOB  Assessment/Plan: Principal Problem: Asthma exacerbation: Improved compared to admission-however still wheezing. Not in any distress, appears very comfortable. Continue Solu-Medrol, scheduled bronchodilators and incentive spirometry. Add Mucinex. Suspect that if clinical improvement continues-patient should be able to go home the next 1-2 days.  Active Problems:   Polycythemia: Suspect hemoglobin on admission was either a lab error oh from hemoconcentration. Hemoglobin down to 10.6 today. Further workup required while inpatient     Hyperglycemia: Mild hyperglycemia-however A1c only at 5.8. Follow.    GERD: Continue PPI  Disposition: Remain inpatient-home in 1-2 days  Antimicrobial agents  See below  Anti-infectives    None      DVT Prophylaxis: Prophylactic Lovenox   Code Status: Full code   Family Communication None at bedside  Procedures: None  CONSULTS:  None  Time spent 30 minutes-Greater than 50% of this time was spent in counseling, explanation of diagnosis, planning of further management, and coordination of care.  MEDICATIONS: Scheduled Meds: . budesonide (PULMICORT) nebulizer solution  0.25 mg Nebulization BID  . ipratropium-albuterol  3 mL Nebulization Q4H  . loratadine  10 mg Oral Daily  . methylPREDNISolone (SOLU-MEDROL) injection  60 mg Intravenous 3 times per day  . pantoprazole  40 mg Oral Q1200  . sodium chloride  3 mL Intravenous Q12H   Continuous Infusions:  PRN Meds:.sodium chloride, ondansetron **OR** ondansetron (ZOFRAN) IV, sodium chloride    PHYSICAL EXAM: Vital signs in last 24 hours: Filed Vitals:   02/24/15 0418 02/24/15 0556 02/24/15 0942 02/24/15 1243  BP:  149/84 135/74   Pulse: 79 90 87   Temp:   97.9 F (36.6 C) 98.1 F (36.7 C)   TempSrc:  Oral Oral   Resp: 20 18 17    Height:      Weight:      SpO2: 97% 99% 98% 98%    Weight change:  Filed Weights   02/23/15 1730  Weight: 86.41 kg (190 lb 8 oz)   Body mass index is 27.33 kg/(m^2).   Gen Exam: Awake and alert with clear speech.   Neck: Supple, No JVD.   Chest: Good air entry bilaterally-coarse rhonchi all over CVS: S1 S2 Regular, no murmurs.  Abdomen: soft, BS +, non tender, non distended.  Extremities: no edema, lower extremities warm to touch. Neurologic: Non Focal.  Skin: No Rash.   Wounds: N/A.    Intake/Output from previous day:  Intake/Output Summary (Last 24 hours) at 02/24/15 1534 Last data filed at 02/24/15 0941  Gross per 24 hour  Intake   1120 ml  Output      0 ml  Net   1120 ml     LAB RESULTS: CBC  Recent Labs Lab 02/21/15 0429 02/23/15 1335 02/24/15 0552  WBC 6.4 6.1 7.9  HGB 12.0 16.8* 10.6*  HCT 37.8 52.9* 33.2*  PLT 221 115* 232  MCV 78.4 79.1 78.1  MCH 24.9* 25.1* 24.9*  MCHC 31.7 31.8 31.9  RDW 16.1* 16.4* 16.3*  LYMPHSABS 1.4 0.6* 2.1  MONOABS 0.6 0.2 0.2  EOSABS 0.2 0.0 0.0  BASOSABS 0.1 0.1 0.2*    Chemistries   Recent Labs Lab 02/21/15 0429 02/23/15 1335 02/24/15 0552  NA 138 143 142  K 3.9 4.1 5.0  CL 105 109 107  CO2 22 24 27   GLUCOSE 120* 135* 125*  BUN 11 21* 15  CREATININE 0.95 1.09* 0.75  CALCIUM 9.0 9.0 9.1  MG  --   --  2.7*    CBG: No results for input(s): GLUCAP in the last 168 hours.  GFR Estimated Creatinine Clearance: 77.1 mL/min (by C-G formula based on Cr of 0.75).  Coagulation profile No results for input(s): INR, PROTIME in the last 168 hours.  Cardiac Enzymes No results for input(s): CKMB, TROPONINI, MYOGLOBIN in the last 168 hours.  Invalid input(s): CK  Invalid input(s): POCBNP No results for input(s): DDIMER in the last 72 hours.  Recent Labs  02/23/15 1335  HGBA1C 5.8*   No results for input(s): CHOL, HDL, LDLCALC,  TRIG, CHOLHDL, LDLDIRECT in the last 72 hours. No results for input(s): TSH, T4TOTAL, T3FREE, THYROIDAB in the last 72 hours.  Invalid input(s): FREET3 No results for input(s): VITAMINB12, FOLATE, FERRITIN, TIBC, IRON, RETICCTPCT in the last 72 hours. No results for input(s): LIPASE, AMYLASE in the last 72 hours.  Urine Studies No results for input(s): UHGB, CRYS in the last 72 hours.  Invalid input(s): UACOL, UAPR, USPG, UPH, UTP, UGL, UKET, UBIL, UNIT, UROB, ULEU, UEPI, UWBC, URBC, UBAC, CAST, UCOM, BILUA  MICROBIOLOGY: No results found for this or any previous visit (from the past 240 hour(s)).  RADIOLOGY STUDIES/RESULTS: Dg Chest Port 1 View  02/23/2015  CLINICAL DATA:  Shortness of breath, cough, and congestion. EXAM: PORTABLE CHEST 1 VIEW COMPARISON:  02/21/2015 FINDINGS: The cardiomediastinal silhouette is within normal limits. Thoracic aortic calcification is noted. The lungs are normally to mildly hyperinflated with slight chronic interstitial coarsening. No confluent airspace opacity, edema, pleural effusion, or pneumothorax is identified. No acute osseous abnormality is seen. IMPRESSION: No active disease. Electronically Signed   By: Logan Bores M.D.   On: 02/23/2015 13:24   Dg Chest Port 1 View  02/21/2015  CLINICAL DATA:  Acute onset of shortness of breath. Initial encounter. EXAM: PORTABLE CHEST 1 VIEW COMPARISON:  Chest radiograph performed 03/31/2012 FINDINGS: The lungs are hyperexpanded, with flattening of the hemidiaphragms, reflecting mild COPD. Mild bibasilar atelectasis is noted. There is no evidence of pleural effusion or pneumothorax. The cardiomediastinal silhouette is within normal limits. No acute osseous abnormalities are seen. IMPRESSION: Findings of COPD.  Mild bibasilar atelectasis noted. Electronically Signed   By: Garald Balding M.D.   On: 02/21/2015 04:53    Oren Binet, MD  [facility] Pager:[phone]  If 7PM-7AM, please contact  night-coverage [email or web address] Password [facility] 02/24/2015, 3:34 PM

## 2015-02-25 MED ORDER — METHYLPREDNISOLONE SODIUM SUCC 40 MG IJ SOLR
40.0000 mg | Freq: Two times a day (BID) | INTRAMUSCULAR | Status: DC
  Administered 2015-02-26 – 2015-02-27 (×3): 40 mg via INTRAVENOUS
  Filled 2015-02-25 (×3): qty 1

## 2015-02-25 NOTE — Progress Notes (Signed)
PATIENT DETAILS Name: [name] Age: 70 y.o. Sex: female Date of Birth: [birth year] Admit Date: 02/23/2015 Admitting Physician Waldemar Dickens, MD [id number] D, MD  Subjective: Feels almost back to baseline-still wheezing  Assessment/Plan: Principal Problem: Asthma exacerbation: Continues to improve but still wheezing. Breathing comfortably, not in any distress. Continue Solu-Medrol-but taper down, scheduled bronchodilators, Mucinex and incentive spirometry. O2 sats acceptable on 2L [place], trial off O2 prior to discharge. Possible discharge home tomorrow if wheezing improved.   Active Problems:   Polycythemia: Repeat values within normal limits, suspect hemoglobin on admission was either a lab error or from hemoconcentration. No further workup required while inpatient    Hyperglycemia: Mild hyperglycemia- A1c at 5.8. Monitor.    GERD: No complaints, Continue protonix  Disposition: Remains inpatient for IV steroids - possible discharge home tomorrow.  Antimicrobial agents  See below  Anti-infectives    None      DVT Prophylaxis: Prophylactic Lovenox   Code Status: Full code   Family Communication None at bedside  Procedures: None  CONSULTS:  None   MEDICATIONS: Scheduled Meds: . budesonide (PULMICORT) nebulizer solution  0.25 mg Nebulization BID  . enoxaparin (LOVENOX) injection  40 mg Subcutaneous Q24H  . fluticasone  2 spray Each Nare Daily  . guaiFENesin  600 mg Oral BID  . ipratropium-albuterol  3 mL Nebulization Q4H  . loratadine  10 mg Oral Daily  . methylPREDNISolone (SOLU-MEDROL) injection  60 mg Intravenous 3 times per day  . pantoprazole  40 mg Oral Q1200  . sodium chloride  3 mL Intravenous Q12H   Continuous Infusions:  PRN Meds:.sodium chloride, ondansetron **OR** ondansetron (ZOFRAN) IV, sodium chloride    PHYSICAL EXAM: Vital signs in last 24 hours: Filed Vitals:   02/25/15 0534 02/25/15 0924 02/25/15 1003  02/25/15 1156  BP: 134/68  147/76   Pulse: 91 95 95 78  Temp: 98.1 F (36.7 C)  98.2 F (36.8 C)   TempSrc: Oral  Oral   Resp: 18 18 18 18   Height:      Weight:      SpO2: 100% 97% 100% 99%    Weight change:  Filed Weights   02/23/15 1730  Weight: 86.41 kg (190 lb 8 oz)   Body mass index is 27.33 kg/(m^2).   Gen Exam: A&Ox3 with clear speech.  Sitting up in chair. Head: Normocephalic atraumatic Neck: Supple, No JVD.   Chest: Good air entry, notable wheezing diffusely. No conversational dyspnea CVS: S1 S2 Regular, no murmurs appreciated.  Abdomen:  Extremities: no edema Neurologic:   Skin: No Rash.   Wounds: N/A  Intake/Output from previous day: No intake or output data in the 24 hours ending 02/25/15 1251   LAB RESULTS: CBC  Recent Labs Lab 02/21/15 0429 02/23/15 1335 02/24/15 0552  WBC 6.4 6.1 7.9  HGB 12.0 16.8* 10.6*  HCT 37.8 52.9* 33.2*  PLT 221 115* 232  MCV 78.4 79.1 78.1  MCH 24.9* 25.1* 24.9*  MCHC 31.7 31.8 31.9  RDW 16.1* 16.4* 16.3*  LYMPHSABS 1.4 0.6* 2.1  MONOABS 0.6 0.2 0.2  EOSABS 0.2 0.0 0.0  BASOSABS 0.1 0.1 0.2*    Chemistries   Recent Labs Lab 02/21/15 0429 02/23/15 1335 02/24/15 0552  NA 138 143 142  K 3.9 4.1 5.0  CL 105 109 107  CO2 22 24 27   GLUCOSE 120* 135* 125*  BUN 11 21* 15  CREATININE 0.95  1.09* 0.75  CALCIUM 9.0 9.0 9.1  MG  --   --  2.7*    CBG: No results for input(s): GLUCAP in the last 168 hours.  GFR Estimated Creatinine Clearance: 77.1 mL/min (by C-G formula based on Cr of 0.75).  Coagulation profile No results for input(s): INR, PROTIME in the last 168 hours.  Cardiac Enzymes No results for input(s): CKMB, TROPONINI, MYOGLOBIN in the last 168 hours.  Invalid input(s): CK  Invalid input(s): POCBNP No results for input(s): DDIMER in the last 72 hours.  Recent Labs  02/23/15 1335  HGBA1C 5.8*   No results for input(s): CHOL, HDL, LDLCALC, TRIG, CHOLHDL, LDLDIRECT in the last 72  hours. No results for input(s): TSH, T4TOTAL, T3FREE, THYROIDAB in the last 72 hours.  Invalid input(s): FREET3 No results for input(s): VITAMINB12, FOLATE, FERRITIN, TIBC, IRON, RETICCTPCT in the last 72 hours. No results for input(s): LIPASE, AMYLASE in the last 72 hours.  Urine Studies No results for input(s): UHGB, CRYS in the last 72 hours.  Invalid input(s): UACOL, UAPR, USPG, UPH, UTP, UGL, UKET, UBIL, UNIT, UROB, ULEU, UEPI, UWBC, URBC, UBAC, CAST, UCOM, BILUA  MICROBIOLOGY: No results found for this or any previous visit (from the past 240 hour(s)).  RADIOLOGY STUDIES/RESULTS: Dg Chest Port 1 View  02/23/2015  CLINICAL DATA:  Shortness of breath, cough, and congestion. EXAM: PORTABLE CHEST 1 VIEW COMPARISON:  02/21/2015 FINDINGS: The cardiomediastinal silhouette is within normal limits. Thoracic aortic calcification is noted. The lungs are normally to mildly hyperinflated with slight chronic interstitial coarsening. No confluent airspace opacity, edema, pleural effusion, or pneumothorax is identified. No acute osseous abnormality is seen. IMPRESSION: No active disease. Electronically Signed   By: Logan Bores M.D.   On: 02/23/2015 13:24   Dg Chest Port 1 View  02/21/2015  CLINICAL DATA:  Acute onset of shortness of breath. Initial encounter. EXAM: PORTABLE CHEST 1 VIEW COMPARISON:  Chest radiograph performed 03/31/2012 FINDINGS: The lungs are hyperexpanded, with flattening of the hemidiaphragms, reflecting mild COPD. Mild bibasilar atelectasis is noted. There is no evidence of pleural effusion or pneumothorax. The cardiomediastinal silhouette is within normal limits. No acute osseous abnormalities are seen. IMPRESSION: Findings of COPD.  Mild bibasilar atelectasis noted. Electronically Signed   By: Garald Balding M.D.   On: 02/21/2015 04:53   Nena Alexander MD   [facility] Pager:[phone] If 7PM-7AM, please contact night-coverage [email or web address] Password [email or web address] 02/25/2015, 12:51  PM   LOS: 1 day

## 2015-02-26 NOTE — Care Management Important Message (Signed)
Important Message  Patient Details  Name: [name] MRN: [id number] Date of Birth: 1945/12/15   Medicare Important Message Given:  Yes    Victorino Fatzinger P Mehar Kirkwood 02/26/2015, 2:29 PM

## 2015-02-26 NOTE — Progress Notes (Signed)
PATIENT DETAILS Name: [name] Age: 70 y.o. Sex: female Date of Birth: [birth year] Admit Date: 02/23/2015 Admitting Physician Waldemar Dickens, MD [id number] D, MD  Subjective: Slowly improving-still wheezing-especially after ambulation-not yet back to her baseline.  Assessment/Plan: Principal Problem: Asthma exacerbation: Continues to improve but still wheezing. Breathing comfortably, not in any distress. Continue Solu-Medrol-but taper down, scheduled bronchodilators, Mucinex and incentive spirometry. O2 sats acceptable on 2L [place], trial off O2 prior to discharge. Possible discharge home tomorrow if wheezing improved.   Active Problems:   Polycythemia: Repeat values within normal limits, suspect hemoglobin on admission was either a lab error or from hemoconcentration. No further workup required while inpatient    Hyperglycemia: Mild hyperglycemia- A1c at 5.8. Monitor.    GERD: No complaints, Continue protonix  Disposition: Remains inpatient for IV steroids - possible discharge home in 1-2 days  Antimicrobial agents  See below  Anti-infectives    None      DVT Prophylaxis: Prophylactic Lovenox   Code Status: Full code   Family Communication None at bedside  Procedures: None  CONSULTS:  None   MEDICATIONS: Scheduled Meds: . budesonide (PULMICORT) nebulizer solution  0.25 mg Nebulization BID  . enoxaparin (LOVENOX) injection  40 mg Subcutaneous Q24H  . fluticasone  2 spray Each Nare Daily  . guaiFENesin  600 mg Oral BID  . ipratropium-albuterol  3 mL Nebulization Q4H  . loratadine  10 mg Oral Daily  . methylPREDNISolone (SOLU-MEDROL) injection  40 mg Intravenous Q12H  . pantoprazole  40 mg Oral Q1200  . sodium chloride  3 mL Intravenous Q12H   Continuous Infusions:  PRN Meds:.sodium chloride, ondansetron **OR** ondansetron (ZOFRAN) IV, sodium chloride    PHYSICAL EXAM: Vital signs in last 24 hours: Filed Vitals:   02/26/15 0724  02/26/15 1012 02/26/15 1236 02/26/15 1353  BP:  151/80  155/69  Pulse:  83  96  Temp:  98.3 F (36.8 C)  98 F (36.7 C)  TempSrc:  Oral  Oral  Resp:  20  20  Height:      Weight:      SpO2: 94% 96% 97% 99%    Weight change:  Filed Weights   02/23/15 1730  Weight: 86.41 kg (190 lb 8 oz)   Body mass index is 27.33 kg/(m^2).   Gen Exam: A&Ox3 with clear speech.  Sitting up in chair. Head: Normocephalic atraumatic Neck: Supple, No JVD.   Chest: Good air entry, notable wheezing diffusely. No conversational dyspnea CVS: S1 S2 Regular, no murmurs appreciated.  Abdomen:  Extremities: no edema Neurologic:   Skin: No Rash.   Wounds: N/A  Intake/Output from previous day:  Intake/Output Summary (Last 24 hours) at 02/26/15 1427 Last data filed at 02/26/15 0700  Gross per 24 hour  Intake    240 ml  Output      0 ml  Net    240 ml     LAB RESULTS: CBC  Recent Labs Lab 02/21/15 0429 02/23/15 1335 02/24/15 0552  WBC 6.4 6.1 7.9  HGB 12.0 16.8* 10.6*  HCT 37.8 52.9* 33.2*  PLT 221 115* 232  MCV 78.4 79.1 78.1  MCH 24.9* 25.1* 24.9*  MCHC 31.7 31.8 31.9  RDW 16.1* 16.4* 16.3*  LYMPHSABS 1.4 0.6* 2.1  MONOABS 0.6 0.2 0.2  EOSABS 0.2 0.0 0.0  BASOSABS 0.1 0.1 0.2*    Chemistries   Recent Labs Lab 02/21/15 0429 02/23/15  1335 02/24/15 0552  NA 138 143 142  K 3.9 4.1 5.0  CL 105 109 107  CO2 22 24 27   GLUCOSE 120* 135* 125*  BUN 11 21* 15  CREATININE 0.95 1.09* 0.75  CALCIUM 9.0 9.0 9.1  MG  --   --  2.7*    CBG: No results for input(s): GLUCAP in the last 168 hours.  GFR Estimated Creatinine Clearance: 77.1 mL/min (by C-G formula based on Cr of 0.75).  Coagulation profile No results for input(s): INR, PROTIME in the last 168 hours.  Cardiac Enzymes No results for input(s): CKMB, TROPONINI, MYOGLOBIN in the last 168 hours.  Invalid input(s): CK  Invalid input(s): POCBNP No results for input(s): DDIMER in the last 72 hours. No results for  input(s): HGBA1C in the last 72 hours. No results for input(s): CHOL, HDL, LDLCALC, TRIG, CHOLHDL, LDLDIRECT in the last 72 hours. No results for input(s): TSH, T4TOTAL, T3FREE, THYROIDAB in the last 72 hours.  Invalid input(s): FREET3 No results for input(s): VITAMINB12, FOLATE, FERRITIN, TIBC, IRON, RETICCTPCT in the last 72 hours. No results for input(s): LIPASE, AMYLASE in the last 72 hours.  Urine Studies No results for input(s): UHGB, CRYS in the last 72 hours.  Invalid input(s): UACOL, UAPR, USPG, UPH, UTP, UGL, UKET, UBIL, UNIT, UROB, ULEU, UEPI, UWBC, URBC, UBAC, CAST, UCOM, BILUA  MICROBIOLOGY: No results found for this or any previous visit (from the past 240 hour(s)).  RADIOLOGY STUDIES/RESULTS: Dg Chest Port 1 View  02/23/2015  CLINICAL DATA:  Shortness of breath, cough, and congestion. EXAM: PORTABLE CHEST 1 VIEW COMPARISON:  02/21/2015 FINDINGS: The cardiomediastinal silhouette is within normal limits. Thoracic aortic calcification is noted. The lungs are normally to mildly hyperinflated with slight chronic interstitial coarsening. No confluent airspace opacity, edema, pleural effusion, or pneumothorax is identified. No acute osseous abnormality is seen. IMPRESSION: No active disease. Electronically Signed   By: Logan Bores M.D.   On: 02/23/2015 13:24   Dg Chest Port 1 View  02/21/2015  CLINICAL DATA:  Acute onset of shortness of breath. Initial encounter. EXAM: PORTABLE CHEST 1 VIEW COMPARISON:  Chest radiograph performed 03/31/2012 FINDINGS: The lungs are hyperexpanded, with flattening of the hemidiaphragms, reflecting mild COPD. Mild bibasilar atelectasis is noted. There is no evidence of pleural effusion or pneumothorax. The cardiomediastinal silhouette is within normal limits. No acute osseous abnormalities are seen. IMPRESSION: Findings of COPD.  Mild bibasilar atelectasis noted. Electronically Signed   By: Garald Balding M.D.   On: 02/21/2015 04:53   Nena Alexander MD   [facility] Pager:[phone] If 7PM-7AM, please contact night-coverage [email or web address] Password [email or web address] 02/26/2015, 2:27 PM   LOS: 2 days

## 2015-02-26 NOTE — Consult Note (Signed)
   [facility] CM Inpatient Consult   02/26/2015  [name] [birth year] [phone] Met with the patient regarding [facility] services as a benefit of her Medicare plan.  Patient endorses that she has changed her primary care provider recently to Dr. Lin Landsman.  Explained to the patient that Dr. Ayesha Rumpf is currently not in the [facility] network.  Patient endorses that she currently does not feel that she has any post hospital care management needs but she states she would like information regarding [facility] services to share with her primary care provider.  No further care management needs were identified.  Patient currently declined services.  Encouraged patient to follow up with Medicare regarding her new primary care provider.  For questions, please contact: Natividad Brood, RN BSN [facility] Liaison  [phone] business mobile phone

## 2015-02-27 DIAGNOSIS — J45901 Unspecified asthma with (acute) exacerbation: Principal | ICD-10-CM

## 2015-02-27 MED ORDER — PREDNISONE 5 MG PO TABS: ORAL_TABLET | ORAL | Status: DC

## 2015-02-27 MED ORDER — MONTELUKAST SODIUM 10 MG PO TABS
10.0000 mg | ORAL_TABLET | Freq: Every day | ORAL | Status: DC
Start: 2015-02-27 — End: 2016-04-10

## 2015-02-27 MED ORDER — ALBUTEROL SULFATE HFA 108 (90 BASE) MCG/ACT IN AERS: 2.0000 | INHALATION_SPRAY | Freq: Four times a day (QID) | RESPIRATORY_TRACT | Status: DC | PRN

## 2015-02-27 NOTE — Progress Notes (Signed)
Patient discharged home but unable to find a ride home due to weather.Dr Candiss Norse informed.Case management called and unable to find find a solution at this time. Patient encouraged to find transportation home

## 2015-02-27 NOTE — Discharge Summary (Signed)
[name], is a 70 y.o. female  DOB [birth year]  MRN [id number].  Admission date:  02/23/2015  Admitting Physician  Waldemar Dickens, MD  Discharge Date:  02/27/2015   Primary MD  Kristine Garbe, MD  Recommendations for primary care physician for things to follow:   CBC, BMP and a 2 view chest x-ray in one week. One-time outpatient pulmonary follow-up for asthma.   Admission Diagnosis  Asthma exacerbation [J45.901]   Discharge Diagnosis  Asthma exacerbation [J45.901]     Principal Problem:   Asthma exacerbation Active Problems:   Polycythemia   Hyperglycemia      Past Medical History  Diagnosis Date  . Asthma   . GERD (gastroesophageal reflux disease)   . Heart murmur     Past Surgical History  Procedure Laterality Date  . Tonsillectomy    . Tubal ligation    . Cataract extraction w/ intraocular lens  implant, bilateral  2011 &2009       HPI  from the history and physical done on the day of admission:    [name] is a 70 y.o. female  Wheezing and shortness of breath. Started on 02/19/2015. Initially intermittent but now constant. Patient seen in the ED on 02/21/2015 and started on azithromycin, steroids, and given nebulizer treatments. Initially she improved but has deteriorated over the last 24 hours. Now only with minimal improvement with albuterol treatments. Should with intermittent phlegm production but denies fevers, chest pain, LOC, palpitations. Occasional runny nose and URI type symptoms.  She states she feels improved after significant treatments in the ED with Solu-Medrol, CAT.     Hospital Course:     Asthma exacerbation: Much improved, minimal wheezing, no shortness of breath or oxygen demand, was treated with IV steroids along with breathing treatments and oxygen initially.  We will place her on oral steroid taper, add Singulair, continue home nebulizer treatments and hand-held inhaler as needed per home regimen. We'll have her follow with PCP in a week along with one-time outpatient pulmonary follow-up.    Polycythemia: Repeat values within normal limits, suspect hemoglobin on admission was either a lab error or from hemoconcentration. No further workup required while inpatient, CBC in a week by PCP   Hyperglycemia: Mild hyperglycemia steroid-related, A1c was 5.8.  GERD: No complaints, Continue protonix     Discharge Condition: Stable  Follow UP  Follow-up Information    Follow up with REESE,BETTI D, MD. Schedule an appointment as soon as possible for a visit in 1 week.   Specialty:  Family Medicine   Contact information:   [id number] [address] [phone]       Follow up with [facility], MD. Schedule an appointment as soon as possible for a visit in 1 week.   Specialty:  Pulmonary Disease   Contact information:   [place] [address] [phone]        Consults obtained - None  Diet and Activity recommendation: See Discharge Instructions below  Discharge Instructions       Discharge Instructions    Diet - low sodium heart healthy    Complete by:  As directed      Discharge instructions    Complete by:  As directed   Follow with Primary MD REESE,BETTI D, MD in 7 days   Get CBC, CMP, 2 view Chest X ray checked  by Primary MD next visit.    Activity: As tolerated with Full fall precautions use walker/cane & assistance as needed   Disposition Home     Diet:   Heart Healthy   For Heart failure patients - Check your Weight same time everyday, if you gain over 2 pounds, or you develop in leg swelling, experience more shortness of breath or chest pain, call your Primary MD immediately. Follow Cardiac Low Salt Diet and 1.5 lit/day fluid restriction.   On your next visit with your  primary care physician please Get Medicines reviewed and adjusted.   Please request your Prim.MD to go over all Hospital Tests and Procedure/Radiological results at the follow up, please get all Hospital records sent to your Prim MD by signing hospital release before you go home.   If you experience worsening of your admission symptoms, develop shortness of breath, life threatening emergency, suicidal or homicidal thoughts you must seek medical attention immediately by calling 911 or calling your MD immediately  if symptoms less severe.  You Must read complete instructions/literature along with all the possible adverse reactions/side effects for all the Medicines you take and that have been prescribed to you. Take any new Medicines after you have completely understood and accpet all the possible adverse reactions/side effects.   Do not drive, operating heavy machinery, perform activities at heights, swimming or participation in water activities or provide baby sitting services if your were admitted for syncope or siezures until you have seen by Primary MD or a Neurologist and advised to do so again.  Do not drive when taking Pain medications.    Do not take more than prescribed Pain, Sleep and Anxiety Medications  Special Instructions: If you have smoked or chewed Tobacco  in the last 2 yrs please stop smoking, stop any regular Alcohol  and or any Recreational drug use.  Wear Seat belts while driving.   Please note  You were cared for by a hospitalist during your hospital stay. If you have any questions about your discharge medications or the care you received while you were in the hospital after you are discharged, you can call the unit and asked to speak with the hospitalist on call if the hospitalist that took care of you is not available. Once you are discharged, your primary care physician will handle any further medical issues. Please note that NO REFILLS for any discharge medications  will be authorized once you are discharged, as it is imperative that you return to your primary care physician (or establish a relationship with a primary care physician if you do not have one) for your aftercare needs so that they can reassess your need for medications and monitor your lab values.     Increase activity slowly    Complete by:  As directed              Discharge Medications       Medication List    STOP taking these medications        predniSONE 20 MG tablet  Commonly known as:  DELTASONE  Replaced by:  predniSONE  5 MG tablet      TAKE these medications        albuterol (2.5 MG/3ML) 0.083% nebulizer solution  Commonly known as:  PROVENTIL  Take 3 mLs (2.5 mg total) by nebulization every 6 (six) hours as needed. asthma     albuterol 108 (90 Base) MCG/ACT inhaler  Commonly known as:  PROVENTIL HFA;VENTOLIN HFA  Inhale 2 puffs into the lungs every 6 (six) hours as needed for wheezing.     budesonide-formoterol 160-4.5 MCG/ACT inhaler  Commonly known as:  SYMBICORT  Inhale 2 puffs into the lungs 2 (two) times daily.     fluticasone 50 MCG/ACT nasal spray  Commonly known as:  FLONASE  Place 2 sprays into the nose daily.     HYDROcodone-acetaminophen 5-325 MG tablet  Commonly known as:  NORCO/VICODIN  Take 1-2 tablets by mouth every 6 (six) hours as needed.     montelukast 10 MG tablet  Commonly known as:  SINGULAIR  Take 1 tablet (10 mg total) by mouth at bedtime.     pantoprazole 40 MG tablet  Commonly known as:  PROTONIX  Take 1 tablet (40 mg total) by mouth daily before breakfast.     predniSONE 5 MG tablet  Commonly known as:  DELTASONE  Label  & dispense according to the schedule below. 10 Pills PO for 3 days then, 8 Pills PO for 3 days, 6 Pills PO for 3 days, 4 Pills PO for 3 days, 2 Pills PO for 3 days, 1 Pills PO for 3 days, 1/2 Pill  PO for 3 days then STOP. Total 95 pills.        Major procedures and Radiology Reports - PLEASE review  detailed and final reports for all details, in brief -       Dg Chest Port 1 View  02/23/2015  CLINICAL DATA:  Shortness of breath, cough, and congestion. EXAM: PORTABLE CHEST 1 VIEW COMPARISON:  02/21/2015 FINDINGS: The cardiomediastinal silhouette is within normal limits. Thoracic aortic calcification is noted. The lungs are normally to mildly hyperinflated with slight chronic interstitial coarsening. No confluent airspace opacity, edema, pleural effusion, or pneumothorax is identified. No acute osseous abnormality is seen. IMPRESSION: No active disease. Electronically Signed   By: Logan Bores M.D.   On: 02/23/2015 13:24   Dg Chest Port 1 View  02/21/2015  CLINICAL DATA:  Acute onset of shortness of breath. Initial encounter. EXAM: PORTABLE CHEST 1 VIEW COMPARISON:  Chest radiograph performed 03/31/2012 FINDINGS: The lungs are hyperexpanded, with flattening of the hemidiaphragms, reflecting mild COPD. Mild bibasilar atelectasis is noted. There is no evidence of pleural effusion or pneumothorax. The cardiomediastinal silhouette is within normal limits. No acute osseous abnormalities are seen. IMPRESSION: Findings of COPD.  Mild bibasilar atelectasis noted. Electronically Signed   By: Garald Balding M.D.   On: 02/21/2015 04:53    Micro Results      No results found for this or any previous visit (from the past 240 hour(s)).     Today   Subjective    Marlise Hartz today has no headache,no chest abdominal pain,no new weakness tingling or numbness, feels much better wants to go home today.    Objective   Blood pressure 157/73, pulse 96, temperature 98.1 F (36.7 C), temperature source Oral, resp. rate 20, height 5\' 10"  (1.778 m), weight 86.41 kg (190 lb 8 oz), SpO2 96 %.   Intake/Output Summary (Last 24 hours) at 02/27/15 1221 Last data filed at 02/26/15 1700  Gross per 24 hour  Intake    360 ml  Output      0 ml  Net    360 ml    Exam Awake Alert, Oriented x 3, No new F.N  deficits, Normal affect [place].AT,PERRAL Supple Neck,No JVD, No cervical lymphadenopathy appriciated.  Symmetrical Chest wall movement, Good air movement bilaterally, mild wheezing RRR,No Gallops,Rubs or new Murmurs, No Parasternal Heave +ve B.Sounds, Abd Soft, Non tender, No organomegaly appriciated, No rebound -guarding or rigidity. No Cyanosis, Clubbing or edema, No new Rash or bruise   Data Review   CBC w Diff: Lab Results  Component Value Date   WBC 7.9 02/24/2015   WBC 14.7* 04/21/2011   HGB 10.6* 02/24/2015   HGB 12.1* 04/21/2011   HCT 33.2* 02/24/2015   HCT 39.4 04/21/2011   PLT 232 02/24/2015   LYMPHOPCT 27 02/24/2015   BANDSPCT 0 09/11/2010   MONOPCT 3 02/24/2015   EOSPCT 0 02/24/2015   BASOPCT 2 02/24/2015    CMP: Lab Results  Component Value Date   NA 142 02/24/2015   K 5.0 02/24/2015   CL 107 02/24/2015   CO2 27 02/24/2015   BUN 15 02/24/2015   CREATININE 0.75 02/24/2015   PROT 6.3* 02/24/2015   ALBUMIN 3.2* 02/24/2015   BILITOT 0.5 02/24/2015   ALKPHOS 100 02/24/2015   AST 20 02/24/2015   ALT 19 02/24/2015  .   Total Time in preparing paper work, data evaluation and todays exam - 35 minutes  Thurnell Lose M.D on 02/27/2015 at 12:21 PM  [facility]   Office  [phone]

## 2015-02-27 NOTE — Discharge Instructions (Signed)
Follow with Primary MD REESE,BETTI D, MD in 7 days   Get CBC, CMP, 2 view Chest X ray checked  by Primary MD next visit.    Activity: As tolerated with Full fall precautions use walker/cane & assistance as needed   Disposition Home     Diet:   Heart Healthy   For Heart failure patients - Check your Weight same time everyday, if you gain over 2 pounds, or you develop in leg swelling, experience more shortness of breath or chest pain, call your Primary MD immediately. Follow Cardiac Low Salt Diet and 1.5 lit/day fluid restriction.   On your next visit with your primary care physician please Get Medicines reviewed and adjusted.   Please request your Prim.MD to go over all Hospital Tests and Procedure/Radiological results at the follow up, please get all Hospital records sent to your Prim MD by signing hospital release before you go home.   If you experience worsening of your admission symptoms, develop shortness of breath, life threatening emergency, suicidal or homicidal thoughts you must seek medical attention immediately by calling 911 or calling your MD immediately  if symptoms less severe.  You Must read complete instructions/literature along with all the possible adverse reactions/side effects for all the Medicines you take and that have been prescribed to you. Take any new Medicines after you have completely understood and accpet all the possible adverse reactions/side effects.   Do not drive, operating heavy machinery, perform activities at heights, swimming or participation in water activities or provide baby sitting services if your were admitted for syncope or siezures until you have seen by Primary MD or a Neurologist and advised to do so again.  Do not drive when taking Pain medications.    Do not take more than prescribed Pain, Sleep and Anxiety Medications  Special Instructions: If you have smoked or chewed Tobacco  in the last 2 yrs please stop smoking, stop any regular  Alcohol  and or any Recreational drug use.  Wear Seat belts while driving.   Please note  You were cared for by a hospitalist during your hospital stay. If you have any questions about your discharge medications or the care you received while you were in the hospital after you are discharged, you can call the unit and asked to speak with the hospitalist on call if the hospitalist that took care of you is not available. Once you are discharged, your primary care physician will handle any further medical issues. Please note that NO REFILLS for any discharge medications will be authorized once you are discharged, as it is imperative that you return to your primary care physician (or establish a relationship with a primary care physician if you do not have one) for your aftercare needs so that they can reassess your need for medications and monitor your lab values.

## 2015-03-29 ENCOUNTER — Encounter: Payer: Self-pay | Admitting: Internal Medicine

## 2015-03-29 ENCOUNTER — Ambulatory Visit (INDEPENDENT_AMBULATORY_CARE_PROVIDER_SITE_OTHER): Payer: Medicare Other | Admitting: Internal Medicine

## 2015-03-29 VITALS — BP 126/82 | HR 74 | Ht 69.75 in | Wt 206.0 lb

## 2015-03-29 DIAGNOSIS — J4531 Mild persistent asthma with (acute) exacerbation: Secondary | ICD-10-CM | POA: Diagnosis not present

## 2015-03-29 MED ORDER — PREDNISONE 10 MG PO TABS: ORAL_TABLET | ORAL | Status: DC

## 2015-03-29 NOTE — Patient Instructions (Signed)
Plan A = Symbicort 80 Take 2 puffs first thing in am and then another 2 puffs about 12 hours later.   Work on inhaler technique:  relax and gently blow all the way out then take a nice smooth deep breath back in, triggering the inhaler at same time you start breathing in.  Hold for up to 5 seconds if you can. Blow out thru nose. Rinse and gargle with water when done    Plan B = back -up Only use your albuterol (ventolin) as a rescue medication to be used if you can't catch your breath by resting or doing a relaxed purse lip breathing pattern.  - The less you use it, the better it will work when you need it. - Ok to use up to 2 puffs  every 4 hours if you must but call for immediate appointment if use goes up over your usual need - Don't leave home without it !!  (think of it like the spare tire for your car)   Plan C = Crisis Ok to use nebulizer if you must up to every 4 hours if needed   Pantoprazole (protonix) 40 mg   Take  30-60 min before first meal of the day and Pepcid (famotidine)  20 mg one @  bedtime until return to office - this is the best way to tell whether stomach acid is contributing to your problem.    GERD (REFLUX)  is an extremely common cause of respiratory symptoms just like yours , many times with no obvious heartburn at all.    It can be treated with medication, but also with lifestyle changes including elevation of the head of your bed (ideally with 6 inch  bed blocks),  Smoking cessation, avoidance of late meals, excessive alcohol, and avoid fatty foods, chocolate, peppermint, colas, red wine, and acidic juices such as orange juice.  NO MINT OR MENTHOL PRODUCTS SO NO COUGH DROPS  USE SUGARLESS CANDY INSTEAD (Jolley ranchers or Stover's or Life Savers) or even ice chips will also do - the key is to swallow to prevent all throat clearing.  NO OIL BASED VITAMINS - use powdered substitutes.    Prednisone 10 mg take  4 each am x 2 days,   2 each am x 2 days,  1 each am x 2  days and stop   Please schedule a follow up office visit in 6 weeks, call sooner if needed with pfts on return  When return bring your medications in 2 separate bags, the ones you take no matter(automatically)  what vs the as needed (only when you feel you need them)

## 2015-03-29 NOTE — Progress Notes (Signed)
Subjective:     Patient ID: [name], female   DOB: 11/26/1945    MRN: [id number]    Brief patient profile:  49 yobf remote minimal smoker quit completely in the 1970's with dtc asthma since 2007 eval for the first time by the pulmonary service during admit [facility] in July 2012 with prominent upper airway "wheezing" with essentially nl pft's 10/01/2012    HPI 09/20/2010 f/u ov/Lashan Gluth cc better breathing since discharge on symbicort 160 2 bid and tapering prednisone to be off by 8/4.  No purulent sputum. No need for saba daytime.   rec  GERD diet Work on Doctor, hospital technique:  .Continue symbicort 160 Take 2 puffs first thing in am and then another 2 puffs about 12 hours later.  Protonix 40 mg Take 30-60 min before first meal of the day and Pepcid 20 mg on at bedtime Only use the tramadol if coughing Finish tapering prednisone off   02/08/2011 f/u ov/Triston Lisanti did not stop singulair  cc  breathing doing well, no complaints - no need for daytime saba still on symbicort 160 2 bid and singulair, nasonex. No cough or urge to clear throat rec Ok try singulair off to see if it makes any difference > it didn't        Admit date: 03/31/2012  Discharge date: 04/05/2012  DISCHARGE DIAGNOSES:  Principal Problem:  Acute asthma exacerbation    05/20/2013 f/u ov/Sanchez Hemmer re: chronic asthma  Chief Complaint  Patient presents with  . Asthma    Breathing is doing well. Reports slight wheeze at this time. Denies SOB, coughing, chest tightness.   no need for saba, Not limited by breathing from desired activities   rec Continue symbicort 160 Take 2 puffs first thing in am and then another 2 puffs about 12 hours later.  Only use your albuterol (ventolin) as a rescue medication  Admission date: 02/23/2015 Admitting Physician Waldemar Dickens, MD  Discharge Date: 02/27/2015   Admission Diagnosis Asthma exacerbation [J45.901]  Discharge Diagnosis Asthma exacerbation [J45.901]   Principal  Problem:  Asthma exacerbation Active Problems:  Polycythemia  Hyperglycemia   03/29/2015 ext post hosp f/u ov/transition of care/Adryana Mogensen re: s/p hosp for asthma now maint on symb 160/ singulair /ppi Chief Complaint  Patient presents with  . HFU    Pt states her breathing is much improved, almost back to her normal baseline. She uses albuterol inhaler 1 x per wk on average and has not needed neb.    did not follow any of the action plans previously discussed/ was not on gerd rx with flare but was still apparently taking symbicort 160 2bid   No obvious daytime variabilty or assoc  cough cp or chest tightness, subjective wheeze overt sinus or hb symptoms. No unusual exp hx     Sleeping ok without nocturnal  or early am exacerbation  of respiratory  c/o's or need for noct saba. Also denies any obvious fluctuation of symptoms with weather or environmental changes or other aggravating or alleviating factors except as outlined above   ROS  The following are not active complaints unless bolded sore throat, dysphagia, dental problems, itching, sneezing,  nasal congestion or excess/ purulent secretions, ear ache,   fever, chills, sweats, unintended wt loss, pleuritic or exertional cp, hemoptysis,  orthopnea pnd or leg swelling, presyncope, palpitations, heartburn, abdominal pain, anorexia, nausea, vomiting, diarrhea  or change in bowel or urinary habits, change in stools or urine, dysuria,hematuria,  rash, arthralgias, visual complaints, headache, numbness  weakness or ataxia or problems with walking or coordination,  change in mood/affect or memory.        Objective:   Physical Exam  amb bf nad   wt   192  10/06/10 > 11/08/2010  192 > 02/08/2011 201> 05/09/2011  196> 08/09/2011  198 >11/13/2011 191 > 05/01/2012 188 > 186 08/20/2012 > 10/01/2012  190 > 05/20/2013 204 > 03/29/2015  206   HEENT: nl dentition, turbinates, and orophanx. Nl external ear canals without cough reflex   NECK :  without JVD/Nodes/TM/  nl carotid upstrokes bilaterally   LUNGS: no acc muscle use, clear to A and P bilaterally without cough on insp or exp maneuvers   CV:  RRR  no s3 or murmur or increase in P2, no edema   ABD:  soft and nontender with nl excursion in the supine position. No bruits or organomegaly, bowel sounds nl  MS:  warm without deformities, calf tenderness, cyanosis or clubbing  SKIN: warm and dry without lesions    NEURO:  alert, approp, no deficits   Assessment:

## 2015-04-04 NOTE — Assessment & Plan Note (Signed)
-   Sinus CT 09/12/10 Previous bilateral nasoantral windows. Mild mucosal inflammation,  particularly notable in the maxillary, ethmoid and sphenoid  sinuses. No advanced sinusitis or free fluid. - Barium swallow nl 09/12/10 - PFTs 08/09/2011 FEV1  1.24 (50%) ratio 68 and no change p B2, DLCO 67 improves to 133 c/w minimal airflow obstruction - PFT's 10/01/2012 FEV2  1.63 (72%) ratio 76 and no change p B2, DLCO 76 improvede to 105  - 03/29/2015 p extensive coaching HFA effectiveness =   75%  S/p another preventable admit/ was not on ppi/ did not call when started to deteriorate. DDX of  difficult airways management almost all start with A and  include Adherence, Ace Inhibitors, Acid Reflux, Active Sinus Disease, Alpha 1 Antitripsin deficiency, Anxiety masquerading as Airways dz,  ABPA,  Allergy(esp in young), Aspiration (esp in elderly), Adverse effects of meds,  Active smokers, A bunch of PE's (a small clot burden can't cause this syndrome unless there is already severe underlying pulm or vascular dz with poor reserve) plus two Bs  = Bronchiectasis and Beta blocker use..and one C= CHF   Adherence is always the initial "prime suspect" and is a multilayered concern that requires a "trust but verify" approach in every patient - starting with knowing how to use medications, especially inhalers, correctly, keeping up with refills and understanding the fundamental difference between maintenance and prns vs those medications only taken for a very short course and then stopped and not refilled.  - - The proper method of use, as well as anticipated side effects, of a metered-dose inhaler are discussed and demonstrated to the patient. Improved effectiveness after extensive coaching during this visit to a level of approximately 75 % from a baseline of 50 %   ? Acid (or non-acid) GERD > always difficult to exclude as up to 75% of pts in some series report no assoc GI/ Heartburn symptoms> rec continue max (24h)  acid  suppression and diet restrictions/ reviewed     ? Allergy > taper off pred then regroup/ not previously no benefit from singulair but ok to continue  I had an extended discussion with the patient reviewing all relevant studies completed to date and  lasting 25 minutes of a 40 minute transition of care office post hosp visit    Each maintenance medication was reviewed in detail including most importantly the difference between maintenance and prns and under what circumstances the prns are to be triggered using an action plan format that is not reflected in the computer generated alphabetically organized AVS.    Please see instructions for details which were reviewed in writing and the patient given a copy highlighting the part that I personally wrote and discussed at today's ov.

## 2015-05-13 ENCOUNTER — Encounter: Payer: Self-pay | Admitting: Internal Medicine

## 2015-05-13 ENCOUNTER — Ambulatory Visit (INDEPENDENT_AMBULATORY_CARE_PROVIDER_SITE_OTHER): Payer: Medicare Other | Admitting: Internal Medicine

## 2015-05-13 ENCOUNTER — Telehealth: Payer: Self-pay | Admitting: Internal Medicine

## 2015-05-13 VITALS — BP 118/76 | HR 94 | Temp 98.3°F | Ht 69.75 in | Wt 206.2 lb

## 2015-05-13 DIAGNOSIS — J4531 Mild persistent asthma with (acute) exacerbation: Secondary | ICD-10-CM | POA: Diagnosis not present

## 2015-05-13 DIAGNOSIS — J45901 Unspecified asthma with (acute) exacerbation: Secondary | ICD-10-CM

## 2015-05-13 DIAGNOSIS — R6889 Other general symptoms and signs: Secondary | ICD-10-CM

## 2015-05-13 LAB — POCT INFLUENZA A/B
INFLUENZA A, POC: NEGATIVE
Influenza B, POC: NEGATIVE

## 2015-05-13 MED ORDER — PREDNISONE 10 MG PO TABS: ORAL_TABLET | ORAL | Status: DC

## 2015-05-13 MED ORDER — METHYLPREDNISOLONE ACETATE 80 MG/ML IJ SUSP
80.0000 mg | Freq: Once | INTRAMUSCULAR | Status: AC
  Administered 2015-05-13: 80 mg via INTRAMUSCULAR

## 2015-05-13 MED ORDER — OSELTAMIVIR PHOSPHATE 75 MG PO CAPS: 75.0000 mg | ORAL_CAPSULE | Freq: Two times a day (BID) | ORAL | Status: DC

## 2015-05-13 NOTE — Telephone Encounter (Signed)
Spoke with pt. She is experiencing flu like symptoms. CY is going to see her this morning. Nothing further was needed.

## 2015-05-13 NOTE — Patient Instructions (Signed)
Nasal swab flu assay  Script sent for Tamiflu  To take one twice daily  Depo 80  Script to hold for prednisone taper in case needed for worse wheezing this weekend  From now on, please be sure to get a flu shot every Fall- October or November  Rest and stay well- hydrated till you feel better

## 2015-05-13 NOTE — Progress Notes (Signed)
Subjective:     Patient ID: [name], female   DOB: 10-28-1945    MRN: [id number]    Brief patient profile:  70 yobf remote minimal smoker quit completely in the 1970's with dtc asthma since 2007 eval for the first time by the pulmonary service during admit [facility] in July 2012 with prominent upper airway "wheezing" with essentially nl pft's 10/01/2012    HPI 09/20/2010 f/u ov/Wert cc better breathing since discharge on symbicort 160 2 bid and tapering prednisone to be off by 8/4.  No purulent sputum. No need for saba daytime.   rec  GERD diet Work on Doctor, hospital technique:  .Continue symbicort 160 Take 2 puffs first thing in am and then another 2 puffs about 12 hours later.  Protonix 40 mg Take 30-60 min before first meal of the day and Pepcid 20 mg on at bedtime Only use the tramadol if coughing Finish tapering prednisone off   02/08/2011 f/u ov/Wert did not stop singulair  cc  breathing doing well, no complaints - no need for daytime saba still on symbicort 160 2 bid and singulair, nasonex. No cough or urge to clear throat rec Ok try singulair off to see if it makes any difference > it didn't   Admit date: 03/31/2012  Discharge date: 04/05/2012  DISCHARGE DIAGNOSES:  Principal Problem:  Acute asthma exacerbation    05/20/2013 f/u ov/Wert re: chronic asthma  Chief Complaint  Patient presents with  . Asthma    Breathing is doing well. Reports slight wheeze at this time. Denies SOB, coughing, chest tightness.   no need for saba, Not limited by breathing from desired activities   rec Continue symbicort 160 Take 2 puffs first thing in am and then another 2 puffs about 12 hours later.  Only use your albuterol (ventolin) as a rescue medication  Admission date: 02/23/2015 Admitting Physician Waldemar Dickens, MD  Discharge Date: 02/27/2015   Admission Diagnosis Asthma exacerbation [J45.901]  Discharge Diagnosis Asthma exacerbation [J45.901]   Principal Problem:   Asthma exacerbation Active Problems:  Polycythemia  Hyperglycemia   03/29/2015 ext post hosp f/u ov/transition of care/Wert re: s/p hosp for asthma now maint on symb 160/ singulair /ppi Chief Complaint  Patient presents with  . HFU    Pt states her breathing is much improved, almost back to her normal baseline. She uses albuterol inhaler 1 x per wk on average and has not needed neb.    did not follow any of the action plans previously discussed/ was not on gerd rx with flare but was still apparently taking symbicort 160 2bid   05/13/2015-70 year old female never smoker followed for severe asthma ACUTE VISIT: MW patient. ? flu; fever, chills, cough,dry and itchy throat, SOB after coughing as well. [name] has flu. Temp 98.3, sat 96%.She had flu shot. Exposed all week to sick grandson with confirmed flu. Acute onset yesterday evening with chills, cough clear mucus, generalized aching, scratchy throat, postnasal drip. Nasal Swab 05/13/2015-negative for influenza  ROS-see HPI   Negative unless "+" Constitutional:    weight loss, night sweats, fevers, +chills, fatigue, lassitude. HEENT:    headaches, difficulty swallowing, tooth/dental problems, sore throat,       sneezing, itching, ear ache, nasal congestion, post nasal drip, snoring CV:    chest pain, orthopnea, PND, swelling in lower extremities, anasarca,  dizziness, palpitations Resp:   shortness of breath with exertion or at rest.                +productive cough,   non-productive cough, coughing up of blood.              change in color of mucus.  wheezing.   Skin:    rash or lesions. GI:  No-   heartburn, indigestion, abdominal pain, nausea, vomiting, diarrhea,                 change in bowel habits, loss of appetite GU: dysuria, change in color of urine, no urgency or frequency.   flank pain. MS:   joint pain, stiffness, decreased range of motion, back pain. Neuro-     nothing  unusual Psych:  change in mood or affect.  depression or anxiety.   memory loss.    Objective:  OBJ- Physical Exam General- Alert, Oriented, Affect-appropriate, Distress- none acute Skin- rash-none, lesions- none, excoriation- none Lymphadenopathy- none Head- atraumatic            Eyes- Gross vision intact, PERRLA, conjunctivae and secretions clear            Ears- Hearing, canals-normal            Nose- Clear, no-Septal dev, mucus, polyps, erosion, perforation             Throat- Mallampati II , mucosa clear , drainage- none, tonsils- atrophic Neck- flexible , trachea midline, no stridor , thyroid nl, carotid no bruit Chest - symmetrical excursion , unlabored           Heart/CV- RRR , no murmur , no gallop  , no rub, nl s1 s2                           - JVD- none , edema- none, stasis changes- none, varices- none           Lung-  wheeze+, cough- none , dullness-none, rub- none           Chest wall-  Abd-  Br/ Gen/ Rectal- Not done, not indicated Extrem- cyanosis- none, clubbing, none, atrophy- none, strength- nl Neuro- grossly intact to observation    Assessment:

## 2015-05-16 NOTE — Assessment & Plan Note (Signed)
Increased wheeze in the context of an acute illness consistent with a viral respiratory infection. Given sustained exposure to confirmed flu, it is prudent to offer Tamiflu. Plan-Tamiflu, stay well-hydrated, Depo-Medrol, print prescription for prednisone taper to hold in case needed this weekend. Contact office if not doing substantially better by first week.

## 2015-05-28 ENCOUNTER — Ambulatory Visit (INDEPENDENT_AMBULATORY_CARE_PROVIDER_SITE_OTHER): Payer: Medicare Other | Admitting: Internal Medicine

## 2015-05-28 ENCOUNTER — Encounter: Payer: Self-pay | Admitting: Internal Medicine

## 2015-05-28 VITALS — BP 142/80 | HR 62 | Ht 69.0 in | Wt 203.0 lb

## 2015-05-28 DIAGNOSIS — J4530 Mild persistent asthma, uncomplicated: Secondary | ICD-10-CM

## 2015-05-28 DIAGNOSIS — J4531 Mild persistent asthma with (acute) exacerbation: Secondary | ICD-10-CM | POA: Diagnosis not present

## 2015-05-28 LAB — PULMONARY FUNCTION TEST
DL/VA % pred: 93 %
DL/VA: 5.01 ml/min/mmHg/L
DLCO unc % pred: 65 %
DLCO unc: 20.38 ml/min/mmHg
FEF 25-75 POST: 1.24 L/s
FEF 25-75 PRE: 0.93 L/s
FEF2575-%CHANGE-POST: 33 %
FEF2575-%PRED-POST: 62 %
FEF2575-%Pred-Pre: 46 %
FEV1-%Change-Post: 6 %
FEV1-%PRED-POST: 71 %
FEV1-%PRED-PRE: 66 %
FEV1-POST: 1.6 L
FEV1-PRE: 1.51 L
FEV1FVC-%CHANGE-POST: 4 %
FEV1FVC-%Pred-Pre: 93 %
FEV6-%CHANGE-POST: 0 %
FEV6-%PRED-PRE: 75 %
FEV6-%Pred-Post: 75 %
FEV6-PRE: 2.1 L
FEV6-Post: 2.11 L
FEV6FVC-%PRED-POST: 103 %
FEV6FVC-%Pred-Pre: 103 %
FVC-%CHANGE-POST: 1 %
FVC-%PRED-POST: 73 %
FVC-%Pred-Pre: 72 %
FVC-Post: 2.14 L
FVC-Pre: 2.1 L
Post FEV1/FVC ratio: 75 %
Post FEV6/FVC ratio: 100 %
Pre FEV1/FVC ratio: 72 %
Pre FEV6/FVC Ratio: 100 %

## 2015-05-28 NOTE — Patient Instructions (Signed)
No change in medications    Please schedule a follow up visit in 6  months but call sooner if needed  

## 2015-05-28 NOTE — Progress Notes (Signed)
PFT done today. 

## 2015-05-28 NOTE — Progress Notes (Signed)
Subjective:     Patient ID: [name], female   DOB: 1945-09-19    MRN: [id number]    Brief patient profile:  23 yobf remote minimal smoker quit completely in the 1970's with dtc asthma since 2007 eval for the first time by the pulmonary service during admit [facility] in July 2012 with prominent upper airway "wheezing" with essentially nl pft's 10/01/2012    HPI 09/20/2010 f/u ov/Wert cc better breathing since discharge on symbicort 160 2 bid and tapering prednisone to be off by 8/4.  No purulent sputum. No need for saba daytime.   rec  GERD diet Work on Doctor, hospital technique:  .Continue symbicort 160 Take 2 puffs first thing in am and then another 2 puffs about 12 hours later.  Protonix 40 mg Take 30-60 min before first meal of the day and Pepcid 20 mg on at bedtime Only use the tramadol if coughing Finish tapering prednisone off   02/08/2011 f/u ov/Wert did not stop singulair  cc  breathing doing well, no complaints - no need for daytime saba still on symbicort 160 2 bid and singulair, nasonex. No cough or urge to clear throat rec Ok try singulair off to see if it makes any difference > it didn't        Admit date: 03/31/2012  Discharge date: 04/05/2012  DISCHARGE DIAGNOSES:  Principal Problem:  Acute asthma exacerbation    05/20/2013 f/u ov/Wert re: chronic asthma  Chief Complaint  Patient presents with  . Asthma    Breathing is doing well. Reports slight wheeze at this time. Denies SOB, coughing, chest tightness.   no need for saba, Not limited by breathing from desired activities   rec Continue symbicort 160 Take 2 puffs first thing in am and then another 2 puffs about 12 hours later.  Only use your albuterol (ventolin) as a rescue medication  Admission date: 02/23/2015 Admitting Physician Waldemar Dickens, MD  Discharge Date: 02/27/2015   Admission Diagnosis Asthma exacerbation [J45.901]  Discharge Diagnosis Asthma exacerbation [J45.901]   Principal  Problem:  Asthma exacerbation Active Problems:  Polycythemia  Hyperglycemia   03/29/2015 ext post hosp f/u ov/transition of care/Wert re: s/p hosp for asthma now maint on symb 160/ singulair /ppi Chief Complaint  Patient presents with  . HFU    Pt states her breathing is much improved, almost back to her normal baseline. She uses albuterol inhaler 1 x per wk on average and has not needed neb.    did not follow any of the action plans previously discussed/ was not on gerd rx with flare but was still apparently taking symbicort 160 2bid  rec Plan A = Symbicort 80 Take 2 puffs first thing in am and then another 2 puffs about 12 hours later.  Work on inhaler technique:  relax and gently blow all the way out then take a nice smooth deep breath back in, triggering the inhaler at same time you start breathing in.  Hold for up to 5 seconds if you can. Blow out thru nose. Rinse and gargle with water when done Plan B = back -up Only use your albuterol (ventolin) as a rescue medication Plan C = Crisis Ok to use nebulizer if you must up to every 4 hours if needed  Pantoprazole (protonix) 40 mg   Take  30-60 min before first meal of the day and Pepcid (famotidine)  20 mg one @  bedtime until return to office - this is the best way to  tell whether stomach acid is contributing to your problem.   GERD diet  Prednisone 10 mg take  4 each am x 2 days,   2 each am x 2 days,  1 each am x 2 days and stop    05/13/15  Acute eval/ young rx tamiflu and depo 80/ did not take the pred rx > symb 160 2bid    05/28/2015  f/u ov/Wert re:  Mild persistent asthma / symbicort 160 2bid/singulair/ gerd rx  Chief Complaint  Patient presents with  . Follow-up    PFT done today. Pt states that her breathing is back to her normal baseline. She is able to walk up stairs easier. She rarely coughs. She has not needed albuterol.  Not limited by breathing from desired activities     No obvious daytime variabilty or assoc  excess/ purulent sputum or mucus plugs   cp or chest tightness, subjective wheeze overt sinus or hb symptoms. No unusual exp hx     Sleeping ok without nocturnal  or early am exacerbation  of respiratory  c/o's or need for noct saba. Also denies any obvious fluctuation of symptoms with weather or environmental changes or other aggravating or alleviating factors except as outlined above   ROS  The following are not active complaints unless bolded sore throat, dysphagia, dental problems, itching, sneezing,  nasal congestion or excess/ purulent secretions, ear ache,   fever, chills, sweats, unintended wt loss, pleuritic or exertional cp, hemoptysis,  orthopnea pnd or leg swelling, presyncope, palpitations, heartburn, abdominal pain, anorexia, nausea, vomiting, diarrhea  or change in bowel or urinary habits, change in stools or urine, dysuria,hematuria,  rash, arthralgias, visual complaints, headache, numbness weakness or ataxia or problems with walking or coordination,  change in mood/affect or memory.        Objective:   Physical Exam  amb bf nad   wt   192  10/06/10 > 11/08/2010  192 > 02/08/2011 201> 05/09/2011  196> 08/09/2011  198 >11/13/2011 191 > 05/01/2012 188 > 186 08/20/2012 > 10/01/2012  190 > 05/20/2013 204 > 03/29/2015  206 > 05/28/2015  203   HEENT: nl dentition, turbinates, and orophanx. Nl external ear canals without cough reflex   NECK :  without JVD/Nodes/TM/ nl carotid upstrokes bilaterally   LUNGS: no acc muscle use, clear to A and P bilaterally without cough on insp or exp maneuvers   CV:  RRR  no s3 or murmur or increase in P2, no edema   ABD:  soft and nontender with nl excursion in the supine position. No bruits or organomegaly, bowel sounds nl  MS:  warm without deformities, calf tenderness, cyanosis or clubbing  SKIN: warm and dry without lesions    NEURO:  alert, approp, no deficits    I personally reviewed images and agree with radiology impression as follows:  pCXR:   02/23/15 No active disease  Assessment:

## 2015-05-30 ENCOUNTER — Encounter: Payer: Self-pay | Admitting: Internal Medicine

## 2015-05-30 NOTE — Assessment & Plan Note (Addendum)
-  Sinus CT 09/12/10 Previous bilateral nasoantral windows. Mild mucosal inflammation,  particularly notable in the maxillary, ethmoid and sphenoid  sinuses. No advanced sinusitis or free fluid. - Barium swallow nl 09/12/10 - PFTs 08/09/2011 FEV1  1.24 (50%) ratio 68 and no change p B2, DLCO 67 improves to 133 c/w minimal airflow obstruction - PFT's 10/01/2012 FEV2  1.63 (72%) ratio 76 and no change p B2, DLCO 76 improvede to 105    - 05/28/2015 p extensive coaching HFA effectiveness =   90% - PFT's  05/28/2015  FEV1 1.60  (71% ) ratio 75  p 6 % improvement from saba p nothing x 12 h prior to study with DLCO  65 % corrects to 93 % for alv volume    Historically does fine as long as on maint rx and taking it correctly and again All goals of chronic asthma control met including optimal function and elimination of symptoms with minimal need for rescue therapy.  .     I had an extended discussion with the patient reviewing all relevant studies completed to date and  lasting 15 to 20 minutes of a 25 minute visit   Contingencies discussed in full including contacting this office immediately if not controlling the symptoms using the rule of two's   Each maintenance medication was reviewed in detail including most importantly the difference between maintenance and prns and under what circumstances the prns are to be triggered using an action plan format that is not reflected in the computer generated alphabetically organized AVS.    Please see instructions for details which were reviewed in writing and the patient given a copy highlighting the part that I personally wrote and discussed at today's ov.

## 2015-11-26 ENCOUNTER — Ambulatory Visit (INDEPENDENT_AMBULATORY_CARE_PROVIDER_SITE_OTHER)
Admission: RE | Admit: 2015-11-26 | Discharge: 2015-11-26 | Disposition: A | Discharge status: Home / Self Care | Payer: Medicare Other | Source: Ambulatory Visit | Attending: Internal Medicine | Admitting: Internal Medicine

## 2015-11-26 ENCOUNTER — Encounter: Payer: Self-pay | Admitting: Internal Medicine

## 2015-11-26 ENCOUNTER — Ambulatory Visit (INDEPENDENT_AMBULATORY_CARE_PROVIDER_SITE_OTHER): Payer: Medicare Other | Admitting: Internal Medicine

## 2015-11-26 VITALS — BP 144/70 | HR 110 | Temp 98.2°F | Ht 69.75 in | Wt 171.0 lb

## 2015-11-26 DIAGNOSIS — J4531 Mild persistent asthma with (acute) exacerbation: Secondary | ICD-10-CM | POA: Diagnosis not present

## 2015-11-26 MED ORDER — FAMOTIDINE 20 MG PO TABS: ORAL_TABLET | ORAL | Status: AC

## 2015-11-26 MED ORDER — PREDNISONE 10 MG PO TABS: ORAL_TABLET | ORAL | 0 refills | Status: DC

## 2015-11-26 NOTE — Patient Instructions (Signed)
Prednisone 10 mg take  4 each am x 2 days,   2 each am x 2 days,  1 each am x 2 days and stop   Best cough medicine is mucinex dm up to 1200 mg every 12 hours as needed  GERD (REFLUX)  is an extremely common cause of respiratory symptoms just like yours , many times with no obvious heartburn at all.    It can be treated with medication, but also with lifestyle changes including elevation of the head of your bed (ideally with 6 inch  bed blocks),  Smoking cessation, avoidance of late meals, excessive alcohol, and avoid fatty foods, chocolate, peppermint, colas, red wine, and acidic juices such as orange juice.  NO MINT OR MENTHOL PRODUCTS SO NO COUGH DROPS   USE SUGARLESS CANDY INSTEAD (Jolley ranchers or Stover's or Life Savers) or even ice chips will also do - the key is to swallow to prevent all throat clearing. NO OIL BASED VITAMINS - use powdered substitutes.  Please remember to go to the  x-ray department downstairs for your tests - we will call you with the results when they are available.    Please schedule a follow up office visit in 2 weeks, sooner if needed

## 2015-11-26 NOTE — Progress Notes (Signed)
Subjective:     Patient ID: [name], female   DOB: June 09, 1945    MRN: [id number]    Brief patient profile:  15 yobf remote minimal smoker quit completely in the 1970's with dtc asthma since 2007 eval for the first time by the pulmonary service during admit [facility] in July 2012 with prominent upper airway "wheezing" with essentially nl pft's 10/01/2012     History of Present Illness  09/20/2010 f/u ov/Wert cc better breathing since discharge on symbicort 160 2 bid and tapering prednisone to be off by 8/4.  No purulent sputum. No need for saba daytime.   rec  GERD diet Work on Doctor, hospital technique:  .Continue symbicort 160 Take 2 puffs first thing in am and then another 2 puffs about 12 hours later.  Protonix 40 mg Take 30-60 min before first meal of the day and Pepcid 20 mg on at bedtime Only use the tramadol if coughing Finish tapering prednisone off   02/08/2011 f/u ov/Wert did not stop singulair  cc  breathing doing well, no complaints - no need for daytime saba still on symbicort 160 2 bid and singulair, nasonex. No cough or urge to clear throat rec Ok try singulair off to see if it makes any difference > it didn't        Admit date: 03/31/2012  Discharge date: 04/05/2012  DISCHARGE DIAGNOSES:  Principal Problem:  Acute asthma exacerbation    05/20/2013 f/u ov/Wert re: chronic asthma  Chief Complaint  Patient presents with  . Asthma    Breathing is doing well. Reports slight wheeze at this time. Denies SOB, coughing, chest tightness.   no need for saba, Not limited by breathing from desired activities   rec Continue symbicort 160 Take 2 puffs first thing in am and then another 2 puffs about 12 hours later.  Only use your albuterol (ventolin) as a rescue medication  Admission date: 02/23/2015 Admitting Physician Waldemar Dickens, MD  Discharge Date: 02/27/2015   Admission Diagnosis Asthma exacerbation [J45.901]  Discharge Diagnosis Asthma exacerbation  [J45.901]   Principal Problem:  Asthma exacerbation Active Problems:  Polycythemia  Hyperglycemia   03/29/2015 ext post hosp f/u ov/transition of care/Wert re: s/p hosp for asthma now maint on symb 160/ singulair /ppi Chief Complaint  Patient presents with  . HFU    Pt states her breathing is much improved, almost back to her normal baseline. She uses albuterol inhaler 1 x per wk on average and has not needed neb.    did not follow any of the action plans previously discussed/ was not on gerd rx with flare but was still apparently taking symbicort 160 2bid  rec Plan A = Symbicort 80 Take 2 puffs first thing in am and then another 2 puffs about 12 hours later.  Work on inhaler technique:  relax and gently blow all the way out then take a nice smooth deep breath back in, triggering the inhaler at same time you start breathing in.  Hold for up to 5 seconds if you can. Blow out thru nose. Rinse and gargle with water when done Plan B = back -up Only use your albuterol (ventolin) as a rescue medication Plan C = Crisis Ok to use nebulizer if you must up to every 4 hours if needed  Pantoprazole (protonix) 40 mg   Take  30-60 min before first meal of the day and Pepcid (famotidine)  20 mg one @  bedtime until return to office - this  is the best way to tell whether stomach acid is contributing to your problem.   GERD diet  Prednisone 10 mg take  4 each am x 2 days,   2 each am x 2 days,  1 each am x 2 days and stop    05/13/15  Acute eval/ young rx tamiflu and depo 80/ did not take the pred rx > symb 160 2bid    05/28/2015  f/u ov/Wert re:  Mild persistent asthma / symbicort 160 2bid/singulair/ gerd rx  Chief Complaint  Patient presents with  . Follow-up    PFT done today. Pt states that her breathing is back to her normal baseline. She is able to walk up stairs easier. She rarely coughs. She has not needed albuterol.  Not limited by breathing from desired activities   rec No changed rx     11/26/2015 acute extended ov/Wert re: mild asthma/ symb 160 / singulair /gerd rx  Chief Complaint  Patient presents with  . Acute Visit    Pt c/o "throat full of mucus" for the past 2 days. She is able to cough up minimal amount of yellow sputum. She also c/o vomiting and diarrhea for the past 2 days.   onset 11/25/15 9 am  dizzy acutely then vomiting and diarrhea > resolved overnight  Today feels congested throat, mild chill no NVD   No obvious daytime variabilty or assoc mucus plugs   cp or chest tightness, subjective wheeze overt sinus or hb symptoms. No unusual exp hx     Sleeping ok without nocturnal  or early am exacerbation  of respiratory  c/o's or need for noct saba. Also denies any obvious fluctuation of symptoms with weather or environmental changes or other aggravating or alleviating factors except as outlined above   ROS  The following are not active complaints unless bolded sore throat, dysphagia, dental problems, itching, sneezing,  nasal congestion or excess/ purulent secretions, ear ache,   fever, chills, sweats, unintended wt loss, pleuritic or exertional cp, hemoptysis,  orthopnea pnd or leg swelling, presyncope, palpitations, heartburn, abdominal pain, anorexia, nausea, vomiting, diarrhea  or change in bowel or urinary habits, change in stools or urine, dysuria,hematuria,  rash, arthralgias, visual complaints, headache, numbness weakness or ataxia or problems with walking or coordination,  change in mood/affect or memory.        Objective:   Physical Exam  amb bf nad / vital signs reviewed - Note on arrival 02 sats  100% on RA    wt   192  10/06/10 > 11/08/2010  192 > 02/08/2011 201> 05/09/2011  196> 08/09/2011  198 >11/13/2011 191 > 05/01/2012 188 > 186 08/20/2012 > 10/01/2012  190 > 05/20/2013 204 > 03/29/2015  206 > 05/28/2015  203 > 11/26/2015 171   HEENT: nl dentition, turbinates, and orophanx. Nl external ear canals without cough reflex   NECK :  without JVD/Nodes/TM/ nl  carotid upstrokes bilaterally   LUNGS: no acc muscle use, insp and exp rhonchi bilaterally but mostly upper airway    CV:  RRR  no s3 or murmur or increase in P2, no edema   ABD:  soft and nontender with nl excursion in the supine position. No bruits or organomegaly, bowel sounds nl  MS:  warm without deformities, calf tenderness, cyanosis or clubbing  SKIN: warm and dry without lesions    NEURO:  alert, approp, no deficits      CXR PA and Lateral:   11/26/2015 :  I personally reviewed images and agree with radiology impression as follows:   No acute cardiopulmonary disease.     Assessment:

## 2015-11-26 NOTE — Progress Notes (Signed)
LMTCB

## 2015-11-27 ENCOUNTER — Encounter: Payer: Self-pay | Admitting: Internal Medicine

## 2015-11-27 NOTE — Assessment & Plan Note (Addendum)
In setting of apparent viral infection with NVD but no evidence aspiration or pna   rec Add pepcid hs whenever coughing and max gerd diet  Prednisone 10 mg take  4 each am x 2 days,   2 each am x 2 days,  1 each am x 2 days and stop  F/u in 2 weeks/ sooner  if not better p prednisone and back to baseline  I had an extended discussion with the patient reviewing all relevant studies completed to date and  lasting 15 to 20 minutes of a 25 minute visit    Each maintenance medication was reviewed in detail including most importantly the difference between maintenance and prns and under what circumstances the prns are to be triggered using an action plan format that is not reflected in the computer generated alphabetically organized AVS.    Please see instructions for details which were reviewed in writing and the patient given a copy highlighting the part that I personally wrote and discussed at today's ov.

## 2015-11-29 ENCOUNTER — Ambulatory Visit: Payer: Self-pay | Admitting: Internal Medicine

## 2015-12-02 NOTE — Progress Notes (Signed)
ATC, fast busy tone, WCB

## 2015-12-06 NOTE — Progress Notes (Signed)
atc line busy

## 2015-12-07 NOTE — Progress Notes (Signed)
Left detailed msg.

## 2015-12-16 ENCOUNTER — Encounter: Payer: Self-pay | Admitting: Internal Medicine

## 2015-12-16 ENCOUNTER — Ambulatory Visit (INDEPENDENT_AMBULATORY_CARE_PROVIDER_SITE_OTHER): Payer: Medicare Other | Admitting: Internal Medicine

## 2015-12-16 VITALS — BP 140/70 | HR 90 | Ht 69.75 in | Wt 168.0 lb

## 2015-12-16 DIAGNOSIS — J45901 Unspecified asthma with (acute) exacerbation: Secondary | ICD-10-CM | POA: Diagnosis not present

## 2015-12-16 LAB — NITRIC OXIDE: NITRIC OXIDE: 41

## 2015-12-16 MED ORDER — PREDNISONE 10 MG PO TABS: ORAL_TABLET | ORAL | 0 refills | Status: DC

## 2015-12-16 MED ORDER — BUDESONIDE-FORMOTEROL FUMARATE 160-4.5 MCG/ACT IN AERO: 2.0000 | INHALATION_SPRAY | Freq: Two times a day (BID) | RESPIRATORY_TRACT | 12 refills | Status: DC

## 2015-12-16 MED ORDER — PANTOPRAZOLE SODIUM 40 MG PO TBEC: 40.0000 mg | DELAYED_RELEASE_TABLET | Freq: Every day | ORAL | 4 refills | Status: AC

## 2015-12-16 NOTE — Progress Notes (Signed)
Subjective:     Patient ID: [name], female   DOB: 24-Oct-1945    MRN: [id number]    Brief patient profile:  89 yobf remote minimal smoker quit completely in the 1970's with dtc asthma since 2007 eval for the first time by the pulmonary service during admit [facility] in July 2012 with prominent upper airway "wheezing" with essentially nl pft's 10/01/2012     History of Present Illness  09/20/2010 f/u ov/Karryn Kosinski cc better breathing since discharge on symbicort 160 2 bid and tapering prednisone to be off by 8/4.  No purulent sputum. No need for saba daytime.   rec  GERD diet Work on Doctor, hospital technique:  .Continue symbicort 160 Take 2 puffs first thing in am and then another 2 puffs about 12 hours later.  Protonix 40 mg Take 30-60 min before first meal of the day and Pepcid 20 mg on at bedtime Only use the tramadol if coughing Finish tapering prednisone off   02/08/2011 f/u ov/Zafirah Vanzee did not stop singulair  cc  breathing doing well, no complaints - no need for daytime saba still on symbicort 160 2 bid and singulair, nasonex. No cough or urge to clear throat rec Ok try singulair off to see if it makes any difference > it didn't        Admit date: 03/31/2012  Discharge date: 04/05/2012  DISCHARGE DIAGNOSES:  Principal Problem:  Acute asthma exacerbation    05/20/2013 f/u ov/Rooney Gladwin re: chronic asthma  Chief Complaint  Patient presents with  . Asthma    Breathing is doing well. Reports slight wheeze at this time. Denies SOB, coughing, chest tightness.   no need for saba, Not limited by breathing from desired activities   rec Continue symbicort 160 Take 2 puffs first thing in am and then another 2 puffs about 12 hours later.  Only use your albuterol (ventolin) as a rescue medication  Admission date: 02/23/2015 Admitting Physician Waldemar Dickens, MD  Discharge Date: 02/27/2015   Admission Diagnosis Asthma exacerbation [J45.901]  Discharge Diagnosis Asthma exacerbation  [J45.901]   Principal Problem:  Asthma exacerbation Active Problems:  Polycythemia  Hyperglycemia   03/29/2015 ext post hosp f/u ov/transition of care/Wyatte Dames re: s/p hosp for asthma now maint on symb 160/ singulair /ppi Chief Complaint  Patient presents with  . HFU    Pt states her breathing is much improved, almost back to her normal baseline. She uses albuterol inhaler 1 x per wk on average and has not needed neb.    did not follow any of the action plans previously discussed/ was not on gerd rx with flare but was still apparently taking symbicort 160 2bid  rec Plan A = Symbicort 80 Take 2 puffs first thing in am and then another 2 puffs about 12 hours later.  Work on inhaler technique:  relax and gently blow all the way out then take a nice smooth deep breath back in, triggering the inhaler at same time you start breathing in.  Hold for up to 5 seconds if you can. Blow out thru nose. Rinse and gargle with water when done Plan B = back -up Only use your albuterol (ventolin) as a rescue medication Plan C = Crisis Ok to use nebulizer if you must up to every 4 hours if needed  Pantoprazole (protonix) 40 mg   Take  30-60 min before first meal of the day and Pepcid (famotidine)  20 mg one @  bedtime until return to office - this  is the best way to tell whether stomach acid is contributing to your problem.   GERD diet  Prednisone 10 mg take  4 each am x 2 days,   2 each am x 2 days,  1 each am x 2 days and stop    05/13/15  Acute eval/ young rx tamiflu and depo 80/ did not take the pred rx > symb 160 2bid    05/28/2015  f/u ov/Ericca Labra re:  Mild persistent asthma / symbicort 160 2bid/singulair/ gerd rx  Chief Complaint  Patient presents with  . Follow-up    PFT done today. Pt states that her breathing is back to her normal baseline. She is able to walk up stairs easier. She rarely coughs. She has not needed albuterol.  Not limited by breathing from desired activities   rec No changed rx     11/26/2015 acute extended ov/Kymani Laursen re: mild asthma/ symb 160 / singulair /gerd rx  Chief Complaint  Patient presents with  . Acute Visit    Pt c/o "throat full of mucus" for the past 2 days. She is able to cough up minimal amount of yellow sputum. She also c/o vomiting and diarrhea for the past 2 days.   onset 11/25/15 9 am  dizzy acutely then vomiting and diarrhea > resolved overnight  Today feels congested throat, mild chills no NVD rec Prednisone 10 mg take  4 each am x 2 days,   2 each am x 2 days,  1 each am x 2 days and stop  Best cough medicine is mucinex dm up to 1200 mg every 12 hours as needed GERD diet        12/16/2015  f/u ov/Minnie Legros re: post asthma flare check up  Chief Complaint  Patient presents with  . Follow-up    Cough and congestion are improving. She is producing some yellow sputum. She reports her breathing is doing well. She has not needed albuterol.    Still some chest congestion lingering but much better since last ov and ? What next   Not limited by breathing from desired activities    No obvious daytime variabilty or assoc mucus plugs   cp or chest tightness, subjective wheeze overt sinus or hb symptoms. No unusual exp hx     Sleeping ok without nocturnal  or early am exacerbation  of respiratory  c/o's or need for noct saba. Also denies any obvious fluctuation of symptoms with weather or environmental changes or other aggravating or alleviating factors except as outlined above   ROS  The following are not active complaints unless bolded sore throat, dysphagia, dental problems, itching, sneezing,  nasal congestion or excess/ purulent secretions, ear ache,   fever, chills, sweats, unintended wt loss, pleuritic or exertional cp, hemoptysis,  orthopnea pnd or leg swelling, presyncope, palpitations, heartburn, abdominal pain, anorexia, nausea, vomiting, diarrhea  or change in bowel or urinary habits, change in stools or urine, dysuria,hematuria,  rash, arthralgias,  visual complaints, headache, numbness weakness or ataxia or problems with walking or coordination,  change in mood/affect or memory.        Objective:   Physical Exam  amb bf nad / vital signs reviewed -  - Note on arrival 02 sats  99% on RA      wt   192  10/06/10 > 11/08/2010  192 > 02/08/2011 201> 05/09/2011  196> 08/09/2011  198 >11/13/2011 191 > 05/01/2012 188 > 186 08/20/2012 > 10/01/2012  190 > 05/20/2013 204 > 03/29/2015  206 > 05/28/2015  203 > 11/26/2015 171 > 12/16/2015  168   HEENT: nl dentition, turbinates, and orophanx. Nl external ear canals without cough reflex   NECK :  without JVD/Nodes/TM/ nl carotid upstrokes bilaterally   LUNGS: no acc muscle use, min exp rhonchi bilaterally    CV:  RRR  no s3 or murmur or increase in P2, no edema   ABD:  soft and nontender with nl excursion in the supine position. No bruits or organomegaly, bowel sounds nl  MS:  warm without deformities, calf tenderness, cyanosis or clubbing  SKIN: warm and dry without lesions    NEURO:  alert, approp, no deficits      CXR PA and Lateral:   11/26/2015 :    I personally reviewed images and agree with radiology impression as follows:   No acute cardiopulmonary disease.     Assessment:

## 2015-12-16 NOTE — Patient Instructions (Addendum)
Prednisone 10 mg take  4 each am x 2 days,   2 each am x 2 days,  1 each am x 2 days and stop   Continue all your other medications as they are   Please schedule a follow up visit in 3 months but call sooner if needed

## 2015-12-16 NOTE — Assessment & Plan Note (Signed)
-   Sinus CT 09/12/10 Previous bilateral nasoantral windows. Mild mucosal inflammation,  particularly notable in the maxillary, ethmoid and sphenoid  sinuses. No advanced sinusitis or free fluid. - Barium swallow nl 09/12/10 - PFTs 08/09/2011 FEV1  1.24 (50%) ratio 68 and no change p B2, DLCO 67 improves to 133 c/w minimal airflow obstruction - PFT's 10/01/2012 FEV2  1.63 (72%) ratio 76 and no change p B2, DLCO 76 improvede to 105  - 05/28/2015 p extensive coaching HFA effectiveness =   90% - PFT's  05/28/2015  FEV1 1.60  (71% ) ratio 75  p 6 % improvement from saba p nothing x 12 h prior to study with DLCO  65 % corrects to 93 % for alv volume     FENO 12/16/2015  =   41 on symb 160 2bid /singulair 10 mg q pm  Probably not completely over recent flare so rec Prednisone 10 mg take  4 each am x 2 days,   2 each am x 2 days,  1 each am x 2 days and stop and f/u in 3 months unless not back to 100% baseline on symbicort 160/singulair maint rx  I had an extended discussion with the patient reviewing all relevant studies completed to date and  lasting 15 to 20 minutes of a 25 minute visit    Each maintenance medication was reviewed in detail including most importantly the difference between maintenance and prns and under what circumstances the prns are to be triggered using an action plan format that is not reflected in the computer generated alphabetically organized AVS.    Please see instructions for details which were reviewed in writing and the patient given a copy highlighting the part that I personally wrote and discussed at today's ov.

## 2015-12-25 ENCOUNTER — Emergency Department (HOSPITAL_COMMUNITY): Payer: Medicare Other

## 2015-12-25 ENCOUNTER — Encounter (HOSPITAL_COMMUNITY): Payer: Self-pay

## 2015-12-25 ENCOUNTER — Emergency Department (HOSPITAL_COMMUNITY)
Admission: EM | Admit: 2015-12-25 | Discharge: 2015-12-25 | Disposition: A | Discharge status: Home / Self Care | Payer: Medicare Other | Attending: Emergency Medicine | Admitting: Emergency Medicine

## 2015-12-25 DIAGNOSIS — D649 Anemia, unspecified: Secondary | ICD-10-CM

## 2015-12-25 DIAGNOSIS — R509 Fever, unspecified: Secondary | ICD-10-CM

## 2015-12-25 DIAGNOSIS — R112 Nausea with vomiting, unspecified: Secondary | ICD-10-CM

## 2015-12-25 DIAGNOSIS — K529 Noninfective gastroenteritis and colitis, unspecified: Secondary | ICD-10-CM | POA: Diagnosis not present

## 2015-12-25 DIAGNOSIS — J45909 Unspecified asthma, uncomplicated: Secondary | ICD-10-CM | POA: Diagnosis not present

## 2015-12-25 DIAGNOSIS — R1110 Vomiting, unspecified: Secondary | ICD-10-CM | POA: Diagnosis present

## 2015-12-25 DIAGNOSIS — R197 Diarrhea, unspecified: Secondary | ICD-10-CM

## 2015-12-25 DIAGNOSIS — D72828 Other elevated white blood cell count: Secondary | ICD-10-CM | POA: Diagnosis not present

## 2015-12-25 DIAGNOSIS — A059 Bacterial foodborne intoxication, unspecified: Secondary | ICD-10-CM

## 2015-12-25 DIAGNOSIS — D729 Disorder of white blood cells, unspecified: Secondary | ICD-10-CM

## 2015-12-25 LAB — CBC
HEMATOCRIT: 36.9 % (ref 36.0–46.0)
Hemoglobin: 11.4 g/dL — ABNORMAL LOW (ref 12.0–15.0)
MCH: 22.1 pg — AB (ref 26.0–34.0)
MCHC: 30.9 g/dL (ref 30.0–36.0)
MCV: 71.4 fL — AB (ref 78.0–100.0)
PLATELETS: 256 10*3/uL (ref 150–400)
RBC: 5.17 MIL/uL — ABNORMAL HIGH (ref 3.87–5.11)
RDW: 16.4 % — AB (ref 11.5–15.5)
WBC: 13.4 10*3/uL — ABNORMAL HIGH (ref 4.0–10.5)

## 2015-12-25 LAB — LIPASE, BLOOD: LIPASE: 51 U/L (ref 11–51)

## 2015-12-25 LAB — URINALYSIS, ROUTINE W REFLEX MICROSCOPIC
Bilirubin Urine: NEGATIVE
GLUCOSE, UA: NEGATIVE mg/dL
Ketones, ur: 15 mg/dL — AB
Leukocytes, UA: NEGATIVE
Nitrite: NEGATIVE
PH: 7 (ref 5.0–8.0)
PROTEIN: NEGATIVE mg/dL
Specific Gravity, Urine: 1.009 (ref 1.005–1.030)

## 2015-12-25 LAB — URINE MICROSCOPIC-ADD ON: BACTERIA UA: NONE SEEN

## 2015-12-25 LAB — C DIFFICILE QUICK SCREEN W PCR REFLEX
C DIFFICILE (CDIFF) INTERP: NOT DETECTED
C DIFFICILE (CDIFF) TOXIN: NEGATIVE
C Diff antigen: NEGATIVE

## 2015-12-25 LAB — COMPREHENSIVE METABOLIC PANEL
ALBUMIN: 3.6 g/dL (ref 3.5–5.0)
ALT: 19 U/L (ref 14–54)
AST: 20 U/L (ref 15–41)
Alkaline Phosphatase: 134 U/L — ABNORMAL HIGH (ref 38–126)
Anion gap: 10 (ref 5–15)
BUN: 10 mg/dL (ref 6–20)
CO2: 25 mmol/L (ref 22–32)
CREATININE: 0.68 mg/dL (ref 0.44–1.00)
Calcium: 9.4 mg/dL (ref 8.9–10.3)
Chloride: 102 mmol/L (ref 101–111)
GFR calc Af Amer: >60 mL/min (ref >60)
GFR calc non Af Amer: >60 mL/min (ref >60)
Glucose, Bld: 112 mg/dL — ABNORMAL HIGH (ref 65–99)
Potassium: 3.6 mmol/L (ref 3.5–5.1)
SODIUM: 137 mmol/L (ref 135–145)
Total Bilirubin: 0.9 mg/dL (ref 0.3–1.2)
Total Protein: 7.1 g/dL (ref 6.5–8.1)

## 2015-12-25 LAB — DIFFERENTIAL
BASOS PCT: 0 %
Basophils Absolute: 0 10*3/uL (ref 0.0–0.1)
Eosinophils Absolute: 0.2 10*3/uL (ref 0.0–0.7)
Eosinophils Relative: 2 %
LYMPHS ABS: 1.4 10*3/uL (ref 0.7–4.0)
Lymphocytes Relative: 11 %
MONO ABS: 0.8 10*3/uL (ref 0.1–1.0)
MONOS PCT: 7 %
NEUTROS ABS: 10.1 10*3/uL — AB (ref 1.7–7.7)
Neutrophils Relative %: 80 %

## 2015-12-25 LAB — POC OCCULT BLOOD, ED: Fecal Occult Bld: POSITIVE — AB

## 2015-12-25 LAB — I-STAT CG4 LACTIC ACID, ED: Lactic Acid, Venous: 0.86 mmol/L (ref 0.5–1.9)

## 2015-12-25 MED ORDER — ONDANSETRON 4 MG PO TBDP: 4.0000 mg | ORAL_TABLET | Freq: Three times a day (TID) | ORAL | 0 refills | Status: DC | PRN

## 2015-12-25 MED ORDER — LOPERAMIDE HCL 2 MG PO CAPS: 2.0000 mg | ORAL_CAPSULE | Freq: Four times a day (QID) | ORAL | 0 refills | Status: DC | PRN

## 2015-12-25 MED ORDER — ONDANSETRON HCL 4 MG/2ML IJ SOLN
4.0000 mg | Freq: Once | INTRAMUSCULAR | Status: AC
  Administered 2015-12-25: 4 mg via INTRAVENOUS
  Filled 2015-12-25: qty 2

## 2015-12-25 MED ORDER — SODIUM CHLORIDE 0.9 % IV BOLUS (SEPSIS)
1000.0000 mL | Freq: Once | INTRAVENOUS | Status: AC
  Administered 2015-12-25: 1000 mL via INTRAVENOUS

## 2015-12-25 MED ORDER — SODIUM CHLORIDE 0.9 % IV BOLUS (SEPSIS)
500.0000 mL | Freq: Once | INTRAVENOUS | Status: AC
  Administered 2015-12-25: 500 mL via INTRAVENOUS

## 2015-12-25 MED ORDER — SODIUM CHLORIDE 0.9 % IV SOLN
1000.0000 mL | INTRAVENOUS | Status: DC
  Administered 2015-12-25: 1000 mL via INTRAVENOUS

## 2015-12-25 MED ORDER — ACETAMINOPHEN 325 MG PO TABS
650.0000 mg | ORAL_TABLET | Freq: Once | ORAL | Status: AC
  Administered 2015-12-25: 650 mg via ORAL
  Filled 2015-12-25: qty 2

## 2015-12-25 MED ORDER — LOPERAMIDE HCL 2 MG PO CAPS
4.0000 mg | ORAL_CAPSULE | Freq: Once | ORAL | Status: AC
  Administered 2015-12-25: 4 mg via ORAL
  Filled 2015-12-25: qty 2

## 2015-12-25 NOTE — ED Notes (Signed)
Pt to xray

## 2015-12-25 NOTE — Discharge Instructions (Signed)
Use zofran as prescribed, as needed for nausea. Use tylenol or motrin as needed for pain or fever. Stay well hydrated with small sips of fluids throughout the day. Use imodium only as absolutely needed for severe persistent diarrhea, but try to avoid using it if possible, and do NOT use for longer than 3 days. Follow a BRAT (banana-rice-applesauce-toast) diet as described below for the next 24-48 hours. The 'BRAT' diet is suggested, then progress to diet as tolerated as symptoms abate. Call your regular doctor if bloody stools, persistent diarrhea, vomiting, fever or abdominal pain. Follow up with your regular doctor in 3-5 days for recheck of symptoms. Return to ER for changing or worsening of symptoms.

## 2015-12-25 NOTE — ED Triage Notes (Signed)
Patient awoke with vomiting and diarrhea this am. denis abdominal pain. States that she cant keep anything down. Alert and oriented.

## 2015-12-25 NOTE — ED Notes (Signed)
Patient Alert and oriented X4. Stable and ambulatory. Patient verbalized understanding of the discharge instructions.  Patient belongings were taken by the patient.  

## 2015-12-25 NOTE — ED Notes (Signed)
Pt tolerating fluids and crackers.

## 2015-12-25 NOTE — ED Provider Notes (Signed)
[facility] DE[facility] Provider Note   CSN: [id number] Arrival date & time: 12/25/15  1502     History   Chief Complaint Chief Complaint  Patient presents with  . Emesis  . Diarrhea    HPI Torin Tippins is a 70 y.o. female with a PMHx of GERD, heart murmur, polycythemia, and asthma, and a PSHx of tubal ligation, who presents to the ED with complaints of nausea, vomiting, diarrhea, and fever began this morning when she woke up around 6 AM. Patient reports 6-7 episodes of nonbloody nonbilious emesis today, and 6 episodes of watery nonbloody diarrhea. She arrived here with a temperature of 100.1. She took Pepto-Bismol at home with some relief of her symptoms, no known aggravating factors. She denies any other associated symptoms. She states that last night she was fine, although she states that she had [place] for dinner and some cake at home. Her family states that she was the only one to eat the cake, and wonder whether this could've been the cause; no one else got sick from their [place] meal. They deny that the cake is out of date, state that the sell by date was 12/24/15.  She denies any chest pain, shortness breath, cough, rhinorrhea, sore throat, other URI symptoms, abdominal pain, hematemesis, melena, hematochezia, obstipation, constipation, dysuria, hematuria, numbness, tingling, focal weakness, recent travel, sick contacts, alcohol use, or chronic NSAID use. No other abdominal surgeries aside from tubal ligation. No recent abx.    The history is provided by the patient and medical records. No language interpreter was used.  Emesis   This is a new problem. The current episode started 6 to 12 hours ago. The problem occurs 5 to 10 times per day. The problem has not changed since onset.The emesis has an appearance of stomach contents. The maximum temperature recorded prior to her arrival was 100 to 100.9 F. The fever has been present for less than 1 day. Associated symptoms include  diarrhea and a fever (100.1 here). Pertinent negatives include no abdominal pain, no arthralgias, no cough, no myalgias and no URI. Risk factors include suspect food intake.  Diarrhea   Associated symptoms include vomiting. Pertinent negatives include no abdominal pain, no arthralgias, no myalgias, no URI and no cough.    Past Medical History:  Diagnosis Date  . Asthma   . GERD (gastroesophageal reflux disease)   . Heart murmur     Patient Active Problem List   Diagnosis Date Noted  . Asthma exacerbation 02/23/2015  . Polycythemia 02/23/2015  . Hyperglycemia 02/23/2015  . Asthma, chronic 05/04/2012  . Acute asthma exacerbation 03/31/2012  . Hypokalemia 03/31/2012    Past Surgical History:  Procedure Laterality Date  . CATARACT EXTRACTION W/ INTRAOCULAR LENS  IMPLANT, BILATERAL  2011 &2009  . TONSILLECTOMY    . TUBAL LIGATION      OB History    No data available       Home Medications    Prior to Admission medications   Medication Sig Start Date End Date Taking? Authorizing Provider  albuterol (PROVENTIL HFA;VENTOLIN HFA) 108 (90 Base) MCG/ACT inhaler Inhale 2 puffs into the lungs every 6 (six) hours as needed for wheezing. 02/27/15  Yes Thurnell Lose, MD  albuterol (PROVENTIL) (2.5 MG/3ML) 0.083% nebulizer solution Take 3 mLs (2.5 mg total) by nebulization every 6 (six) hours as needed. asthma Patient taking differently: Take 2.5 mg by nebulization every 6 (six) hours as needed for wheezing.  04/05/12  Yes Bonnielee Haff, MD  budesonide-formoterol (SYMBICORT) 160-4.5 MCG/ACT inhaler Inhale 2 puffs into the lungs 2 (two) times daily. 12/16/15  Yes Tanda Rockers, MD  famotidine (PEPCID) 20 MG tablet One at bedtime Patient taking differently: Take 20 mg by mouth at bedtime. One at bedtime 11/26/15  Yes Tanda Rockers, MD  fluticasone [facility]) 50 MCG/ACT nasal spray Place 2 sprays into the nose daily. Patient taking differently: Place 2 sprays into both nostrils daily.   11/28/11  Yes Tanda Rockers, MD  montelukast (SINGULAIR) 10 MG tablet Take 1 tablet (10 mg total) by mouth at bedtime. 02/27/15  Yes Thurnell Lose, MD  pantoprazole (PROTONIX) 40 MG tablet Take 1 tablet (40 mg total) by mouth daily before breakfast. 12/16/15  Yes Tanda Rockers, MD  predniSONE (DELTASONE) 10 MG tablet Take  4 each am x 2 days,   2 each am x 2 days,  1 each am x 2 days and stop 12/16/15  Yes Tanda Rockers, MD    Family History Family History  Problem Relation Age of Onset  . Asthma Sister   . Emphysema Father     was a smoker  . Hypertension Mother   . Hypertension Sister     Social History Social History  Substance Use Topics  . Smoking status: Never Smoker  . Smokeless tobacco: Never Used  . Alcohol use No     Allergies   Aspirin   Review of Systems Review of Systems  Constitutional: Positive for fever (100.1 here).  HENT: Negative for rhinorrhea and sore throat.   Respiratory: Negative for cough and shortness of breath.   Cardiovascular: Negative for chest pain.  Gastrointestinal: Positive for diarrhea, nausea and vomiting. Negative for abdominal pain, blood in stool and constipation.  Genitourinary: Negative for dysuria and hematuria.  Musculoskeletal: Negative for arthralgias and myalgias.  Skin: Negative for color change.  Allergic/Immunologic: Negative for immunocompromised state.  Neurological: Negative for weakness and numbness.  Psychiatric/Behavioral: Negative for confusion.   10 Systems reviewed and are negative for acute change except as noted in the HPI.   Physical Exam Updated Vital Signs BP 157/83   Pulse 92   Temp 100.1 F (37.8 C) (Oral)   Resp 16   SpO2 100%   Physical Exam  Constitutional: She is oriented to person, place, and time. Vital signs are normal. She appears well-developed and well-nourished.  Non-toxic appearance. No distress.  Low grade temp 100.1, nontoxic, NAD although appears dehydrated  HENT:  Head:  Normocephalic and atraumatic.  Mouth/Throat: Oropharynx is clear and moist. Mucous membranes are dry.  Dry mucous membranes  Eyes: Conjunctivae and EOM are normal. Right eye exhibits no discharge. Left eye exhibits no discharge.  Neck: Normal range of motion. Neck supple.  Cardiovascular: Normal rate, regular rhythm and intact distal pulses.  Exam reveals no gallop and no friction rub.   Murmur heard.  Systolic murmur is present  Systolic murmur best heard at left sternal border; reg rate and rhythm, nl s1/s2, no rubs/gallops, distal pulses intact  Pulmonary/Chest: Effort normal and breath sounds normal. No respiratory distress. She has no decreased breath sounds. She has no wheezes. She has no rhonchi. She has no rales.  CTAB in all lung fields, no w/r/r, no hypoxia or increased WOB, speaking in full sentences, SpO2 100% on RA   Abdominal: Soft. Normal appearance and bowel sounds are normal. She exhibits no distension. There is no tenderness. There is no rigidity, no rebound, no guarding, no CVA tenderness, no  tenderness at McBurney's point and negative Murphy's sign.  Soft, NTND, +BS throughout, no r/g/r, neg murphy's, neg mcburney's, no CVA TTP   Musculoskeletal: Normal range of motion.  Neurological: She is alert and oriented to person, place, and time. She has normal strength. No sensory deficit.  Skin: Skin is warm, dry and intact. No rash noted.  Psychiatric: She has a normal mood and affect.  Nursing note and vitals reviewed.    ED Treatments / Results  Labs (all labs ordered are listed, but only abnormal results are displayed) Labs Reviewed  COMPREHENSIVE METABOLIC PANEL - Abnormal; Notable for the following:       Result Value   Glucose, Bld 112 (*)    Alkaline Phosphatase 134 (*)    All other components within normal limits  CBC - Abnormal; Notable for the following:    WBC 13.4 (*)    RBC 5.17 (*)    Hemoglobin 11.4 (*)    MCV 71.4 (*)    MCH 22.1 (*)    RDW 16.4 (*)      All other components within normal limits  URINALYSIS, ROUTINE W REFLEX MICROSCOPIC (NOT AT [facility]) - Abnormal; Notable for the following:    Hgb urine dipstick TRACE (*)    Ketones, ur 15 (*)    All other components within normal limits  DIFFERENTIAL - Abnormal; Notable for the following:    Neutro Abs 10.1 (*)    All other components within normal limits  URINE MICROSCOPIC-ADD ON - Abnormal; Notable for the following:    Squamous Epithelial / LPF 0-5 (*)    All other components within normal limits  POC OCCULT BLOOD, ED - Abnormal; Notable for the following:    Fecal Occult Bld POSITIVE (*)    All other components within normal limits  C DIFFICILE QUICK SCREEN W PCR REFLEX  URINE CULTURE  CULTURE, BLOOD (ROUTINE X 2)  CULTURE, BLOOD (ROUTINE X 2)  GASTROINTESTINAL PANEL BY PCR, STOOL (REPLACES STOOL CULTURE)  LIPASE, BLOOD  I-STAT CG4 LACTIC ACID, ED    EKG  EKG Interpretation  Date/Time:  Saturday December 25 2015 16:06:24 EDT Ventricular Rate:  92 PR Interval:    QRS Duration: 83 QT Interval:  400 QTC Calculation: 495 R Axis:   74 Text Interpretation:  Sinus rhythm Probable left atrial enlargement Left ventricular hypertrophy Borderline prolonged QT interval When compared with ECG of 04/01/2007, No significant change was found Confirmed by [facility]  MD, DAVID ([id number]) on 12/25/2015 4:23:44 PM       Radiology Dg Chest 2 View  Result Date: 12/25/2015 CLINICAL DATA:  Fever EXAM: CHEST  2 VIEW COMPARISON:  11/26/2015 chest radiograph. FINDINGS: Stable cardiomediastinal silhouette with normal heart size and aortic atherosclerosis. No pneumothorax. No pleural effusion. Lungs appear clear, with no acute consolidative airspace disease and no pulmonary edema. IMPRESSION: No active cardiopulmonary disease.  Aortic atherosclerosis. Electronically Signed   By: Ilona Sorrel M.D.   On: 12/25/2015 17:54    Procedures Procedures (including critical care time)  Medications Ordered in  ED Medications  0.9 %  sodium chloride infusion (1,000 mLs Intravenous New Bag/Given 12/25/15 1617)  sodium chloride 0.9 % bolus 1,000 mL (0 mLs Intravenous Stopped 12/25/15 1919)    And  sodium chloride 0.9 % bolus 1,000 mL (0 mLs Intravenous Stopped 12/25/15 1919)    And  sodium chloride 0.9 % bolus 500 mL (0 mLs Intravenous Stopped 12/25/15 1749)  ondansetron (ZOFRAN) injection 4 mg (4 mg Intravenous Given 12/25/15  1618)  acetaminophen (TYLENOL) tablet 650 mg (650 mg Oral Given 12/25/15 1618)  loperamide (IMODIUM) capsule 4 mg (4 mg Oral Given 12/25/15 1640)     Initial Impression / Assessment and Plan / ED Course  I have reviewed the triage vital signs and the nursing notes.  Pertinent labs & imaging results that were available during my care of the patient were reviewed by me and considered in my medical decision making (see chart for details).  Clinical Course    70 y.o. female here with n/v/d x10 hours, no abdominal pain, no other symptoms. On exam, no abdominal TTP, low-grade fever 100.1, mildly dry mucous membranes, but nontoxic appearing. Not quite meeting sepsis criteria, and doubt need for code sepsis to be called. CBC showing leukocytosis 13.4, and chronic anemia; will add-on differential. CMP and lipase pending. Will add-on lactic. U/A not yet obtained, will send for culture when obtained. Will also get BCx sent. Will also attempt to get GI panel and C.diff panel from stool sample if she can provide one; FOBT on stool sample if she provides one. Will get CXR, although she denies cough and lungs are clear, will evaluate for all sources of infection. Will give zofran, tylenol for low-grade temp, and weight based fluids. Doubt need for other emergent imaging at this time, or empiric abx given that this still seems like a viral etiology at this point, and abx could make diarrhea worse. Discussed case with my attending Dr. Roxanne Mins who agrees with plan, would also like me to give imodium now as  well, but otherwise agrees with plan. Will reassess shortly  7:06 PM Pt resting comfortably, feels much better at this time. Lactic 0.86 which is reassuring. Lipase WNL. CMP with very marginally elevated alk phos which could just be from vomiting/dehydration; no other abnormalities on CMP. Differential showing slight neutrophilic predominance but otherwise unremarkable. U/A not yet collected, pt feels she could provide a sample now. CXR neg. GI panel pending. C.diff testing neg. FOBT+ which does raise the concern for infectious etiology of her diarrhea, but more likely is a false positive due to pepto bismol intake. EKG unchanged from prior. Pt tolerating PO well at this time. VS improving after meds/fluids. Likely viral etiology of symptoms; doubt need for empiric abx. Awaiting U/A then likely d/c home with zofran. Will reassess shortly  8:12 PM U/A with no nitrites or leuks, very few ketones likely from dehydration, 0-5 squamous, no bacteria; doubt UTI. Pt continues to feel improved, vitals continue to be improved. Discussed use of imodium only as needed for severe ongoing diarrhea, but avoid if possible, and no longer than 2-3 days use. Rx for zofran given. BRAT diet discussed. Tylenol/motrin for pain/fever. F/up with PCP in 3-4 days for recheck. I explained the diagnosis and have given explicit precautions to return to the ER including for any other new or worsening symptoms. The patient understands and accepts the medical plan as it's been dictated and I have answered their questions. Discharge instructions concerning home care and prescriptions have been given. The patient is STABLE and is discharged to home in good condition.   BP 121/59   Pulse 98   Temp 99.8 F (37.7 C) (Oral)   Resp 21   SpO2 93%    Final Clinical Impressions(s) / ED Diagnoses   Final diagnoses:  Nausea vomiting and diarrhea  Fever in adult  Chronic anemia  Neutrophilic leukocytosis  Foodborne gastroenteritis     New Prescriptions New Prescriptions   LOPERAMIDE (  IMODIUM) 2 MG CAPSULE    Take 1 capsule (2 mg total) by mouth 4 (four) times daily as needed for diarrhea or loose stools. Take 4 mg once PO followed by 2 mg PO after each loose stool up to a maximum of 16 mg/day; DO NOT TAKE FOR LONGER THAN 3 DAYS   ONDANSETRON (ZOFRAN ODT) 4 MG DISINTEGRATING TABLET    Take 1 tablet (4 mg total) by mouth every 8 (eight) hours as needed for nausea or vomiting.     Zacarias Pontes, PA-C 52/84/13 2440    Delora Fuel, MD 12/17/23 [id number]    Delora Fuel, MD 40/34/74 [id number]

## 2015-12-26 ENCOUNTER — Telehealth (HOSPITAL_BASED_OUTPATIENT_CLINIC_OR_DEPARTMENT_OTHER): Payer: Self-pay

## 2015-12-26 LAB — URINE CULTURE

## 2015-12-26 LAB — BLOOD CULTURE ID PANEL (REFLEXED)
Acinetobacter baumannii: NOT DETECTED
CANDIDA KRUSEI: NOT DETECTED
CANDIDA TROPICALIS: NOT DETECTED
Candida albicans: NOT DETECTED
Candida glabrata: NOT DETECTED
Candida parapsilosis: NOT DETECTED
ENTEROCOCCUS SPECIES: DETECTED — AB
ESCHERICHIA COLI: NOT DETECTED
Enterobacter cloacae complex: NOT DETECTED
Enterobacteriaceae species: NOT DETECTED
Haemophilus influenzae: NOT DETECTED
Klebsiella oxytoca: NOT DETECTED
Klebsiella pneumoniae: NOT DETECTED
LISTERIA MONOCYTOGENES: NOT DETECTED
Methicillin resistance: NOT DETECTED
NEISSERIA MENINGITIDIS: NOT DETECTED
PROTEUS SPECIES: NOT DETECTED
Pseudomonas aeruginosa: NOT DETECTED
SERRATIA MARCESCENS: NOT DETECTED
STAPHYLOCOCCUS SPECIES: DETECTED — AB
STREPTOCOCCUS PYOGENES: NOT DETECTED
STREPTOCOCCUS SPECIES: NOT DETECTED
Staphylococcus aureus (BCID): NOT DETECTED
Streptococcus agalactiae: NOT DETECTED
Streptococcus pneumoniae: NOT DETECTED
Vancomycin resistance: NOT DETECTED

## 2015-12-26 LAB — GASTROINTESTINAL PANEL BY PCR, STOOL (REPLACES STOOL CULTURE)

## 2015-12-26 NOTE — Telephone Encounter (Signed)
PT with + BC Gm + cocci in clusters in ;aerobic bottle only ID panel shows Staph species and Enterococcus species.  Report taken to [facility]. After discussing with Dr. Baxter Flattery ID, The patient was called for Symptom check. Pt states she is much better. No fevers. INstructed to make appt with PCP as soon as possible and to request they repeat her BC. Stated understanding. Also advised to return is further symptoms.

## 2015-12-28 LAB — CULTURE, BLOOD (ROUTINE X 2)

## 2015-12-30 LAB — CULTURE, BLOOD (ROUTINE X 2): Culture: NO GROWTH

## 2016-03-17 ENCOUNTER — Encounter: Payer: Self-pay | Admitting: Internal Medicine

## 2016-03-17 ENCOUNTER — Ambulatory Visit (INDEPENDENT_AMBULATORY_CARE_PROVIDER_SITE_OTHER): Payer: PPO | Admitting: Internal Medicine

## 2016-03-17 VITALS — BP 124/70 | HR 89 | Ht 69.75 in | Wt 160.0 lb

## 2016-03-17 DIAGNOSIS — J4530 Mild persistent asthma, uncomplicated: Secondary | ICD-10-CM | POA: Diagnosis not present

## 2016-03-17 NOTE — Patient Instructions (Signed)
No change in therapy  Please schedule a follow up visit in 6 months but call sooner if needed

## 2016-03-17 NOTE — Assessment & Plan Note (Signed)
-  Sinus CT 09/12/10 Previous bilateral nasoantral windows. Mild mucosal inflammation,  particularly notable in the maxillary, ethmoid and sphenoid  sinuses. No advanced sinusitis or free fluid. - Barium swallow nl 09/12/10 - PFTs 08/09/2011 FEV1  1.24 (50%) ratio 68 and no change p B2, DLCO 67 improves to 133 c/w minimal airflow obstruction - PFT's 10/01/2012 FEV2  1.63 (72%) ratio 76 and no change p B2, DLCO 76 improvede to 105  - 05/28/2015 p extensive coaching HFA effectiveness =   90% - PFT's  05/28/2015  FEV1 1.60  (71% ) ratio 75  p 6 % improvement from saba p nothing x 12 h prior to study with DLCO  65 % corrects to 93 % for alv volume    All goals of chronic asthma control met including optimal function and elimination of symptoms with minimal need for rescue therapy.  Contingencies discussed in full including contacting this office immediately if not controlling the symptoms using the rule of two's.     Each maintenance medication was reviewed in detail including most importantly the difference between maintenance and as needed and under what circumstances the prns are to be used.  Please see AVS for specific  Instructions which are unique to this visit and I personally typed out  which were reviewed in detail in writing with the patient and a copy provided.   

## 2016-03-17 NOTE — Progress Notes (Signed)
Subjective:     Patient ID: [name], female   DOB: 1945/08/04    MRN: [id number]    Brief patient profile:  36 yobf remote minimal smoker quit completely in the 1970's with dtc asthma since 2007 eval for the first time by the pulmonary service during admit [facility] in July 2012 with prominent upper airway "wheezing" with essentially nl pft's 10/01/2012     History of Present Illness  09/20/2010 f/u ov/Amado Andal cc better breathing since discharge on symbicort 160 2 bid and tapering prednisone to be off by 8/4.  No purulent sputum. No need for saba daytime.   rec  GERD diet Work on Doctor, hospital technique:  .Continue symbicort 160 Take 2 puffs first thing in am and then another 2 puffs about 12 hours later.  Protonix 40 mg Take 30-60 min before first meal of the day and Pepcid 20 mg on at bedtime Only use the tramadol if coughing Finish tapering prednisone off   02/08/2011 f/u ov/[name] did not stop singulair  cc  breathing doing well, no complaints - no need for daytime saba still on symbicort 160 2 bid and singulair, nasonex. No cough or urge to clear throat rec Ok try singulair off to see if it makes any difference > it didn't        Admit date: 03/31/2012  Discharge date: 04/05/2012  DISCHARGE DIAGNOSES:  Principal Problem:  Acute asthma exacerbation    05/20/2013 f/u ov/[name] re: chronic asthma  Chief Complaint  Patient presents with  . Asthma    Breathing is doing well. Reports slight wheeze at this time. Denies SOB, coughing, chest tightness.   no need for saba, Not limited by breathing from desired activities   rec Continue symbicort 160 Take 2 puffs first thing in am and then another 2 puffs about 12 hours later.  Only use your albuterol (ventolin) as a rescue medication  Admission date: 02/23/2015 Admitting Physician Terri Dickens, MD  Discharge Date: 02/27/2015   Admission Diagnosis Asthma exacerbation [J45.901]  Discharge Diagnosis Asthma exacerbation  [J45.901]   Principal Problem:  Asthma exacerbation Active Problems:  Polycythemia  Hyperglycemia   03/29/2015 ext post hosp f/u ov/transition of care/[name] re: s/p hosp for asthma now maint on symb 160/ singulair /ppi Chief Complaint  Patient presents with  . HFU    Pt states her breathing is much improved, almost back to her normal baseline. She uses albuterol inhaler 1 x per wk on average and has not needed neb.    did not follow any of the action plans previously discussed/ was not on gerd rx with flare but was still apparently taking symbicort 160 2bid  rec Plan A = Symbicort 80 Take 2 puffs first thing in am and then another 2 puffs about 12 hours later.  Work on inhaler technique:  relax and gently blow all the way out then take a nice smooth deep breath back in, triggering the inhaler at same time you start breathing in.  Hold for up to 5 seconds if you can. Blow out thru nose. Rinse and gargle with water when done Plan B = back -up Only use your albuterol (ventolin) as a rescue medication Plan C = Crisis Ok to use nebulizer if you must up to every 4 hours if needed  Pantoprazole (protonix) 40 mg   Take  30-60 min before first meal of the day and Pepcid (famotidine)  20 mg one @  bedtime until return to office - this  is the best way to tell whether stomach acid is contributing to your problem.   GERD diet  Prednisone 10 mg take  4 each am x 2 days,   2 each am x 2 days,  1 each am x 2 days and stop    05/13/15  Acute eval/ young rx tamiflu and depo 80/ did not take the pred rx > symb 160 2bid        03/17/2016  f/u ov/Daoud Lobue re:  Mild asthma with chronic pseudowheeze/ maint on symb 160 2bid and singulair  Chief Complaint  Patient presents with  . Follow-up    Breathing has been doing well. She c/o dryness in her nose and throat when she first wakes up. She is not coughing much. She only uses albuterol prior to walking up alot of stairs. She has not needed this in an  emergency.   Not limited by breathing from desired activities  - only sob with steps, very rare saba No premature awakening   No obvious daytime variabilty or assoc mucus plugs   cp or chest tightness, subjective wheeze overt sinus or hb symptoms. No unusual exp hx     Sleeping ok without nocturnal  or early am exacerbation  of respiratory  c/o's or need for noct saba. Also denies any obvious fluctuation of symptoms with weather or environmental changes or other aggravating or alleviating factors except as outlined above   ROS  The following are not active complaints unless bolded sore throat, dysphagia, dental problems, itching, sneezing,  nasal congestion or excess/ purulent secretions, ear ache,   fever, chills, sweats, unintended wt loss, pleuritic or exertional cp, hemoptysis,  orthopnea pnd or leg swelling, presyncope, palpitations, heartburn, abdominal pain, anorexia, nausea, vomiting, diarrhea  or change in bowel or urinary habits, change in stools or urine, dysuria,hematuria,  rash, arthralgias, visual complaints, headache, numbness weakness or ataxia or problems with walking or coordination,  change in mood/affect or memory.        Objective:   Physical Exam  amb bf nad / vital signs reviewed -  - Note on arrival 02 sats  99% on RA      wt   192  10/06/10 > 11/08/2010  192 > 02/08/2011 201> 05/09/2011  196> 08/09/2011  198 >11/13/2011 191 > 05/01/2012 188 > 186 08/20/2012 > 10/01/2012  190 > 05/20/2013 204 > 03/29/2015  206 > 05/28/2015  203 > 11/26/2015 171 > 12/16/2015  168 > 03/17/2016 160   HEENT: nl dentition, turbinates, and orophanx. Nl external ear canals without cough reflex   NECK :  without JVD/Nodes/TM/ nl carotid upstrokes bilaterally   LUNGS: no acc muscle use,  Trace end exp wheeze resolves with plm  CV:  RRR  no s3 or murmur or increase in P2, no edema   ABD:  soft and nontender with nl excursion in the supine position. No bruits or organomegaly, bowel sounds nl  MS:  warm  without deformities, calf tenderness, cyanosis or clubbing  SKIN: warm and dry without lesions    NEURO:  alert, approp, no deficits       I personally reviewed images and agree with radiology impression as follows:  CXR:   12/25/15 No active cardiopulmonary disease     Assessment:

## 2016-03-27 ENCOUNTER — Telehealth: Payer: Self-pay | Admitting: Internal Medicine

## 2016-03-27 MED ORDER — AZITHROMYCIN 250 MG PO TABS: ORAL_TABLET | ORAL | 0 refills | Status: DC

## 2016-03-27 NOTE — Telephone Encounter (Signed)
Spoke with pt. She is aware of MW's recommendation. Rx has been sent in. Nothing further was needed. 

## 2016-03-27 NOTE — Telephone Encounter (Signed)
Pt thinks she might have the flu- c/o increased fatigue, chills, prod cough with yellow mucus, low grade temp X3 days. Unsure if she has been exposed to anyone dx'ed with flu.   Denies sinus congestion.  Pt has been taking tylenol to help with symptoms, as well as maintenance asthma meds. Requesting further recs.  Pt uses [facility] on [address].   MW please advise on further recs.  Thanks!    Instructions  No change in therapy   Please schedule a follow up visit in 6 months but call sooner if needed

## 2016-03-27 NOTE — Telephone Encounter (Signed)
Zpak, to UC if any worse and can't fit her in here  - a bit late to start tamiflu

## 2016-03-28 ENCOUNTER — Emergency Department (HOSPITAL_COMMUNITY): Payer: PPO

## 2016-03-28 ENCOUNTER — Encounter (HOSPITAL_COMMUNITY): Payer: Self-pay | Admitting: Emergency Medicine

## 2016-03-28 ENCOUNTER — Inpatient Hospital Stay (HOSPITAL_COMMUNITY)
Admission: EM | Admit: 2016-03-28 | Discharge: 2016-03-31 | DRG: 194 | Disposition: A | Discharge status: Home / Self Care | Payer: PPO | Attending: Internal Medicine | Admitting: Internal Medicine

## 2016-03-28 DIAGNOSIS — Z7951 Long term (current) use of inhaled steroids: Secondary | ICD-10-CM | POA: Diagnosis not present

## 2016-03-28 DIAGNOSIS — J1100 Influenza due to unidentified influenza virus with unspecified type of pneumonia: Secondary | ICD-10-CM | POA: Diagnosis present

## 2016-03-28 DIAGNOSIS — J111 Influenza due to unidentified influenza virus with other respiratory manifestations: Secondary | ICD-10-CM

## 2016-03-28 DIAGNOSIS — Z79899 Other long term (current) drug therapy: Secondary | ICD-10-CM | POA: Diagnosis not present

## 2016-03-28 DIAGNOSIS — J189 Pneumonia, unspecified organism: Principal | ICD-10-CM | POA: Diagnosis present

## 2016-03-28 DIAGNOSIS — J45901 Unspecified asthma with (acute) exacerbation: Secondary | ICD-10-CM | POA: Diagnosis not present

## 2016-03-28 DIAGNOSIS — Z825 Family history of asthma and other chronic lower respiratory diseases: Secondary | ICD-10-CM | POA: Diagnosis not present

## 2016-03-28 DIAGNOSIS — K219 Gastro-esophageal reflux disease without esophagitis: Secondary | ICD-10-CM | POA: Diagnosis not present

## 2016-03-28 DIAGNOSIS — R000 Tachycardia, unspecified: Secondary | ICD-10-CM | POA: Diagnosis present

## 2016-03-28 DIAGNOSIS — R9431 Abnormal electrocardiogram [ECG] [EKG]: Secondary | ICD-10-CM | POA: Diagnosis present

## 2016-03-28 DIAGNOSIS — I509 Heart failure, unspecified: Secondary | ICD-10-CM | POA: Diagnosis not present

## 2016-03-28 DIAGNOSIS — E876 Hypokalemia: Secondary | ICD-10-CM | POA: Diagnosis not present

## 2016-03-28 DIAGNOSIS — R197 Diarrhea, unspecified: Secondary | ICD-10-CM | POA: Diagnosis not present

## 2016-03-28 DIAGNOSIS — R0602 Shortness of breath: Secondary | ICD-10-CM | POA: Diagnosis not present

## 2016-03-28 DIAGNOSIS — R05 Cough: Secondary | ICD-10-CM | POA: Diagnosis not present

## 2016-03-28 DIAGNOSIS — Z886 Allergy status to analgesic agent status: Secondary | ICD-10-CM

## 2016-03-28 DIAGNOSIS — E0590 Thyrotoxicosis, unspecified without thyrotoxic crisis or storm: Secondary | ICD-10-CM | POA: Diagnosis not present

## 2016-03-28 DIAGNOSIS — R69 Illness, unspecified: Secondary | ICD-10-CM

## 2016-03-28 DIAGNOSIS — J4541 Moderate persistent asthma with (acute) exacerbation: Secondary | ICD-10-CM | POA: Diagnosis not present

## 2016-03-28 HISTORY — DX: Pneumonia, unspecified organism: J18.9

## 2016-03-28 LAB — URINALYSIS, ROUTINE W REFLEX MICROSCOPIC
BILIRUBIN URINE: NEGATIVE
Glucose, UA: NEGATIVE mg/dL
Ketones, ur: 20 mg/dL — AB
LEUKOCYTES UA: NEGATIVE
Nitrite: NEGATIVE
PH: 5 (ref 5.0–8.0)
Protein, ur: 100 mg/dL — AB
SPECIFIC GRAVITY, URINE: 1.024 (ref 1.005–1.030)

## 2016-03-28 LAB — I-STAT CHEM 8, ED
BUN: 16 mg/dL (ref 6–20)
CALCIUM ION: 1.2 mmol/L (ref 1.15–1.40)
Chloride: 103 mmol/L (ref 101–111)
Creatinine, Ser: 0.7 mg/dL (ref 0.44–1.00)
Glucose, Bld: 124 mg/dL — ABNORMAL HIGH (ref 65–99)
HEMATOCRIT: 38 % (ref 36.0–46.0)
HEMOGLOBIN: 12.9 g/dL (ref 12.0–15.0)
Potassium: 3.8 mmol/L (ref 3.5–5.1)
SODIUM: 141 mmol/L (ref 135–145)
TCO2: 25 mmol/L (ref 0–100)

## 2016-03-28 LAB — COMPREHENSIVE METABOLIC PANEL
ALBUMIN: 3.7 g/dL (ref 3.5–5.0)
ALT: 23 U/L (ref 14–54)
ANION GAP: 14 (ref 5–15)
AST: 29 U/L (ref 15–41)
Alkaline Phosphatase: 128 U/L — ABNORMAL HIGH (ref 38–126)
BUN: 15 mg/dL (ref 6–20)
CHLORIDE: 103 mmol/L (ref 101–111)
CO2: 22 mmol/L (ref 22–32)
Calcium: 9.7 mg/dL (ref 8.9–10.3)
Creatinine, Ser: 0.81 mg/dL (ref 0.44–1.00)
GFR calc Af Amer: >60 mL/min (ref >60)
GFR calc non Af Amer: >60 mL/min (ref >60)
GLUCOSE: 120 mg/dL — AB (ref 65–99)
POTASSIUM: 3.8 mmol/L (ref 3.5–5.1)
Sodium: 139 mmol/L (ref 135–145)
TOTAL PROTEIN: 7.2 g/dL (ref 6.5–8.1)
Total Bilirubin: 0.7 mg/dL (ref 0.3–1.2)

## 2016-03-28 LAB — CBC WITH DIFFERENTIAL/PLATELET
BASOS ABS: 0.1 10*3/uL (ref 0.0–0.1)
BASOS PCT: 1 %
EOS ABS: 0 10*3/uL (ref 0.0–0.7)
EOS PCT: 0 %
HCT: 35.7 % — ABNORMAL LOW (ref 36.0–46.0)
Hemoglobin: 11.3 g/dL — ABNORMAL LOW (ref 12.0–15.0)
LYMPHS PCT: 32 %
Lymphs Abs: 2 10*3/uL (ref 0.7–4.0)
MCH: 22.7 pg — ABNORMAL LOW (ref 26.0–34.0)
MCHC: 31.7 g/dL (ref 30.0–36.0)
MCV: 71.8 fL — AB (ref 78.0–100.0)
MONO ABS: 0.7 10*3/uL (ref 0.1–1.0)
Monocytes Relative: 11 %
Neutro Abs: 3.5 10*3/uL (ref 1.7–7.7)
Neutrophils Relative %: 56 %
PLATELETS: 207 10*3/uL (ref 150–400)
RBC: 4.97 MIL/uL (ref 3.87–5.11)
RDW: 16.2 % — AB (ref 11.5–15.5)
WBC: 6.2 10*3/uL (ref 4.0–10.5)

## 2016-03-28 LAB — INFLUENZA PANEL BY PCR (TYPE A & B)
INFLAPCR: NEGATIVE
INFLBPCR: NEGATIVE

## 2016-03-28 LAB — LIPASE, BLOOD: LIPASE: 20 U/L (ref 11–51)

## 2016-03-28 LAB — I-STAT CG4 LACTIC ACID, ED: Lactic Acid, Venous: 1.18 mmol/L (ref 0.5–1.9)

## 2016-03-28 MED ORDER — ENOXAPARIN SODIUM 40 MG/0.4ML ~~LOC~~ SOLN
40.0000 mg | SUBCUTANEOUS | Status: DC
  Administered 2016-03-28 – 2016-03-30 (×3): 40 mg via SUBCUTANEOUS
  Filled 2016-03-28 (×4): qty 0.4

## 2016-03-28 MED ORDER — DEXTROSE 5 % IV SOLN
1.0000 g | Freq: Once | INTRAVENOUS | Status: AC
  Administered 2016-03-28: 1 g via INTRAVENOUS
  Filled 2016-03-28: qty 10

## 2016-03-28 MED ORDER — SODIUM CHLORIDE 0.9 % IV SOLN
INTRAVENOUS | Status: DC
  Administered 2016-03-28 (×2): via INTRAVENOUS

## 2016-03-28 MED ORDER — AZITHROMYCIN 500 MG PO TABS
500.0000 mg | ORAL_TABLET | ORAL | Status: DC
  Administered 2016-03-29 – 2016-03-30 (×2): 500 mg via ORAL
  Filled 2016-03-28 (×2): qty 1

## 2016-03-28 MED ORDER — IPRATROPIUM-ALBUTEROL 0.5-2.5 (3) MG/3ML IN SOLN
RESPIRATORY_TRACT | Status: AC
  Filled 2016-03-28: qty 3

## 2016-03-28 MED ORDER — DEXTROSE 5 % IV SOLN
500.0000 mg | Freq: Once | INTRAVENOUS | Status: AC
  Administered 2016-03-28: 500 mg via INTRAVENOUS
  Filled 2016-03-28: qty 500

## 2016-03-28 MED ORDER — IPRATROPIUM BROMIDE 0.02 % IN SOLN
0.5000 mg | Freq: Four times a day (QID) | RESPIRATORY_TRACT | Status: DC
  Administered 2016-03-29 – 2016-03-31 (×10): 0.5 mg via RESPIRATORY_TRACT
  Filled 2016-03-28 (×11): qty 2.5

## 2016-03-28 MED ORDER — SODIUM CHLORIDE 0.9 % IV BOLUS (SEPSIS)
1000.0000 mL | Freq: Once | INTRAVENOUS | Status: AC
  Administered 2016-03-28: 1000 mL via INTRAVENOUS

## 2016-03-28 MED ORDER — MONTELUKAST SODIUM 10 MG PO TABS
10.0000 mg | ORAL_TABLET | Freq: Every day | ORAL | Status: DC
  Administered 2016-03-29 – 2016-03-30 (×2): 10 mg via ORAL
  Filled 2016-03-28 (×2): qty 1

## 2016-03-28 MED ORDER — IPRATROPIUM-ALBUTEROL 0.5-2.5 (3) MG/3ML IN SOLN
3.0000 mL | Freq: Once | RESPIRATORY_TRACT | Status: AC
  Administered 2016-03-28: 3 mL via RESPIRATORY_TRACT

## 2016-03-28 MED ORDER — ALBUTEROL (5 MG/ML) CONTINUOUS INHALATION SOLN
20.0000 mg/h | INHALATION_SOLUTION | Freq: Once | RESPIRATORY_TRACT | Status: AC
  Administered 2016-03-28: 20 mg/h via RESPIRATORY_TRACT
  Filled 2016-03-28 (×2): qty 20

## 2016-03-28 MED ORDER — ONDANSETRON HCL 4 MG/2ML IJ SOLN
4.0000 mg | Freq: Once | INTRAMUSCULAR | Status: AC
  Administered 2016-03-28: 4 mg via INTRAVENOUS
  Filled 2016-03-28: qty 2

## 2016-03-28 MED ORDER — ACETAMINOPHEN 325 MG PO TABS
650.0000 mg | ORAL_TABLET | Freq: Once | ORAL | Status: AC
  Administered 2016-03-28: 650 mg via ORAL
  Filled 2016-03-28: qty 2

## 2016-03-28 MED ORDER — MOMETASONE FURO-FORMOTEROL FUM 200-5 MCG/ACT IN AERO
2.0000 | INHALATION_SPRAY | Freq: Two times a day (BID) | RESPIRATORY_TRACT | Status: DC
  Administered 2016-03-29 – 2016-03-31 (×5): 2 via RESPIRATORY_TRACT
  Filled 2016-03-28: qty 8.8

## 2016-03-28 MED ORDER — DEXTROSE 5 % IV SOLN
1.0000 g | INTRAVENOUS | Status: DC
  Administered 2016-03-29 – 2016-03-30 (×2): 1 g via INTRAVENOUS
  Filled 2016-03-28 (×3): qty 10

## 2016-03-28 MED ORDER — LEVALBUTEROL HCL 0.63 MG/3ML IN NEBU
0.6300 mg | INHALATION_SOLUTION | Freq: Four times a day (QID) | RESPIRATORY_TRACT | Status: DC
  Administered 2016-03-29 – 2016-03-31 (×10): 0.63 mg via RESPIRATORY_TRACT
  Filled 2016-03-28 (×11): qty 3

## 2016-03-28 MED ORDER — FLUTICASONE PROPIONATE 50 MCG/ACT NA SUSP
2.0000 | Freq: Every day | NASAL | Status: DC | PRN
  Filled 2016-03-28: qty 16

## 2016-03-28 MED ORDER — PANTOPRAZOLE SODIUM 40 MG PO TBEC
40.0000 mg | DELAYED_RELEASE_TABLET | Freq: Every day | ORAL | Status: DC
  Administered 2016-03-29 – 2016-03-31 (×3): 40 mg via ORAL
  Filled 2016-03-28 (×3): qty 1

## 2016-03-28 MED ORDER — ALBUTEROL SULFATE (2.5 MG/3ML) 0.083% IN NEBU
2.5000 mg | INHALATION_SOLUTION | RESPIRATORY_TRACT | Status: DC | PRN
  Administered 2016-03-28: 2.5 mg via RESPIRATORY_TRACT
  Filled 2016-03-28: qty 3

## 2016-03-28 MED ORDER — FAMOTIDINE 20 MG PO TABS
20.0000 mg | ORAL_TABLET | Freq: Every day | ORAL | Status: DC
  Administered 2016-03-28 – 2016-03-30 (×3): 20 mg via ORAL
  Filled 2016-03-28 (×3): qty 1

## 2016-03-28 MED ORDER — OSELTAMIVIR PHOSPHATE 75 MG PO CAPS
75.0000 mg | ORAL_CAPSULE | Freq: Once | ORAL | Status: AC
  Administered 2016-03-28: 75 mg via ORAL
  Filled 2016-03-28: qty 1

## 2016-03-28 NOTE — Progress Notes (Signed)
Patient arrived to 3E28 from [facility]. A&O x 4. No complaints of pain. ST on telemetry. VSS. No skin break down. Patient oriented to room.

## 2016-03-28 NOTE — ED Notes (Signed)
Pt in xray

## 2016-03-28 NOTE — ED Triage Notes (Signed)
Pt here for abd pain and cough with body aches; pt sent from [facility]; wheezing noted

## 2016-03-28 NOTE — ED Provider Notes (Signed)
[facility] DEPT Provider Note  CSN: [id number] Arrival date & time: 03/28/16  1236  History   Chief Complaint Chief Complaint  Patient presents with  . Generalized Body Aches  . Abdominal Pain  . Diarrhea   HPI [name] is a 71 y.o. female.  The history is provided by the patient and medical records. No language interpreter was used.  Illness  This is a new problem. The current episode started more than 2 days ago. The problem occurs constantly. The problem has been gradually worsening. Associated symptoms include abdominal pain (cramping), headaches and shortness of breath. Pertinent negatives include no chest pain. The symptoms are aggravated by coughing and eating. Nothing relieves the symptoms.   Past Medical History:  Diagnosis Date  . Asthma   . GERD (gastroesophageal reflux disease)   . Heart murmur    Patient Active Problem List   Diagnosis Date Noted  . CAP (community acquired pneumonia) 03/28/2016  . Asthma exacerbation 02/23/2015  . Polycythemia 02/23/2015  . Hyperglycemia 02/23/2015  . Asthma, chronic 05/04/2012  . Acute asthma exacerbation 03/31/2012  . Hypokalemia 03/31/2012   Past Surgical History:  Procedure Laterality Date  . CATARACT EXTRACTION W/ INTRAOCULAR LENS  IMPLANT, BILATERAL  2011 &2009  . TONSILLECTOMY    . TUBAL LIGATION     OB History    No data available     Home Medications    Prior to Admission medications   Medication Sig Start Date End Date Taking? Authorizing Provider  acetaminophen (TYLENOL) 325 MG tablet Take 325-650 mg by mouth every 6 (six) hours as needed for mild pain or headache.   Yes Historical Provider, MD  albuterol (PROVENTIL HFA;VENTOLIN HFA) 108 (90 Base) MCG/ACT inhaler Inhale 2 puffs into the lungs every 6 (six) hours as needed for wheezing. 02/27/15  Yes Thurnell Lose, MD  albuterol (PROVENTIL) (2.5 MG/3ML) 0.083% nebulizer solution Take 3 mLs (2.5 mg total) by nebulization every 6 (six) hours as needed.  asthma Patient taking differently: Take 2.5 mg by nebulization every 6 (six) hours as needed for wheezing.  04/05/12  Yes Bonnielee Haff, MD  azithromycin (ZITHROMAX Z-PAK) 250 MG tablet Take 2 tablets (500 mg) on  Day 1,  followed by 1 tablet (250 mg) once daily on Days 2 through 5. 03/27/16 04/01/16 Yes Tanda Rockers, MD  budesonide-formoterol [facility]) 160-4.5 MCG/ACT inhaler Inhale 2 puffs into the lungs 2 (two) times daily. 12/16/15  Yes Tanda Rockers, MD  famotidine (PEPCID) 20 MG tablet One at bedtime Patient taking differently: Take 20 mg by mouth at bedtime. One at bedtime 11/26/15  Yes Tanda Rockers, MD  fluticasone [facility]) 50 MCG/ACT nasal spray Place 2 sprays into the nose daily. Patient taking differently: Place 2 sprays into both nostrils daily as needed for allergies.  11/28/11  Yes Tanda Rockers, MD  montelukast (SINGULAIR) 10 MG tablet Take 1 tablet (10 mg total) by mouth at bedtime. 02/27/15  Yes Thurnell Lose, MD  pantoprazole (PROTONIX) 40 MG tablet Take 1 tablet (40 mg total) by mouth daily before breakfast. 12/16/15  Yes Tanda Rockers, MD   Family History Family History  Problem Relation Age of Onset  . Asthma Sister   . Emphysema Father     was a smoker  . Hypertension Mother   . Hypertension Sister    Social History Social History  Substance Use Topics  . Smoking status: Never Smoker  . Smokeless tobacco: Never Used  . Alcohol use No  Allergies   Aspirin  Review of Systems Review of Systems  Constitutional: Positive for appetite change (decreased), chills and fever.  HENT: Positive for congestion.   Respiratory: Positive for cough, shortness of breath and wheezing.   Cardiovascular: Negative for chest pain, palpitations and leg swelling.  Gastrointestinal: Positive for abdominal pain (cramping), diarrhea, nausea and vomiting.  Neurological: Positive for headaches.  All other systems reviewed and are negative.  Physical Exam Updated Vital Signs BP  139/63   Pulse (!) 155   Temp 99.2 F (37.3 C) (Oral)   Resp 24   SpO2 98%   Physical Exam  Constitutional: She is oriented to person, place, and time. No distress.  Thin elderly African-American female  HENT:  Head: Normocephalic and atraumatic.  Eyes: EOM are normal. Pupils are equal, round, and reactive to light.  Neck: Normal range of motion. Neck supple.  Cardiovascular: Regular rhythm and normal heart sounds.  Tachycardia present.   Pulmonary/Chest: She has wheezes.  Tachypnea to mid 30s, inspiratory and expiratory wheezes throughout all lung fields, accessory muscle use, maintaining saturations in mid 90s on room air  Abdominal: Soft. Bowel sounds are normal. She exhibits no distension. There is no tenderness.  Musculoskeletal: Normal range of motion.  Neurological: She is alert and oriented to person, place, and time.  Skin: Skin is warm and dry. Capillary refill takes less than 2 seconds. She is not diaphoretic.  Nursing note and vitals reviewed.  ED Treatments / Results  Labs (all labs ordered are listed, but only abnormal results are displayed) Labs Reviewed  COMPREHENSIVE METABOLIC PANEL - Abnormal; Notable for the following:       Result Value   Glucose, Bld 120 (*)    Alkaline Phosphatase 128 (*)    All other components within normal limits  URINALYSIS, ROUTINE W REFLEX MICROSCOPIC - Abnormal; Notable for the following:    APPearance HAZY (*)    Hgb urine dipstick SMALL (*)    Ketones, ur 20 (*)    Protein, ur 100 (*)    Bacteria, UA RARE (*)    Squamous Epithelial / LPF 0-5 (*)    All other components within normal limits  CBC WITH DIFFERENTIAL/PLATELET - Abnormal; Notable for the following:    Hemoglobin 11.3 (*)    HCT 35.7 (*)    MCV 71.8 (*)    MCH 22.7 (*)    RDW 16.2 (*)    All other components within normal limits  I-STAT CHEM 8, ED - Abnormal; Notable for the following:    Glucose, Bld 124 (*)    All other components within normal limits    CULTURE, BLOOD (ROUTINE X 2)  CULTURE, BLOOD (ROUTINE X 2)  LIPASE, BLOOD  INFLUENZA PANEL BY PCR (TYPE A & B)  I-STAT CG4 LACTIC ACID, ED  I-STAT CG4 LACTIC ACID, ED   EKG  EKG Interpretation  Date/Time:  Tuesday March 28 2016 16:01:14 EST Ventricular Rate:  130 PR Interval:    QRS Duration: 77 QT Interval:  328 QTC Calculation: 483 R Axis:   84 Text Interpretation:  Sinus tachycardia LAE, consider biatrial enlargement Borderline right axis deviation Left ventricular hypertrophy ST depr, consider ischemia, inferior leads likely rate related depressions Confirmed by [facility] MD, JASON [phone]) on 03/28/2016 4:13:13 PM      Radiology Dg Chest 2 View  Result Date: 03/28/2016 CLINICAL DATA:  Shortness of breath and fever EXAM: CHEST  2 VIEW COMPARISON:  December 25, 2015 FINDINGS: There are apparent nipple  shadows bilaterally. There is rather subtle patchy infiltrate in the right upper lobe. A smaller focus of increased opacity is seen in the right middle lobe superiorly. Lungs elsewhere are clear. Heart size and pulmonary vascularity are normal. No adenopathy. There is atherosclerotic calcification in the aorta. No adenopathy. No bone lesions. IMPRESSION: Areas of patchy infiltrate on the right consistent with pneumonia. Lungs elsewhere clear. Aortic atherosclerosis. Followup PA and lateral chest radiographs recommended in 3-4 weeks following trial of antibiotic therapy to ensure resolution and exclude underlying malignancy. Note that the area of opacity in superior right middle lobe appears subtly nodular. Short interval follow-up advised in particular given this finding. Electronically Signed   By: Lowella Grip III M.D.   On: 03/28/2016 15:35    Procedures Procedures (including critical care time)  Medications Ordered in ED Medications  ipratropium-albuterol (DUONEB) 0.5-2.5 (3) MG/3ML nebulizer solution (not administered)  sodium chloride 0.9 % bolus 1,000 mL (not administered)   ipratropium-albuterol (DUONEB) 0.5-2.5 (3) MG/3ML nebulizer solution 3 mL (3 mLs Nebulization Given 03/28/16 1252)  oseltamivir (TAMIFLU) capsule 75 mg (75 mg Oral Given 03/28/16 1613)  albuterol (PROVENTIL,VENTOLIN) solution continuous neb (20 mg/hr Nebulization Given 03/28/16 1625)  azithromycin (ZITHROMAX) 500 mg in dextrose 5 % 250 mL IVPB (0 mg Intravenous Stopped 03/28/16 1717)  cefTRIAXone (ROCEPHIN) 1 g in dextrose 5 % 50 mL IVPB (0 g Intravenous Stopped 03/28/16 1645)  ondansetron (ZOFRAN) injection 4 mg (4 mg Intravenous Given 03/28/16 1609)  sodium chloride 0.9 % bolus 1,000 mL (0 mLs Intravenous Stopped 03/28/16 1721)  acetaminophen (TYLENOL) tablet 650 mg (650 mg Oral Given 03/28/16 1619)   Initial Impression / Assessment and Plan / ED Course  I have reviewed the triage vital signs and the nursing notes.  71 y.o. female with above stated PMHx, HPI, and physical. PMHx of asthma. Pulmonologist started Pt on Azithromycin yesterday. Took nebulizer this AM.  Concern for influenza versus pneumonia versus viral illness. Blood cultures obtained. Patient given IV fluids. Given age and associated asthma history will treat patient empirically with Tamiflu. Chest x-ray concerning for pneumonia - patient given IV Azithromycin and Rocephin to cover for community acquired pneumonia.  Laboratory and imaging results were personally reviewed by myself and used in the medical decision making of this patient's treatment and disposition.  Pt admitted to medicine for further evaluation and management of CAP & influenza in setting of elderly asthmatic. Pt understands and agrees with the plan and has no further questions or concerns.   Pt care discussed with and followed by my attending, Dr. Merrily Pew  Mayer Camel, MD Pager [phone]  Final Clinical Impressions(s) / ED Diagnoses   Final diagnoses:  Moderate persistent asthma with exacerbation  Influenza-like illness  Community acquired pneumonia   New  Prescriptions New Prescriptions   No medications on file     Mayer Camel, MD 03/28/16 [place], MD 03/28/16 1946

## 2016-03-28 NOTE — ED Notes (Signed)
Spoke with patient. Patient had chest xray completed and urgent care prior to arrival.  Spoke with Doctor if patient could have another chest x ray after speaking patient patient felt xray was not completed correctly.  Doctor ordered to have x ray completed.

## 2016-03-28 NOTE — H&P (Signed)
History and Physical    [name] [id number] DOB: Nov 07, 1945 DOA: 03/28/2016  PCP: No PCP Per Patient - shopping for a new one now Consultants:  [facility] Patient coming from: home - lives with son and daughter-in-law; [name]: son, [phone]  Chief Complaint: SOB  HPI: [name] is a 71 y.o. female with medical history significant of asthma and GERD presenting because she couldn't breathe, chills, upset stomach, diarrhea, aches.  Symptoms started Saturday, felt better Sunday, recurred Monday.  +fever to 101.  +cough - productive of goldish thick sputum. Today, diarrhea twice. Yesterday with vomiting.  H/o asthma, no h/o intubations.  No sick contacts.  Called Dr. Gustavus Bryant office and they called her in Azithromycin, took 2 tablets yesterday and 1 today. She was told if she felt worse tocomein to urgent care; she did and they sent her in to the ER.   ED Course: Per Dr. Dayna Barker: 71 year old female here with a few days of worsening body aches and dyspnea. Seen by up on all just yesterday and started on azithromycin and is only taking one dose.  On my exam patient is in respiratory distress. She has retractions, accessory muscle usage, tachypnea and tachycardia. Her skin is dry. Her breath sounds are decreased with significant wheezing and crackles. Heart is tachycardic otherwise no murmurs rubs or gallops are appreciated. No lower extremity swelling or rashes.  Chest x-ray is consistent with a right-sided pneumonia. Also suspicious she may have some flu or flulike symptoms so we'll check for that as well. We'll give Rocephin and azithromycin for the acquired pneumonia, fluids, Tylenol, continuous albuterol and they will re-evaluate for level of care needed in the hospital.    Review of Systems: As per HPI; otherwise 10 point review of systems reviewed and negative.   Ambulatory Status:  ambulates without assistance  Past Medical History:  Diagnosis Date  . Asthma   . GERD  (gastroesophageal reflux disease)   . Heart murmur     Past Surgical History:  Procedure Laterality Date  . CATARACT EXTRACTION W/ INTRAOCULAR LENS  IMPLANT, BILATERAL  2011 &2009  . TONSILLECTOMY    . TUBAL LIGATION      Social History   Social History  . Marital status: Widowed    Spouse name: N/A  . Number of children: 1  . Years of education: N/A   Occupational History  . Coun[employer]   Social History Main Topics  . Smoking status: Never Smoker  . Smokeless tobacco: Never Used  . Alcohol use Yes     Comment: maybe 1-2 times annually  . Drug use: No  . Sexual activity: Yes    Birth control/ protection: Other-see comments   Other Topics Concern  . Not on file   Social History Narrative  . No narrative on file    Allergies  Allergen Reactions  . Aspirin Shortness Of Breath    Family History  Problem Relation Age of Onset  . Asthma Sister   . Emphysema Father     was a smoker  . Hypertension Mother   . Hypertension Sister     Prior to Admission medications   Medication Sig Start Date End Date Taking? Authorizing Provider  acetaminophen (TYLENOL) 325 MG tablet Take 325-650 mg by mouth every 6 (six) hours as needed for mild pain or headache.   Yes Historical Provider, MD  albuterol (PROVENTIL HFA;VENTOLIN HFA) 108 (90 Base) MCG/ACT inhaler Inhale 2 puffs into the lungs every 6 (six) hours as needed  for wheezing. 02/27/15  Yes Thurnell Lose, MD  albuterol (PROVENTIL) (2.5 MG/3ML) 0.083% nebulizer solution Take 3 mLs (2.5 mg total) by nebulization every 6 (six) hours as needed. asthma Patient taking differently: Take 2.5 mg by nebulization every 6 (six) hours as needed for wheezing.  04/05/12  Yes Bonnielee Haff, MD  azithromycin (ZITHROMAX Z-PAK) 250 MG tablet Take 2 tablets (500 mg) on  Day 1,  followed by 1 tablet (250 mg) once daily on Days 2 through 5. 03/27/16 04/01/16 Yes Tanda Rockers, MD  budesonide-formoterol [facility]) 160-4.5 MCG/ACT  inhaler Inhale 2 puffs into the lungs 2 (two) times daily. 12/16/15  Yes Tanda Rockers, MD  famotidine (PEPCID) 20 MG tablet One at bedtime Patient taking differently: Take 20 mg by mouth at bedtime. One at bedtime 11/26/15  Yes Tanda Rockers, MD  fluticasone [facility]) 50 MCG/ACT nasal spray Place 2 sprays into the nose daily. Patient taking differently: Place 2 sprays into both nostrils daily as needed for allergies.  11/28/11  Yes Tanda Rockers, MD  montelukast (SINGULAIR) 10 MG tablet Take 1 tablet (10 mg total) by mouth at bedtime. 02/27/15  Yes Thurnell Lose, MD  pantoprazole (PROTONIX) 40 MG tablet Take 1 tablet (40 mg total) by mouth daily before breakfast. 12/16/15  Yes Tanda Rockers, MD    Physical Exam: Vitals:   03/28/16 2201 03/29/16 0046 03/29/16 0100 03/29/16 0106  BP:  106/65    Pulse:      Resp:  16    Temp:  (!) 102.2 F (39 C) (!) 102.9 F (39.4 C)   TempSrc:  Oral Oral   SpO2: 99% 100%  100%  Weight:      Height:         General:   Appears moderately ill appearing but is in NAD Eyes:  PERRL, EOMI, normal lids, iris ENT:  grossly normal hearing, lips & tongue, mmm Neck:  no LAD, masses or thyromegaly Cardiovascular:  Tachycardia, [id number] systolic murmur, no r/g. No LE edema.  Respiratory:  Diffuse expiratory wheezing, moderately poor air movement Abdomen:  soft, ntnd, NABS Skin:  no rash or induration seen on limited exam Musculoskeletal:  grossly normal tone BUE/BLE, good ROM, no bony abnormality Psychiatric:  grossly normal mood and affect, speech fluent and appropriate, AOx3 Neurologic:  CN 2-12 grossly intact, moves all extremities in coordinated fashion, sensation intact  Labs on Admission: I have personally reviewed following labs and imaging studies  CBC:  Recent Labs Lab 03/28/16 1549 03/28/16 1557  WBC 6.2  --   NEUTROABS 3.5  --   HGB 11.3* 12.9  HCT 35.7* 38.0  MCV 71.8*  --   PLT 207  --    Basic Metabolic Panel:  Recent Labs Lab  03/28/16 1542 03/28/16 1557  NA 139 141  K 3.8 3.8  CL 103 103  CO2 22  --   GLUCOSE 120* 124*  BUN 15 16  CREATININE 0.81 0.70  CALCIUM 9.7  --    GFR: Estimated Creatinine Clearance: 68.1 mL/min (by C-G formula based on SCr of 0.7 mg/dL). Liver Function Tests:  Recent Labs Lab 03/28/16 1542  AST 29  ALT 23  ALKPHOS 128*  BILITOT 0.7  PROT 7.2  ALBUMIN 3.7    Recent Labs Lab 03/28/16 1542  LIPASE 20   No results for input(s): AMMONIA in the last 168 hours. Coagulation Profile: No results for input(s): INR, PROTIME in the last 168 hours. Cardiac Enzymes: No results  for input(s): CKTOTAL, CKMB, CKMBINDEX, TROPONINI in the last 168 hours. BNP (last 3 results) No results for input(s): PROBNP in the last 8760 hours. HbA1C: No results for input(s): HGBA1C in the last 72 hours. CBG: No results for input(s): GLUCAP in the last 168 hours. Lipid Profile: No results for input(s): CHOL, HDL, LDLCALC, TRIG, CHOLHDL, LDLDIRECT in the last 72 hours. Thyroid Function Tests: No results for input(s): TSH, T4TOTAL, FREET4, T3FREE, THYROIDAB in the last 72 hours. Anemia Panel: No results for input(s): VITAMINB12, FOLATE, FERRITIN, TIBC, IRON, RETICCTPCT in the last 72 hours. Urine analysis:    Component Value Date/Time   COLORURINE YELLOW 03/28/2016 1810   APPEARANCEUR HAZY (A) 03/28/2016 1810   LABSPEC 1.024 03/28/2016 1810   PHURINE 5.0 03/28/2016 1810   GLUCOSEU NEGATIVE 03/28/2016 1810   HGBUR SMALL (A) 03/28/2016 1810   BILIRUBINUR NEGATIVE 03/28/2016 1810   KETONESUR 20 (A) 03/28/2016 1810   PROTEINUR 100 (A) 03/28/2016 1810   UROBILINOGEN 0.2 09/05/2010 1552   NITRITE NEGATIVE 03/28/2016 1810   LEUKOCYTESUR NEGATIVE 03/28/2016 1810    Creatinine Clearance: Estimated Creatinine Clearance: 68.1 mL/min (by C-G formula based on SCr of 0.7 mg/dL).  Sepsis Labs: @LABRCNTIP (procalcitonin:4,lacticidven:4) ) Recent Results (from the past 240 hour(s))  Blood culture  (routine x 2)     Status: None (Preliminary result)   Collection Time: 03/28/16  4:00 PM  Result Value Ref Range Status   Specimen Description BLOOD LEFT HAND  Final   Special Requests BOTTLES DRAWN AEROBIC AND ANAEROBIC 5CC  Final   Culture PENDING  Incomplete   Report Status PENDING  Incomplete  Blood culture (routine x 2)     Status: None (Preliminary result)   Collection Time: 03/28/16  4:05 PM  Result Value Ref Range Status   Specimen Description BLOOD RIGHT HAND  Final   Special Requests BOTTLES DRAWN AEROBIC AND ANAEROBIC 5CC  Final   Culture PENDING  Incomplete   Report Status PENDING  Incomplete     Radiological Exams on Admission: Dg Chest 2 View  Result Date: 03/28/2016 CLINICAL DATA:  Shortness of breath and fever EXAM: CHEST  2 VIEW COMPARISON:  December 25, 2015 FINDINGS: There are apparent nipple shadows bilaterally. There is rather subtle patchy infiltrate in the right upper lobe. A smaller focus of increased opacity is seen in the right middle lobe superiorly. Lungs elsewhere are clear. Heart size and pulmonary vascularity are normal. No adenopathy. There is atherosclerotic calcification in the aorta. No adenopathy. No bone lesions. IMPRESSION: Areas of patchy infiltrate on the right consistent with pneumonia. Lungs elsewhere clear. Aortic atherosclerosis. Followup PA and lateral chest radiographs recommended in 3-4 weeks following trial of antibiotic therapy to ensure resolution and exclude underlying malignancy. Note that the area of opacity in superior right middle lobe appears subtly nodular. Short interval follow-up advised in particular given this finding. Electronically Signed   By: Lowella Grip III M.D.   On: 03/28/2016 15:35    EKG: Independently reviewed.  Sinus tachycardia with rate 130; LVH, nonspecific ST changes vs. ST depression in lateral/inferior leads, likely rate-related, with no evidence of acute ischemia  Assessment/Plan Principal Problem:    Influenza with pneumonia Active Problems:   Acute asthma exacerbation   CAP (community acquired pneumonia)   Nonspecific abnormal electrocardiogram (ECG) (EKG)   Influenza with CAP -Given productive cough, fever to 101, and infiltrate in right lung on chest x-ray , most likely community-acquired pneumonia.  - Other etiologies include aspiration (but has normal mental status)  versus URI (most likely viral). -Influenza treated empirically with Tamiflu. -Will start Azithromycin 500 mg daily AND Rocephin. -NS @ 75cc/hr -Fever control -Repeat CBC in am -Sputum cultures -Blood cultures -Strep pneumo testing -albuterol PRN -Lactate 1.18  Abnormal EKG -Concerning for ischemia but no NSTEMI -Has not had troponin checked since presentation -Most likely rate-related changes, but she has had persistent tachycardia for hours now -Will run serial troponins  DVT prophylaxis:  Lovenox  Code Status: Full - confirmed with patient/family Family Communication: Daughter-in-law present throughout evaluation Disposition Plan:  Home once clinically improved Consults called: None  Admission status: Admit - It is my clinical opinion that admission to INPATIENT is reasonable and necessary because this patient will require at least 2 midnights in the hospital to treat this condition based on the medical complexity of the problems presented.  Given the aforementioned information, the predictability of an adverse outcome is felt to be significant.    Karmen Bongo MD [facility]  If 7PM-7AM, please contact night-coverage [email or web address] Password [email or web address]  03/29/2016, 2:56 AM

## 2016-03-28 NOTE — ED Notes (Signed)
Notified xray of chest xray order.

## 2016-03-28 NOTE — ED Provider Notes (Signed)
I saw and evaluated the patient, reviewed the resident's note and I agree with the findings and plan with the following exceptions.   71 year old female here with a few days of worsening body aches and dyspnea. Seen by up on all just yesterday and started on azithromycin and is only taking one dose. On my exam patient is in respiratory distress. She has retractions, accessory muscle usage, tachypnea and tachycardia. Her skin is dry. Her breath sounds are decreased with significant wheezing and crackles. Heart is tachycardic otherwise no murmurs rubs or gallops are appreciated. No lower extremity swelling or rashes. Chest x-ray is consistent with a right-sided pneumonia. Also suspicious she may have some flu or flulike symptoms so we'll check for that as well. We'll give Rocephin and azithromycin for the acquired pneumonia, fluids, Tylenol, continuous albuterol and they will re-evaluate for level of care needed in the hospital.   CRITICAL CARE Performed by: Merrily Pew Total critical care time: 45 minutes Critical care time was exclusive of separately billable procedures and treating other patients. Critical care was necessary to treat or prevent imminent or life-threatening deterioration. Critical care was time spent personally by me on the following activities: development of treatment plan with patient and/or surrogate as well as nursing, discussions with consultants, evaluation of patient's response to treatment, examination of patient, obtaining history from patient or surrogate, ordering and performing treatments and interventions, ordering and review of laboratory studies, ordering and review of radiographic studies, pulse oximetry and re-evaluation of patient's condition.    EKG Interpretation  Date/Time:  Tuesday March 28 2016 16:01:14 EST Ventricular Rate:  130 PR Interval:    QRS Duration: 77 QT Interval:  328 QTC Calculation: 483 R Axis:   84 Text Interpretation:  Sinus  tachycardia LAE, consider biatrial enlargement Borderline right axis deviation Left ventricular hypertrophy ST depr, consider ischemia, inferior leads likely rate related depressions Confirmed by [facility] MD, Gricelda Foland [phone]) on 03/28/2016 4:13:13 PM         Merrily Pew, MD 03/28/16 1946

## 2016-03-29 ENCOUNTER — Encounter (HOSPITAL_COMMUNITY): Payer: Self-pay | Admitting: General Practice

## 2016-03-29 DIAGNOSIS — R9431 Abnormal electrocardiogram [ECG] [EKG]: Secondary | ICD-10-CM | POA: Diagnosis present

## 2016-03-29 DIAGNOSIS — J1100 Influenza due to unidentified influenza virus with unspecified type of pneumonia: Secondary | ICD-10-CM | POA: Diagnosis present

## 2016-03-29 LAB — CBC WITH DIFFERENTIAL/PLATELET
Basophils Absolute: 0 10*3/uL (ref 0.0–0.1)
Basophils Relative: 1 %
EOS PCT: 1 %
Eosinophils Absolute: 0.1 10*3/uL (ref 0.0–0.7)
HEMATOCRIT: 28.5 % — AB (ref 36.0–46.0)
HEMOGLOBIN: 8.8 g/dL — AB (ref 12.0–15.0)
LYMPHS ABS: 2 10*3/uL (ref 0.7–4.0)
LYMPHS PCT: 39 %
MCH: 22.5 pg — ABNORMAL LOW (ref 26.0–34.0)
MCHC: 30.9 g/dL (ref 30.0–36.0)
MCV: 72.9 fL — AB (ref 78.0–100.0)
Monocytes Absolute: 0.8 10*3/uL (ref 0.1–1.0)
Monocytes Relative: 15 %
Neutro Abs: 2.2 10*3/uL (ref 1.7–7.7)
Neutrophils Relative %: 44 %
Platelets: 161 10*3/uL (ref 150–400)
RBC: 3.91 MIL/uL (ref 3.87–5.11)
RDW: 16.3 % — AB (ref 11.5–15.5)
WBC: 5 10*3/uL (ref 4.0–10.5)

## 2016-03-29 LAB — BASIC METABOLIC PANEL
ANION GAP: 8 (ref 5–15)
BUN: 11 mg/dL (ref 6–20)
CHLORIDE: 109 mmol/L (ref 101–111)
CO2: 25 mmol/L (ref 22–32)
Calcium: 8.6 mg/dL — ABNORMAL LOW (ref 8.9–10.3)
Creatinine, Ser: 0.73 mg/dL (ref 0.44–1.00)
GFR calc Af Amer: >60 mL/min (ref >60)
Glucose, Bld: 121 mg/dL — ABNORMAL HIGH (ref 65–99)
POTASSIUM: 3.2 mmol/L — AB (ref 3.5–5.1)
SODIUM: 142 mmol/L (ref 135–145)

## 2016-03-29 LAB — TSH: TSH: <0.01 u[IU]/mL — ABNORMAL LOW (ref 0.350–4.500)

## 2016-03-29 LAB — TROPONIN I: Troponin I: 0.03 ng/mL (ref <0.03)

## 2016-03-29 LAB — STREP PNEUMONIAE URINARY ANTIGEN: STREP PNEUMO URINARY ANTIGEN: NEGATIVE

## 2016-03-29 MED ORDER — SODIUM CHLORIDE 0.9 % IV BOLUS (SEPSIS)
250.0000 mL | Freq: Once | INTRAVENOUS | Status: AC
  Administered 2016-03-29: 250 mL via INTRAVENOUS

## 2016-03-29 MED ORDER — METHYLPREDNISOLONE SODIUM SUCC 125 MG IJ SOLR
60.0000 mg | Freq: Two times a day (BID) | INTRAMUSCULAR | Status: DC
  Administered 2016-03-29 – 2016-03-31 (×5): 60 mg via INTRAVENOUS
  Filled 2016-03-29 (×5): qty 2

## 2016-03-29 MED ORDER — SODIUM CHLORIDE 0.9 % IV BOLUS (SEPSIS)
1000.0000 mL | Freq: Once | INTRAVENOUS | Status: AC
  Administered 2016-03-29: 1000 mL via INTRAVENOUS

## 2016-03-29 MED ORDER — POTASSIUM CHLORIDE CRYS ER 20 MEQ PO TBCR
40.0000 meq | EXTENDED_RELEASE_TABLET | Freq: Four times a day (QID) | ORAL | Status: AC
Start: 2016-03-29 — End: 2016-03-29
  Administered 2016-03-29 (×2): 40 meq via ORAL
  Filled 2016-03-29 (×2): qty 2

## 2016-03-29 MED ORDER — ACETAMINOPHEN 325 MG PO TABS
650.0000 mg | ORAL_TABLET | Freq: Four times a day (QID) | ORAL | Status: DC | PRN
  Administered 2016-03-29: 650 mg via ORAL
  Filled 2016-03-29: qty 2

## 2016-03-29 NOTE — Progress Notes (Signed)
Pt OOB to chair w/o assist.  Tolerated well.  Karie Kirks, RN

## 2016-03-29 NOTE — Progress Notes (Signed)
Flutter valve per Dr. Candiss Norse order.  Karie Kirks, Therapist, sports.

## 2016-03-29 NOTE — Progress Notes (Signed)
Amnion page sent for trop 0.03 and temp decrease to 99.1

## 2016-03-29 NOTE — Progress Notes (Signed)
PROGRESS NOTE                                                                                                                                                                                                             Patient Demographics:    [name], is a 71 y.o. female, DOB - [birth year], [id number]  Admit date - 03/28/2016   Admitting Physician Karmen Bongo, MD  Outpatient Primary MD for the patient is No PCP Per Patient  LOS - 1  Chief Complaint  Patient presents with  . Generalized Body Aches  . Abdominal Pain  . Diarrhea       Brief Narrative   [name] is a 71 y.o. female with medical history significant of asthma and GERD presenting because she couldn't breathe, chills, upset stomach, diarrhea, aches.  Symptoms started Saturday, felt better Sunday, recurred Monday.  +fever to 101.  +cough - productive of goldish thick sputum, admitted for CAP  .     Subjective:    [name] today has, No headache, No chest pain, No abdominal pain - No Nausea, No new weakness tingling or numbness, Improved Cough - SOB.     Assessment  & Plan :     1. CAP - ruled out influenza, continue empiric bacterial antibiotics, she does have coarse breath sounds so I will add flutter valve, encouraged to sit in chair, titrated off oxygen, continue nebulizer treatments, she does have significant history of asthma so low-dose steroids is appropriate. Advance activity likely discharge in 1-2 days.  2. Mild asthma exacerbation. Minimal wheezing but mostly coarse breath sounds. Treatment as in above.  3. GERD. On PPI.  4. Hypokalemia. Replaced.   Diet : Diet Heart Room service appropriate? Yes; Fluid consistency: Thin    Family Communication  :  None  Code Status :  full  Disposition Plan  :  Home 1-2 days  Consults  :  None  Procedures  :    TTE  DVT Prophylaxis  :  Lovenox   Lab Results  Component Value  Date   PLT 161 03/29/2016    Inpatient Medications  Scheduled Meds: . azithromycin  500 mg Oral Q24H  . cefTRIAXone (ROCEPHIN)  IV  1 g Intravenous Q24H  . enoxaparin (LOVENOX) injection  40 mg Subcutaneous Q24H  . famotidine  20  mg Oral QHS  . ipratropium  0.5 mg Nebulization Q6H  . levalbuterol  0.63 mg Nebulization Q6H  . methylPREDNISolone (SOLU-MEDROL) injection  60 mg Intravenous Q12H  . mometasone-formoterol  2 puff Inhalation BID  . montelukast  10 mg Oral QHS  . pantoprazole  40 mg Oral QAC breakfast  . potassium chloride  40 mEq Oral Q6H   Continuous Infusions: PRN Meds:.acetaminophen, albuterol, fluticasone  Antibiotics  :    Anti-infectives    Start     Dose/Rate Route Frequency Ordered Stop   03/29/16 1600  cefTRIAXone (ROCEPHIN) 1 g in dextrose 5 % 50 mL IVPB     1 g 100 mL/hr over 30 Minutes Intravenous Every 24 hours 03/28/16 1942 04/05/16 1559   03/29/16 1600  azithromycin (ZITHROMAX) tablet 500 mg     500 mg Oral Every 24 hours 03/28/16 1942 04/05/16 1559   03/28/16 1600  oseltamivir (TAMIFLU) capsule 75 mg     75 mg Oral  Once 03/28/16 1550 03/28/16 1613   03/28/16 1600  azithromycin (ZITHROMAX) 500 mg in dextrose 5 % 250 mL IVPB     500 mg 250 mL/hr over 60 Minutes Intravenous  Once 03/28/16 1551 03/28/16 1717   03/28/16 1600  cefTRIAXone (ROCEPHIN) 1 g in dextrose 5 % 50 mL IVPB     1 g 100 mL/hr over 30 Minutes Intravenous  Once 03/28/16 1551 03/28/16 1645         Objective:   Vitals:   03/29/16 0300 03/29/16 0400 03/29/16 0520 03/29/16 0910  BP:   (!) 124/53   Pulse:   (!) 110   Resp:   (!) 22   Temp: (!) 101.9 F (38.8 C) 100 F (37.8 C) 99.1 F (37.3 C)   TempSrc: Oral Oral Oral   SpO2:   98% 95%  Weight:   70.8 kg (156 lb)   Height:        Wt Readings from Last 3 Encounters:  03/29/16 70.8 kg (156 lb)  03/17/16 72.6 kg (160 lb)  12/16/15 76.2 kg (168 lb)     Intake/Output Summary (Last 24 hours) at 03/29/16 1051 Last data  filed at 03/29/16 1023  Gross per 24 hour  Intake          2743.75 ml  Output              650 ml  Net          2093.75 ml     Physical Exam  Awake Alert, Oriented X 3, No new F.N deficits, Normal affect [name].AT,PERRAL Supple Neck,No JVD, No cervical lymphadenopathy appriciated.  Symmetrical Chest wall movement, Good air movement bilaterally, Coarse B sounds RRR,No Gallops,Rubs or new Murmurs, No Parasternal Heave +ve B.Sounds, Abd Soft, No tenderness, No organomegaly appriciated, No rebound - guarding or rigidity. No Cyanosis, Clubbing or edema, No new Rash or bruise       Data Review:    CBC  Recent Labs Lab 03/28/16 1549 03/28/16 1557 03/29/16 0323  WBC 6.2  --  5.0  HGB 11.3* 12.9 8.8*  HCT 35.7* 38.0 28.5*  PLT 207  --  161  MCV 71.8*  --  72.9*  MCH 22.7*  --  22.5*  MCHC 31.7  --  30.9  RDW 16.2*  --  16.3*  LYMPHSABS 2.0  --  2.0  MONOABS 0.7  --  0.8  EOSABS 0.0  --  0.1  BASOSABS 0.1  --  0.0    Chemistries  Recent Labs Lab 03/28/16 1542 03/28/16 1557 03/29/16 0323  NA 139 141 142  K 3.8 3.8 3.2*  CL 103 103 109  CO2 22  --  25  GLUCOSE 120* 124* 121*  BUN 15 16 11   CREATININE 0.81 0.70 0.73  CALCIUM 9.7  --  8.6*  AST 29  --   --   ALT 23  --   --   ALKPHOS 128*  --   --   BILITOT 0.7  --   --    ------------------------------------------------------------------------------------------------------------------ No results for input(s): CHOL, HDL, LDLCALC, TRIG, CHOLHDL, LDLDIRECT in the last 72 hours.  Lab Results  Component Value Date   HGBA1C 5.8 (H) 02/23/2015   ------------------------------------------------------------------------------------------------------------------ No results for input(s): TSH, T4TOTAL, T3FREE, THYROIDAB in the last 72 hours.  Invalid input(s): FREET3 ------------------------------------------------------------------------------------------------------------------ No results for input(s): VITAMINB12,  FOLATE, FERRITIN, TIBC, IRON, RETICCTPCT in the last 72 hours.  Coagulation profile No results for input(s): INR, PROTIME in the last 168 hours.  No results for input(s): DDIMER in the last 72 hours.  Cardiac Enzymes  Recent Labs Lab 03/29/16 0323 03/29/16 0937  TROPONINI 0.03* <0.03   ------------------------------------------------------------------------------------------------------------------ No results found for: BNP  Micro Results Recent Results (from the past 240 hour(s))  Blood culture (routine x 2)     Status: None (Preliminary result)   Collection Time: 03/28/16  4:00 PM  Result Value Ref Range Status   Specimen Description BLOOD LEFT HAND  Final   Special Requests BOTTLES DRAWN AEROBIC AND ANAEROBIC 5CC  Final   Culture PENDING  Incomplete   Report Status PENDING  Incomplete  Blood culture (routine x 2)     Status: None (Preliminary result)   Collection Time: 03/28/16  4:05 PM  Result Value Ref Range Status   Specimen Description BLOOD RIGHT HAND  Final   Special Requests BOTTLES DRAWN AEROBIC AND ANAEROBIC 5CC  Final   Culture PENDING  Incomplete   Report Status PENDING  Incomplete    Radiology Reports Dg Chest 2 View  Result Date: 03/28/2016 CLINICAL DATA:  Shortness of breath and fever EXAM: CHEST  2 VIEW COMPARISON:  December 25, 2015 FINDINGS: There are apparent nipple shadows bilaterally. There is rather subtle patchy infiltrate in the right upper lobe. A smaller focus of increased opacity is seen in the right middle lobe superiorly. Lungs elsewhere are clear. Heart size and pulmonary vascularity are normal. No adenopathy. There is atherosclerotic calcification in the aorta. No adenopathy. No bone lesions. IMPRESSION: Areas of patchy infiltrate on the right consistent with pneumonia. Lungs elsewhere clear. Aortic atherosclerosis. Followup PA and lateral chest radiographs recommended in 3-4 weeks following trial of antibiotic therapy to ensure resolution and  exclude underlying malignancy. Note that the area of opacity in superior right middle lobe appears subtly nodular. Short interval follow-up advised in particular given this finding. Electronically Signed   By: Lowella Grip III M.D.   On: 03/28/2016 15:35    Time Spent in minutes  30   Xia Stohr K M.D on 03/29/2016 at 10:51 AM  Between 7am to 7pm - Pager - [phone]  After 7pm go to [email or web address] - password [facility]  [facility] -  Office  [phone]

## 2016-03-29 NOTE — Progress Notes (Signed)
Pt refused bed alarm to be turned.  Instructed pt to call for assistance before getting OOB.  Verbalized understanding.  Will continue to monitor.  Karie Kirks, Therapist, sports.

## 2016-03-29 NOTE — Progress Notes (Signed)
Patient refused bed alarm. Will continue to monitor patient. 

## 2016-03-29 NOTE — Progress Notes (Signed)
Patient given 650 Tylenol for temp of 102.9. On call MD notified.

## 2016-03-30 ENCOUNTER — Inpatient Hospital Stay (HOSPITAL_COMMUNITY): Payer: PPO

## 2016-03-30 DIAGNOSIS — I509 Heart failure, unspecified: Secondary | ICD-10-CM

## 2016-03-30 LAB — BASIC METABOLIC PANEL
Anion gap: 13 (ref 5–15)
BUN: 11 mg/dL (ref 6–20)
CALCIUM: 9.9 mg/dL (ref 8.9–10.3)
CO2: 21 mmol/L — ABNORMAL LOW (ref 22–32)
CREATININE: 0.58 mg/dL (ref 0.44–1.00)
Chloride: 107 mmol/L (ref 101–111)
Glucose, Bld: 149 mg/dL — ABNORMAL HIGH (ref 65–99)
Potassium: 4.4 mmol/L (ref 3.5–5.1)
SODIUM: 141 mmol/L (ref 135–145)

## 2016-03-30 LAB — ECHOCARDIOGRAM COMPLETE
HEIGHTINCHES: 69.75 in
WEIGHTICAEL: 2528 [oz_av]

## 2016-03-30 NOTE — Evaluation (Signed)
Physical Therapy Evaluation and Discharge Patient Details Name: Shequila Weitkamp MRN: [id number] DOB: 06-14-1945 Today's Date: 03/30/2016   History of Present Illness  Pt is a 71 y/o female admitted secondary to SOB, ruled out the flu and found to have CAP. PMH including but not limited to asthma.  Clinical Impression  Pt presented sitting OOB in recliner when PT entered room. Prior to admission, pt reported that she was independent with all functional mobility and continues to work part-time. Pt currently at mod I to supervision level for all mobility. No further acute PT needs at this time. PT signing off.     Follow Up Recommendations No PT follow up    Equipment Recommendations  None recommended by PT    Recommendations for Other Services       Precautions / Restrictions Restrictions Weight Bearing Restrictions: No      Mobility  Bed Mobility               General bed mobility comments: pt sitting OOB in recliner when PT arrived  Transfers Overall transfer level: Modified independent Equipment used: None                Ambulation/Gait Ambulation/Gait assistance: Supervision Ambulation Distance (Feet): 300 Feet Assistive device: None Gait Pattern/deviations: Step-through pattern Gait velocity: WNL Gait velocity interpretation: at or above normal speed for age/gender General Gait Details: no instability or LOB noted  Stairs            Wheelchair Mobility    Modified Rankin (Stroke Patients Only)       Balance Overall balance assessment: No apparent balance deficits (not formally assessed)                                           Pertinent Vitals/Pain Pain Assessment: No/denies pain    Home Living Family/patient expects to be discharged to:: Private residence Living Arrangements: Children;Other relatives Available Help at Discharge: Family;Available PRN/intermittently Type of Home: House Home Access: Level entry      Home Layout: One level Home Equipment: None      Prior Function Level of Independence: Independent         Comments: Pt works part-time     Journalist, newspaper        Extremity/Trunk Assessment   Upper Extremity Assessment Upper Extremity Assessment: Overall WFL for tasks assessed    Lower Extremity Assessment Lower Extremity Assessment: Overall WFL for tasks assessed       Communication   Communication: No difficulties  Cognition Arousal/Alertness: Awake/alert Behavior During Therapy: WFL for tasks assessed/performed Overall Cognitive Status: Within Functional Limits for tasks assessed                      General Comments      Exercises     Assessment/Plan    PT Assessment Patent does not need any further PT services  PT Problem List            PT Treatment Interventions      PT Goals (Current goals can be found in the Care Plan section)  Acute Rehab PT Goals Patient Stated Goal: return home    Frequency     Barriers to discharge        Co-evaluation               End of Session  Activity Tolerance: Patient tolerated treatment well Patient left: in chair;with call bell/phone within reach Nurse Communication: Mobility status         Time: [id number] PT Time Calculation (min) (ACUTE ONLY): 11 min   Charges:   PT Evaluation $PT Eval Low Complexity: 1 Procedure     PT G CodesClearnce Sorrel Lewellyn Fultz 03/30/2016, 5:31 PM Sherie Don, [place], DPT [phone]

## 2016-03-30 NOTE — Progress Notes (Signed)
PROGRESS NOTE                                                                                                                                                                                                             Patient Demographics:    [name], is a 71 y.o. female, DOB - 15-Nov-1945, [id number]  Admit date - 03/28/2016   Admitting Physician Karmen Bongo, MD  Outpatient Primary MD for the patient is No PCP Per Patient  LOS - 2  Chief Complaint  Patient presents with  . Generalized Body Aches  . Abdominal Pain  . Diarrhea       Brief Narrative   [name] is a 71 y.o. female with medical history significant of asthma and GERD presenting because she couldn't breathe, chills, upset stomach, diarrhea, aches.  Symptoms started Saturday, felt better Sunday, recurred Monday.  +fever to 101.  +cough - productive of goldish thick sputum, admitted for CAP  .     Subjective:    [name] today has, No headache, No chest pain, No abdominal pain - No Nausea, No new weakness tingling or numbness, Improved Cough - SOB.     Assessment  & Plan :     1. CAP - ruled out influenza, continue empiric bacterial antibiotics, she does have coarse breath sounds so I will add flutter valve, encouraged to sit in chair, titrated off oxygen, continue nebulizer treatments, she does have significant history of asthma so low-dose steroids is appropriate. Advance activity likely discharge in 1-2 days.  2. Mild asthma exacerbation. Minimal wheezing but mostly coarse breath sounds. Treatment as in above.  3. GERD. On PPI.  4. Hypokalemia. Replaced.  5.Possible hyperthyroidism. TSH low, tachycardic, check free T4 and T3.  6. Tachycardia. Could be due to #1 and #2 along with nebulizer treatments however TSH is low, will check lab work as a #5 along with echocardiogram.    Diet : Diet Heart Room service appropriate? Yes;  Fluid consistency: Thin    Family Communication  :  None  Code Status :  full  Disposition Plan  :  Home 1-2 days  Consults  :  None  Procedures  :    TTE -   DVT Prophylaxis  :  Lovenox   Lab Results  Component Value Date   PLT 161  03/29/2016    Inpatient Medications  Scheduled Meds: . azithromycin  500 mg Oral Q24H  . cefTRIAXone (ROCEPHIN)  IV  1 g Intravenous Q24H  . enoxaparin (LOVENOX) injection  40 mg Subcutaneous Q24H  . famotidine  20 mg Oral QHS  . ipratropium  0.5 mg Nebulization Q6H  . levalbuterol  0.63 mg Nebulization Q6H  . methylPREDNISolone (SOLU-MEDROL) injection  60 mg Intravenous Q12H  . mometasone-formoterol  2 puff Inhalation BID  . montelukast  10 mg Oral QHS  . pantoprazole  40 mg Oral QAC breakfast   Continuous Infusions: PRN Meds:.acetaminophen, albuterol, fluticasone  Antibiotics  :    Anti-infectives    Start     Dose/Rate Route Frequency Ordered Stop   03/29/16 1600  cefTRIAXone (ROCEPHIN) 1 g in dextrose 5 % 50 mL IVPB     1 g 100 mL/hr over 30 Minutes Intravenous Every 24 hours 03/28/16 1942 04/05/16 1559   03/29/16 1600  azithromycin (ZITHROMAX) tablet 500 mg     500 mg Oral Every 24 hours 03/28/16 1942 04/05/16 1559   03/28/16 1600  oseltamivir (TAMIFLU) capsule 75 mg     75 mg Oral  Once 03/28/16 1550 03/28/16 1613   03/28/16 1600  azithromycin (ZITHROMAX) 500 mg in dextrose 5 % 250 mL IVPB     500 mg 250 mL/hr over 60 Minutes Intravenous  Once 03/28/16 1551 03/28/16 1717   03/28/16 1600  cefTRIAXone (ROCEPHIN) 1 g in dextrose 5 % 50 mL IVPB     1 g 100 mL/hr over 30 Minutes Intravenous  Once 03/28/16 1551 03/28/16 1645         Objective:   Vitals:   03/29/16 2336 03/30/16 0154 03/30/16 0607 03/30/16 0817  BP: (!) 152/80  (!) 144/74   Pulse: (!) 109  (!) 110   Resp: 16  18   Temp: 98.3 F (36.8 C)  98.2 F (36.8 C)   TempSrc: Oral  Oral   SpO2: 98% 97% 98% 96%  Weight:   71.7 kg (158 lb)   Height:        Wt  Readings from Last 3 Encounters:  03/30/16 71.7 kg (158 lb)  03/17/16 72.6 kg (160 lb)  12/16/15 76.2 kg (168 lb)     Intake/Output Summary (Last 24 hours) at 03/30/16 1027 Last data filed at 03/30/16 0945  Gross per 24 hour  Intake              890 ml  Output             1850 ml  Net             -960 ml     Physical Exam  Awake Alert, Oriented X 3, No new F.N deficits, Normal affect Benedict.AT,PERRAL Supple Neck,No JVD, No cervical lymphadenopathy appriciated.  Symmetrical Chest wall movement, Good air movement bilaterally, Coarse B sounds RRR,No Gallops,Rubs or new Murmurs, No Parasternal Heave +ve B.Sounds, Abd Soft, No tenderness, No organomegaly appriciated, No rebound - guarding or rigidity. No Cyanosis, Clubbing or edema, No new Rash or bruise       Data Review:    CBC  Recent Labs Lab 03/28/16 1549 03/28/16 1557 03/29/16 0323  WBC 6.2  --  5.0  HGB 11.3* 12.9 8.8*  HCT 35.7* 38.0 28.5*  PLT 207  --  161  MCV 71.8*  --  72.9*  MCH 22.7*  --  22.5*  MCHC 31.7  --  30.9  RDW 16.2*  --  16.3*  LYMPHSABS 2.0  --  2.0  MONOABS 0.7  --  0.8  EOSABS 0.0  --  0.1  BASOSABS 0.1  --  0.0    Chemistries   Recent Labs Lab 03/28/16 1542 03/28/16 1557 03/29/16 0323 03/30/16 0452  NA 139 141 142 141  K 3.8 3.8 3.2* 4.4  CL 103 103 109 107  CO2 22  --  25 21*  GLUCOSE 120* 124* 121* 149*  BUN 15 16 11 11   CREATININE 0.81 0.70 0.73 0.58  CALCIUM 9.7  --  8.6* 9.9  AST 29  --   --   --   ALT 23  --   --   --   ALKPHOS 128*  --   --   --   BILITOT 0.7  --   --   --    ------------------------------------------------------------------------------------------------------------------ No results for input(s): CHOL, HDL, LDLCALC, TRIG, CHOLHDL, LDLDIRECT in the last 72 hours.  Lab Results  Component Value Date   HGBA1C 5.8 (H) 02/23/2015    ------------------------------------------------------------------------------------------------------------------  Recent Labs  03/29/16 0937  TSH <0.010*   ------------------------------------------------------------------------------------------------------------------ No results for input(s): VITAMINB12, FOLATE, FERRITIN, TIBC, IRON, RETICCTPCT in the last 72 hours.  Coagulation profile No results for input(s): INR, PROTIME in the last 168 hours.  No results for input(s): DDIMER in the last 72 hours.  Cardiac Enzymes  Recent Labs Lab 03/29/16 0323 03/29/16 0937 03/29/16 1443  TROPONINI 0.03* <0.03 <0.03   ------------------------------------------------------------------------------------------------------------------ No results found for: BNP  Micro Results Recent Results (from the past 240 hour(s))  Blood culture (routine x 2)     Status: None (Preliminary result)   Collection Time: 03/28/16  4:00 PM  Result Value Ref Range Status   Specimen Description BLOOD LEFT HAND  Final   Special Requests BOTTLES DRAWN AEROBIC AND ANAEROBIC 5CC  Final   Culture NO GROWTH < 24 HOURS  Final   Report Status PENDING  Incomplete  Blood culture (routine x 2)     Status: None (Preliminary result)   Collection Time: 03/28/16  4:05 PM  Result Value Ref Range Status   Specimen Description BLOOD RIGHT HAND  Final   Special Requests BOTTLES DRAWN AEROBIC AND ANAEROBIC 5CC  Final   Culture NO GROWTH < 24 HOURS  Final   Report Status PENDING  Incomplete    Radiology Reports  Dg Chest 2 View  Result Date: 03/28/2016 CLINICAL DATA:  Shortness of breath and fever EXAM: CHEST  2 VIEW COMPARISON:  December 25, 2015 FINDINGS: There are apparent nipple shadows bilaterally. There is rather subtle patchy infiltrate in the right upper lobe. A smaller focus of increased opacity is seen in the right middle lobe superiorly. Lungs elsewhere are clear. Heart size and pulmonary vascularity are  normal. No adenopathy. There is atherosclerotic calcification in the aorta. No adenopathy. No bone lesions. IMPRESSION: Areas of patchy infiltrate on the right consistent with pneumonia. Lungs elsewhere clear. Aortic atherosclerosis. Followup PA and lateral chest radiographs recommended in 3-4 weeks following trial of antibiotic therapy to ensure resolution and exclude underlying malignancy. Note that the area of opacity in superior right middle lobe appears subtly nodular. Short interval follow-up advised in particular given this finding. Electronically Signed   By: Lowella Grip III M.D.   On: 03/28/2016 15:35    Time Spent in minutes  30   SINGH,PRASHANT K M.D on 03/30/2016 at 10:27 AM  Between 7am to 7pm - Pager - [phone]  After 7pm go to  [email or web address] - password [facility]  [facility] -  Office  [phone]

## 2016-03-30 NOTE — Progress Notes (Signed)
  Echocardiogram 2D Echocardiogram has been performed.  Tresa Res 03/30/2016, 3:12 PM

## 2016-03-30 NOTE — Progress Notes (Signed)
Patient refused bed alarm. Will continue to monitor patient. 

## 2016-03-30 NOTE — Care Management Note (Signed)
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 CM will continue to follow for DCP  Expected Discharge Date: possibly 03/31/2016                 Expected Discharge Plan:  Home/Self Care  Discharge planning Services  CM Consult, Follow-up appt scheduled   Status of Service:  In process, will continue to follow  Sherrilyn Rist [id number] 03/30/2016, 3:55 PM

## 2016-03-31 LAB — T3: T3, Total: 203 ng/dL — ABNORMAL HIGH (ref 71–180)

## 2016-03-31 LAB — T3, FREE: T3 FREE: 7.3 pg/mL — AB (ref 2.0–4.4)

## 2016-03-31 MED ORDER — METOPROLOL TARTRATE 25 MG PO TABS
25.0000 mg | ORAL_TABLET | Freq: Two times a day (BID) | ORAL | Status: DC
  Administered 2016-03-31: 25 mg via ORAL
  Filled 2016-03-31: qty 1

## 2016-03-31 MED ORDER — PREDNISONE 5 MG PO TABS: ORAL_TABLET | ORAL | 0 refills | Status: DC

## 2016-03-31 MED ORDER — AZITHROMYCIN 500 MG PO TABS: 500.0000 mg | ORAL_TABLET | ORAL | 0 refills | Status: DC

## 2016-03-31 MED ORDER — METHIMAZOLE 10 MG PO TABS
20.0000 mg | ORAL_TABLET | Freq: Two times a day (BID) | ORAL | Status: DC
  Administered 2016-03-31: 20 mg via ORAL
  Filled 2016-03-31: qty 2

## 2016-03-31 MED ORDER — METHIMAZOLE 10 MG PO TABS
20.0000 mg | ORAL_TABLET | Freq: Two times a day (BID) | ORAL | 0 refills | Status: DC
Start: 2016-03-31 — End: 2016-04-18

## 2016-03-31 MED ORDER — METOPROLOL TARTRATE 25 MG PO TABS: 25.0000 mg | ORAL_TABLET | Freq: Two times a day (BID) | ORAL | 0 refills | Status: DC

## 2016-03-31 MED ORDER — CEFPODOXIME PROXETIL 200 MG PO TABS: 200.0000 mg | ORAL_TABLET | Freq: Two times a day (BID) | ORAL | 0 refills | Status: DC

## 2016-03-31 NOTE — Discharge Summary (Signed)
[name] [id number] DOB: [birth year] DOA: 03/28/2016  PCP: No PCP Per Patient  Admit date: 03/28/2016  Discharge date: 03/31/2016  Admitted From: Home  Disposition:  Home   Recommendations for Outpatient Follow-up:   Follow up with PCP in 1-2 weeks  PCP Please obtain BMP/CBC, 2 view CXR in 1week,  (see Discharge instructions)   PCP Please follow up on the following pending results: None   Home Health: None   Equipment/Devices: None Consultations: None Discharge Condition: Stable   CODE STATUS: Full   Diet Recommendation:  Heart Healthy    Chief Complaint  Patient presents with  . Generalized Body Aches  . Abdominal Pain  . Diarrhea     Brief history of present illness from the day of admission and additional interim summary    Terri Smithis a 71 y.o.femalewith medical history significant of asthma and GERD presenting because she couldn't breathe, chills, upset stomach, diarrhea, aches. Symptoms started Saturday, felt better Sunday, recurred Monday. +fever to 101. +cough - productive of goldish thick sputum, admitted for CAP  .                                                                  Hospital Course    1. CAP In a patient with moderately persistent asthma- ruled out influenza, Much improved with empiric IV antibiotics and steroids, also had flutter valve for pulmonary toiletry, much improved now, still has mild wheezing but is not short of breath, not requiring any oxygen, ambulating without any discomfort, will be placed on oral antibiotics, steroid taper, requested to take flutter valve home and use 3-4 times every hour while awake, follow with primary pulmonary physician Dr. Melvyn Novas in a week continue home nebulizer treatments unchanged..  2. Mild asthma exacerbation. Minimal wheezing  but mostly coarse breath sounds. Treatment as in above.  3. GERD. On PPI.  4. Hypokalemia. Replaced.  5. Hyperthyroidism. Placed on methimazole and beta blocker, requested to follow with PCP and endocrinology within a week.  6. Tachycardia. Was found to be hyperthyroid, plan as in above, echo stable when accounted for hyperthyroidism.   Discharge diagnosis     Principal Problem:   Influenza with pneumonia Active Problems:   Acute asthma exacerbation   CAP (community acquired pneumonia)   Nonspecific abnormal electrocardiogram (ECG) (EKG)    Discharge instructions    Discharge Instructions    Diet - low sodium heart healthy    Complete by:  As directed    Discharge instructions    Complete by:  As directed    Follow with Primary MD  in 7 days   Get CBC, CMP, TSH, Free T4 , T3 & 2 view Chest X ray checked  by Primary MD or SNF MD in 5-7 days ( we routinely change or  add medications that can affect your baseline labs and fluid status, therefore we recommend that you get the mentioned basic workup next visit with your PCP, your PCP may decide not to get them or add new tests based on their clinical decision)  Activity: As tolerated with Full fall precautions use walker/cane & assistance as needed  Disposition Home   Diet: Heart Healthy    For Heart failure patients - Check your Weight same time everyday, if you gain over 2 pounds, or you develop in leg swelling, experience more shortness of breath or chest pain, call your Primary MD immediately. Follow Cardiac Low Salt Diet and 1.5 lit/day fluid restriction.  On your next visit with your primary care physician please Get Medicines reviewed and adjusted.  Please request your Prim.MD to go over all Hospital Tests and Procedure/Radiological results at the follow up, please get all Hospital records sent to your Prim MD by signing hospital release before you go home.  If you experience worsening of your admission symptoms,  develop shortness of breath, life threatening emergency, suicidal or homicidal thoughts you must seek medical attention immediately by calling 911 or calling your MD immediately  if symptoms less severe.  You Must read complete instructions/literature along with all the possible adverse reactions/side effects for all the Medicines you take and that have been prescribed to you. Take any new Medicines after you have completely understood and accpet all the possible adverse reactions/side effects.   Do not drive, operate heavy machinery, perform activities at heights, swimming or participation in water activities or provide baby sitting services if your were admitted for syncope or siezures until you have seen by Primary MD or a Neurologist and advised to do so again.  Do not drive when taking Pain medications.    Do not take more than prescribed Pain, Sleep and Anxiety Medications  Special Instructions: If you have smoked or chewed Tobacco  in the last 2 yrs please stop smoking, stop any regular Alcohol  and or any Recreational drug use.  Wear Seat belts while driving.   Please note  You were cared for by a hospitalist during your hospital stay. If you have any questions about your discharge medications or the care you received while you were in the hospital after you are discharged, you can call the unit and asked to speak with the hospitalist on call if the hospitalist that took care of you is not available. Once you are discharged, your primary care physician will handle any further medical issues. Please note that NO REFILLS for any discharge medications will be authorized once you are discharged, as it is imperative that you return to your primary care physician (or establish a relationship with a primary care physician if you do not have one) for your aftercare needs so that they can reassess your need for medications and monitor your lab values.   Increase activity slowly    Complete by:  As  directed       Discharge Medications   Allergies as of 03/31/2016      Reactions   Aspirin Shortness Of Breath      Medication List    TAKE these medications   acetaminophen 325 MG tablet Commonly known as:  TYLENOL Take 325-650 mg by mouth every 6 (six) hours as needed for mild pain or headache.   albuterol (2.5 MG/3ML) 0.083% nebulizer solution Commonly known as:  PROVENTIL Take 3 mLs (2.5 mg total) by nebulization every 6 (six) hours  as needed. asthma What changed:  reasons to take this  additional instructions   albuterol 108 (90 Base) MCG/ACT inhaler Commonly known as:  PROVENTIL HFA;VENTOLIN HFA Inhale 2 puffs into the lungs every 6 (six) hours as needed for wheezing. What changed:  Another medication with the same name was changed. Make sure you understand how and when to take each.   azithromycin 500 MG tablet Commonly known as:  ZITHROMAX Take 1 tablet (500 mg total) by mouth daily. What changed:  medication strength  how much to take  how to take this  when to take this  additional instructions   budesonide-formoterol 160-4.5 MCG/ACT inhaler Commonly known as:  SYMBICORT Inhale 2 puffs into the lungs 2 (two) times daily.   cefpodoxime 200 MG tablet Commonly known as:  VANTIN Take 1 tablet (200 mg total) by mouth 2 (two) times daily.   famotidine 20 MG tablet Commonly known as:  PEPCID One at bedtime What changed:  how much to take  how to take this  when to take this  additional instructions   fluticasone 50 MCG/ACT nasal spray Commonly known as:  FLONASE Place 2 sprays into the nose daily. What changed:  how to take this  when to take this  reasons to take this   methimazole 10 MG tablet Commonly known as:  TAPAZOLE Take 2 tablets (20 mg total) by mouth 2 (two) times daily.   metoprolol tartrate 25 MG tablet Commonly known as:  LOPRESSOR Take 1 tablet (25 mg total) by mouth 2 (two) times daily.   montelukast 10 MG  tablet Commonly known as:  SINGULAIR Take 1 tablet (10 mg total) by mouth at bedtime.   pantoprazole 40 MG tablet Commonly known as:  PROTONIX Take 1 tablet (40 mg total) by mouth daily before breakfast.   predniSONE 5 MG tablet Commonly known as:  DELTASONE Label  & dispense according to the schedule below. 10 Pills PO for 3 days then, 8 Pills PO for 3 days, 6 Pills PO for 3 days, 4 Pills PO for 3 days, 2 Pills PO for 3 days, 1 Pills PO for 3 days, 1/2 Pill  PO for 3 days then STOP. Total 95 pills.       Follow-up Information    Wilfred Lacy, NP Follow up on 05/01/2016.   Specialty:  Internal Medicine Why:  at 11 am; please try to keep your apt or call to reshedule. Contact information: [address] [address] [phone]        Renato Shin, MD. Schedule an appointment as soon as possible for a visit in 1 week(s).   Specialty:  Endocrinology Why:  Hyper thyroidism Contact information: [address] [place] [address] [phone]        Christinia Gully, MD. Schedule an appointment as soon as possible for a visit in 1 week(s).   Specialty:  Pulmonary Disease Contact information: [address] [place] [address] [phone]           Major procedures and Radiology Reports - PLEASE review detailed and final reports thoroughly  -     TTE - Left ventricle: The cavity size was normal. Systolic function was normal. The estimated ejection fraction was in the range of 55% to 60%. Wall motion was normal; there were no regional wall motion abnormalities. There was an increased relative contribution of atrial contraction to ventricular filling. - Mitral valve: There was mild to moderate regurgitation directed centrally. -  Left atrium: The atrium was mildly dilated. - Right atrium: The atrium was mildly dilated. - High velocity flow is seen across all valves, consistent with high cardiac output.  Impressions:  Findings suggest  &quot;high output failure&quot; (consider chronic anemia, thyrotoxicosis, AV shunt, etc.)   Dg Chest 2 View  Result Date: 03/28/2016 CLINICAL DATA:  Shortness of breath and fever EXAM: CHEST  2 VIEW COMPARISON:  December 25, 2015 FINDINGS: There are apparent nipple shadows bilaterally. There is rather subtle patchy infiltrate in the right upper lobe. A smaller focus of increased opacity is seen in the right middle lobe superiorly. Lungs elsewhere are clear. Heart size and pulmonary vascularity are normal. No adenopathy. There is atherosclerotic calcification in the aorta. No adenopathy. No bone lesions. IMPRESSION: Areas of patchy infiltrate on the right consistent with pneumonia. Lungs elsewhere clear. Aortic atherosclerosis. Followup PA and lateral chest radiographs recommended in 3-4 weeks following trial of antibiotic therapy to ensure resolution and exclude underlying malignancy. Note that the area of opacity in superior right middle lobe appears subtly nodular. Short interval follow-up advised in particular given this finding. Electronically Signed   By: Lowella Grip III M.D.   On: 03/28/2016 15:35    Micro Results    Recent Results (from the past 240 hour(s))  Blood culture (routine x 2)     Status: None (Preliminary result)   Collection Time: 03/28/16  4:00 PM  Result Value Ref Range Status   Specimen Description BLOOD LEFT HAND  Final   Special Requests BOTTLES DRAWN AEROBIC AND ANAEROBIC 5CC  Final   Culture NO GROWTH 2 DAYS  Final   Report Status PENDING  Incomplete  Blood culture (routine x 2)     Status: None (Preliminary result)   Collection Time: 03/28/16  4:05 PM  Result Value Ref Range Status   Specimen Description BLOOD RIGHT HAND  Final   Special Requests BOTTLES DRAWN AEROBIC AND ANAEROBIC 5CC  Final   Culture NO GROWTH 2 DAYS  Final   Report Status PENDING  Incomplete    Today   Subjective    Margaree Reitmeier today has no headache,no chest abdominal pain,no new  weakness tingling or numbness, feels much better wants to go home today.    Objective   Blood pressure (!) 148/75, pulse 81, temperature 97.4 F (36.3 C), temperature source Oral, resp. rate 16, height 5' 9.75" (1.772 m), weight 72.2 kg (159 lb 1.6 oz), SpO2 100 %.   Intake/Output Summary (Last 24 hours) at 03/31/16 1118 Last data filed at 03/31/16 0900  Gross per 24 hour  Intake             1130 ml  Output              625 ml  Net              505 ml    Exam Awake Alert, Oriented x 3, No new F.N deficits, Normal affect Wilkin.AT,PERRAL Supple Neck,No JVD, No cervical lymphadenopathy appriciated.  Symmetrical Chest wall movement, Good air movement bilaterally, CTAB RRR,No Gallops,Rubs or new Murmurs, No Parasternal Heave +ve B.Sounds, Abd Soft, Non tender, No organomegaly appriciated, No rebound -guarding or rigidity. No Cyanosis, Clubbing or edema, No new Rash or bruise   Data Review   Results for ADRYANA, HEIMBIGNER (MRN [id number]) as of 03/31/2016 11:12  Ref. Range 03/28/2016 15:42 03/28/2016 15:57 03/29/2016 03:23 03/29/2016 09:37 03/30/2016 04:52 03/30/2016 11:32  TSH Latest Ref Range: 0.350 - 4.500 uIU/mL    <  0.010 (L)    Triiodothyronine,Free,Serum Latest Ref Range: 2.0 - 4.4 pg/mL      7.3 (H)  Triiodothyronine (T3) Latest Ref Range: 71 - 180 ng/dL      203 (H)    CBC w Diff:  Lab Results  Component Value Date   WBC 5.0 03/29/2016   HGB 8.8 (L) 03/29/2016   HCT 28.5 (L) 03/29/2016   PLT 161 03/29/2016   LYMPHOPCT 39 03/29/2016   BANDSPCT 0 09/11/2010   MONOPCT 15 03/29/2016   EOSPCT 1 03/29/2016   BASOPCT 1 03/29/2016    CMP:  Lab Results  Component Value Date   NA 141 03/30/2016   K 4.4 03/30/2016   CL 107 03/30/2016   CO2 21 (L) 03/30/2016   BUN 11 03/30/2016   CREATININE 0.58 03/30/2016   PROT 7.2 03/28/2016   ALBUMIN 3.7 03/28/2016   BILITOT 0.7 03/28/2016   ALKPHOS 128 (H) 03/28/2016   AST 29 03/28/2016   ALT 23 03/28/2016  .   Total Time in preparing paper  work, data evaluation and todays exam - 35 minutes  Thurnell Lose M.D on 03/31/2016 at 11:18 AM  [facility]   Office  [phone]

## 2016-03-31 NOTE — Progress Notes (Signed)
SATURATION QUALIFICATIONS: (This note is used to comply with regulatory documentation for home oxygen)  Patient Saturations on Room Air at Rest = 100%  Patient Saturations on Room Air while Ambulating = 98%  Patient Saturations on 0 Liters of oxygen while Ambulating = 98%  Please briefly explain why patient needs home oxygen: patient does not need 02

## 2016-03-31 NOTE — Discharge Instructions (Signed)
Asthma, Adult Asthma is a condition of the lungs in which the airways tighten and narrow. Asthma can make it hard to breathe. Asthma cannot be cured, but medicine and lifestyle changes can help control it. Asthma may be started (triggered) by:  Animal skin flakes (dander).  Dust.  Cockroaches.  Pollen.  Mold.  Smoke.  Cleaning products.  Hair sprays or aerosol sprays.  Paint fumes or strong smells.  Cold air, weather changes, and winds.  Crying or laughing hard.  Stress.  Certain medicines or drugs.  Foods, such as dried fruit, potato chips, and sparkling grape juice.  Infections or conditions (colds, flu).  Exercise.  Certain medical conditions or diseases.  Exercise or tiring activities. Follow these instructions at home:  Take medicine as told by your doctor.  Use a peak flow meter as told by your doctor. A peak flow meter is a tool that measures how well the lungs are working.  Record and keep track of the peak flow meter's readings.  Understand and use the asthma action plan. An asthma action plan is a written plan for taking care of your asthma and treating your attacks.  To help prevent asthma attacks:  Do not smoke. Stay away from secondhand smoke.  Change your heating and air conditioning filter often.  Limit your use of fireplaces and wood stoves.  Get rid of pests (such as roaches and mice) and their droppings.  Throw away plants if you see mold on them.  Clean your floors. Dust regularly. Use cleaning products that do not smell.  Have someone vacuum when you are not home. Use a vacuum cleaner with a HEPA filter if possible.  Replace carpet with wood, tile, or vinyl flooring. Carpet can trap animal skin flakes and dust.  Use allergy-proof pillows, mattress covers, and box spring covers.  Wash bed sheets and blankets every week in hot water and dry them in a dryer.  Use blankets that are made of polyester or cotton.  Clean  bathrooms and kitchens with bleach. If possible, have someone repaint the walls in these rooms with mold-resistant paint. Keep out of the rooms that are being cleaned and painted.  Wash hands often. Contact a doctor if:  You have make a whistling sound when breaking (wheeze), have shortness of breath, or have a cough even if taking medicine to prevent attacks.  The colored mucus you cough up (sputum) is thicker than usual.  The colored mucus you cough up changes from clear or white to yellow, green, gray, or bloody.  You have problems from the medicine you are taking such as:  A rash.  Itching.  Swelling.  Trouble breathing.  You need reliever medicines more than 2-3 times a week.  Your peak flow measurement is still at 50-79% of your personal best after following the action plan for 1 hour.  You have a fever. Get help right away if:  You seem to be worse and are not responding to medicine during an asthma attack.  You are short of breath even at rest.  You get short of breath when doing very little activity.  You have trouble eating, drinking, or talking.  You have chest pain.  You have a fast heartbeat.  Your lips or fingernails start to turn blue.  You are light-headed, dizzy, or faint.  Your peak flow is less than 50% of your personal best. This information is not intended to replace advice given to you by your health care provider. Make  sure you discuss any questions you have with your health care provider. Document Released: 07/26/2007 Document Revised: 07/15/2015 Document Reviewed: 09/05/2012 Elsevier Interactive Patient Education  2017 [place] Follow with Primary MD  in 7 days    Community-Acquired Pneumonia, Adult Introduction Pneumonia is an infection of the lungs. One type of pneumonia can happen while a person is in a hospital. A different type can happen when a person is not in a hospital (community-acquired pneumonia). It is easy for this kind  to spread from person to person. It can spread to you if you breathe near an infected person who coughs or sneezes. Some symptoms include:  A dry cough.  A wet (productive) cough.  Fever.  Sweating.  Chest pain. Follow these instructions at home:  Take over-the-counter and prescription medicines only as told by your doctor.  Only take cough medicine if you are losing sleep.  If you were prescribed an antibiotic medicine, take it as told by your doctor. Do not stop taking the antibiotic even if you start to feel better.  Sleep with your head and neck raised (elevated). You can do this by putting a few pillows under your head, or you can sleep in a recliner.  Do not use tobacco products. These include cigarettes, chewing tobacco, and e-cigarettes. If you need help quitting, ask your doctor.  Drink enough water to keep your pee (urine) clear or pale yellow. A shot (vaccine) can help prevent pneumonia. Shots are often suggested for:  People older than 73 years of age.  People older than 73 years of age:  Who are having cancer treatment.  Who have long-term (chronic) lung disease.  Who have problems with their body's defense system (immune system). You may also prevent pneumonia if you take these actions:  Get the flu (influenza) shot every year.  Go to the dentist as often as told.  Wash your hands often. If soap and water are not available, use hand sanitizer. Contact a doctor if:  You have a fever.  You lose sleep because your cough medicine does not help. Get help right away if:  You are short of breath and it gets worse.  You have more chest pain.  Your sickness gets worse. This is very serious if:  You are an older adult.  Your body's defense system is weak.  You cough up blood. This information is not intended to replace advice given to you by your health care provider. Make sure you discuss any questions you have with your health care provider. Document  Released: 07/26/2007 Document Revised: 07/15/2015 Document Reviewed: 06/03/2014  2017 Elsevier  Get CBC, CMP, TSH, Free T4 , T3 & 2 view Chest X ray checked  by Primary MD or SNF MD in 5-7 days ( we routinely change or add medications that can affect your baseline labs and fluid status, therefore we recommend that you get the mentioned basic workup next visit with your PCP, your PCP may decide not to get them or add new tests based on their clinical decision)  Activity: As tolerated with Full fall precautions use walker/cane & assistance as needed  Disposition Home   Diet: Heart Healthy    For Heart failure patients - Check your Weight same time everyday, if you gain over 2 pounds, or you develop in leg swelling, experience more shortness of breath or chest pain, call your Primary MD immediately. Follow Cardiac Low Salt Diet and 1.5 lit/day fluid restriction.  On your next visit with  your primary care physician please Get Medicines reviewed and adjusted.  Please request your Prim.MD to go over all Hospital Tests and Procedure/Radiological results at the follow up, please get all Hospital records sent to your Prim MD by signing hospital release before you go home.  If you experience worsening of your admission symptoms, develop shortness of breath, life threatening emergency, suicidal or homicidal thoughts you must seek medical attention immediately by calling 911 or calling your MD immediately  if symptoms less severe.  You Must read complete instructions/literature along with all the possible adverse reactions/side effects for all the Medicines you take and that have been prescribed to you. Take any new Medicines after you have completely understood and accpet all the possible adverse reactions/side effects.   Do not drive, operate heavy machinery, perform activities at heights, swimming or participation in water activities or provide baby sitting services if your were admitted for syncope or  siezures until you have seen by Primary MD or a Neurologist and advised to do so again.  Do not drive when taking Pain medications.    Do not take more than prescribed Pain, Sleep and Anxiety Medications  Special Instructions: If you have smoked or chewed Tobacco  in the last 2 yrs please stop smoking, stop any regular Alcohol  and or any Recreational drug use.  Wear Seat belts while driving.   Please note  You were cared for by a hospitalist during your hospital stay. If you have any questions about your discharge medications or the care you received while you were in the hospital after you are discharged, you can call the unit and asked to speak with the hospitalist on call if the hospitalist that took care of you is not available. Once you are discharged, your primary care physician will handle any further medical issues. Please note that NO REFILLS for any discharge medications will be authorized once you are discharged, as it is imperative that you return to your primary care physician (or establish a relationship with a primary care physician if you do not have one) for your aftercare needs so that they can reassess your need for medications and monitor your lab values.

## 2016-03-31 NOTE — Progress Notes (Signed)
Received Report patient alert and oriented no distress

## 2016-04-02 LAB — CULTURE, BLOOD (ROUTINE X 2)
CULTURE: NO GROWTH
Culture: NO GROWTH

## 2016-04-10 ENCOUNTER — Ambulatory Visit (INDEPENDENT_AMBULATORY_CARE_PROVIDER_SITE_OTHER)
Admission: RE | Admit: 2016-04-10 | Discharge: 2016-04-10 | Disposition: A | Discharge status: Home / Self Care | Payer: PPO | Source: Ambulatory Visit | Attending: Acute Care | Admitting: Acute Care

## 2016-04-10 ENCOUNTER — Other Ambulatory Visit (INDEPENDENT_AMBULATORY_CARE_PROVIDER_SITE_OTHER): Payer: PPO

## 2016-04-10 ENCOUNTER — Ambulatory Visit (INDEPENDENT_AMBULATORY_CARE_PROVIDER_SITE_OTHER): Payer: PPO | Admitting: Acute Care

## 2016-04-10 ENCOUNTER — Encounter: Payer: Self-pay | Admitting: Acute Care

## 2016-04-10 VITALS — BP 142/62 | HR 80 | Ht 69.75 in | Wt 164.0 lb

## 2016-04-10 DIAGNOSIS — J189 Pneumonia, unspecified organism: Secondary | ICD-10-CM

## 2016-04-10 DIAGNOSIS — R918 Other nonspecific abnormal finding of lung field: Secondary | ICD-10-CM | POA: Diagnosis not present

## 2016-04-10 DIAGNOSIS — J45901 Unspecified asthma with (acute) exacerbation: Secondary | ICD-10-CM

## 2016-04-10 DIAGNOSIS — R911 Solitary pulmonary nodule: Secondary | ICD-10-CM | POA: Diagnosis not present

## 2016-04-10 LAB — BASIC METABOLIC PANEL
BUN: 15 mg/dL (ref 6–23)
CALCIUM: 8.7 mg/dL (ref 8.4–10.5)
CO2: 31 mEq/L (ref 19–32)
Chloride: 107 mEq/L (ref 96–112)
Creatinine, Ser: 0.65 mg/dL (ref 0.40–1.20)
GFR: 115 mL/min (ref >60.00)
Glucose, Bld: 140 mg/dL — ABNORMAL HIGH (ref 70–99)
POTASSIUM: 3.4 meq/L — AB (ref 3.5–5.1)
SODIUM: 144 meq/L (ref 135–145)

## 2016-04-10 LAB — CBC WITH DIFFERENTIAL/PLATELET
BASOS ABS: 0 10*3/uL (ref 0.0–0.1)
Basophils Relative: 0.4 % (ref 0.0–3.0)
EOS PCT: 0.1 % (ref 0.0–5.0)
Eosinophils Absolute: 0 10*3/uL (ref 0.0–0.7)
HCT: 31.8 % — ABNORMAL LOW (ref 36.0–46.0)
HEMOGLOBIN: 10.1 g/dL — AB (ref 12.0–15.0)
LYMPHS ABS: 1.8 10*3/uL (ref 0.7–4.0)
Lymphocytes Relative: 17.6 % (ref 12.0–46.0)
MCHC: 31.7 g/dL (ref 30.0–36.0)
MCV: 71.9 fl — AB (ref 78.0–100.0)
Monocytes Absolute: 0.4 10*3/uL (ref 0.1–1.0)
Monocytes Relative: 4 % (ref 3.0–12.0)
Neutro Abs: 8.1 10*3/uL — ABNORMAL HIGH (ref 1.4–7.7)
Neutrophils Relative %: 77.9 % — ABNORMAL HIGH (ref 43.0–77.0)
Platelets: 273 10*3/uL (ref 150.0–400.0)
RBC: 4.42 Mil/uL (ref 3.87–5.11)
RDW: 17.3 % — ABNORMAL HIGH (ref 11.5–15.5)
WBC: 10.3 10*3/uL (ref 4.0–10.5)

## 2016-04-10 MED ORDER — MONTELUKAST SODIUM 10 MG PO TABS: 10.0000 mg | ORAL_TABLET | Freq: Every day | ORAL | 3 refills | Status: DC

## 2016-04-10 NOTE — Assessment & Plan Note (Signed)
Resolving Recent asthma exacerbation secondary to community-acquired pneumonia. Plan. Continue taking your prednisone as you have been doing until completed. Continue Symbicort 2 puffs twice daily as you have been doing. Rinse mouth after use. Continue Singulair 1 tablet at bedtime. We will renew this prescription for you. Follow up with Dr. Melvyn Novas in 3 months. Please contact office for sooner follow up if symptoms do not improve or worsen or seek emergency care  :

## 2016-04-10 NOTE — Patient Instructions (Addendum)
It is nice to meet you today. We will check a CXR today We will draw labs ( BMET, CBC) We will call with results. Continue taking your prednisone as you have been doing until completed. Continue Symbicort 2 puffs twice daily as you have been doing. Rinse mouth after use. Continue Singulair 1 tablet at bedtime. We will renew this prescription for you. Follow up with Dr. Melvyn Novas in 3 months. Please contact office for sooner follow up if symptoms do not improve or worsen or seek emergency care  Addendum: Discussed with Dr. Melvyn Novas follow up CXR in 3 months as follow up to nodule.

## 2016-04-10 NOTE — Assessment & Plan Note (Signed)
Resolved community-acquired pneumonia requiring hospitalization.( Chest x-ray 04/10/16 interval resolution of previously noted opacity. Treatment with IV antibiotics, IV steroids, scheduled bronchodilators, and supplemental oxygen. Aggressive pulmonary toilet with flutter valve Plan: We will check a CXR today We will draw labs ( BMET, CBC) We will call with results. Continue taking your prednisone as you have been doing until completed. Follow up with Dr. Melvyn Novas in 3 months. Please contact office for sooner follow up if symptoms do not improve or worsen or seek emergency care  Addendum: Discussed with Dr. Melvyn Novas follow up CXR in 3 months as follow up to nodule.

## 2016-04-10 NOTE — Progress Notes (Signed)
History of Present Illness [name] is a 71 y.o. female with remote minimal smoking history with history of  Asthma and GERD. She is followed by Dr. Melvyn Novas.   04/10/2016 Hospital Follow Up: Pt. Presents for hospital follow up. She was admitted 2/6-2/9 for acute asthma exacerbation with fever of 101, positive cough, and gold thick secretions. She failed outpatient  Treatment with a Z pack, and  got progressively worse and required admission.She was diagnosed with CAP and asthma exacerbation. Influenza was negative .She was treated with IV  antibiotics and IV steroids, scheduled BD's,  supplemental oxygen, flutter valve and aggressive pulmonary toilet. She presents today feeling much better. She completed her Z pack, and she is completing  Her  prednisone taper. She denies shortness of breath, wheezing, cough, neither productive or non-productive. She denies fever, chest pain, orthopnea or hemoptysis.She states she is rarely using her rescue inhaler. She is compliant with her Symbicort and Singulair, Flonase and Pepcid.  Tests CXR 04/10/2016: IMPRESSION: 1. No acute cardiopulmonary process. 2. Interval resolution of previously noted patchy airspace opacities in the right upper and mid lung. 3.  Aortic Atherosclerosis (ICD10-170.0)  CXR 2/6 /2018 IMPRESSION: Areas of patchy infiltrate on the right consistent with pneumonia. Lungs elsewhere clear. Aortic atherosclerosis. Area of opacity in superior right middle lobe appears subtly nodular. Short interval follow-up advised in particular given this finding.  Labs 04/10/2016: Results for [name], [name] (MRN [id number]) as of 04/10/2016 19:32  Ref. Range 04/10/2016 [id number]  BASIC METABOLIC PANEL Unknown Rpt (A)  Sodium Latest Ref Range: 135 - 145 mEq/L 144  Potassium Latest Ref Range: 3.5 - 5.1 mEq/L 3.4 (L)  Chloride Latest Ref Range: 96 - 112 mEq/L 107  CO2 Latest Ref Range: 19 - 32 mEq/L 31  Glucose Latest Ref Range: 70 - 99 mg/dL 140 (H)    BUN Latest Ref Range: 6 - 23 mg/dL 15  Creatinine Latest Ref Range: 0.40 - 1.20 mg/dL 0.65  Calcium Latest Ref Range: 8.4 - 10.5 mg/dL 8.7  GFR Latest Ref Range: >60.00 mL/min 115.00  WBC Latest Ref Range: 4.0 - 10.5 K/uL 10.3  RBC Latest Ref Range: 3.87 - 5.11 Mil/uL 4.42  Hemoglobin Latest Ref Range: 12.0 - 15.0 g/dL 10.1 (L)  HCT Latest Ref Range: 36.0 - 46.0 % 31.8 (L)  MCV Latest Ref Range: 78.0 - 100.0 fl 71.9 (L)  MCHC Latest Ref Range: 30.0 - 36.0 g/dL 31.7  RDW Latest Ref Range: 11.5 - 15.5 % 17.3 (H)  Platelets Latest Ref Range: 150.0 - 400.0 K/uL 273.0  Neutrophils Latest Ref Range: 43.0 - 77.0 % 77.9 (H)  Lymphocytes Latest Ref Range: 12.0 - 46.0 % 17.6  Monocytes Relative Latest Ref Range: 3.0 - 12.0 % 4.0  Eosinophil Latest Ref Range: 0.0 - 5.0 % 0.1  Basophil Latest Ref Range: 0.0 - 3.0 % 0.4  NEUT# Latest Ref Range: 1.4 - 7.7 K/uL 8.1 (H)  Lymphocyte # Latest Ref Range: 0.7 - 4.0 K/uL 1.8  Monocyte # Latest Ref Range: 0.1 - 1.0 K/uL 0.4  Eosinophils Absolute Latest Ref Range: 0.0 - 0.7 K/uL 0.0  Basophils Absolute Latest Ref Range: 0.0 - 0.1 K/uL 0.0   Past medical hx Past Medical History:  Diagnosis Date  . Asthma   . GERD (gastroesophageal reflux disease)   . Heart murmur   . Pneumonia 03/28/2016     Past surgical hx, Family hx, Social hx all reviewed.  Current Outpatient Prescriptions on File Prior to Visit  Medication  Sig  . acetaminophen (TYLENOL) 325 MG tablet Take 325-650 mg by mouth every 6 (six) hours as needed for mild pain or headache.  . albuterol (PROVENTIL HFA;VENTOLIN HFA) 108 (90 Base) MCG/ACT inhaler Inhale 2 puffs into the lungs every 6 (six) hours as needed for wheezing.  Marland Kitchen albuterol (PROVENTIL) (2.5 MG/3ML) 0.083% nebulizer solution Take 3 mLs (2.5 mg total) by nebulization every 6 (six) hours as needed. asthma (Patient taking differently: Take 2.5 mg by nebulization every 6 (six) hours as needed for wheezing. )  . budesonide-formoterol  (SYMBICORT) 160-4.5 MCG/ACT inhaler Inhale 2 puffs into the lungs 2 (two) times daily.  . cefpodoxime (VANTIN) 200 MG tablet Take 1 tablet (200 mg total) by mouth 2 (two) times daily.  . famotidine (PEPCID) 20 MG tablet One at bedtime (Patient taking differently: Take 20 mg by mouth at bedtime. One at bedtime)  . fluticasone (FLONASE) 50 MCG/ACT nasal spray Place 2 sprays into the nose daily. (Patient taking differently: Place 2 sprays into both nostrils daily as needed for allergies. )  . methimazole (TAPAZOLE) 10 MG tablet Take 2 tablets (20 mg total) by mouth 2 (two) times daily.  . metoprolol tartrate (LOPRESSOR) 25 MG tablet Take 1 tablet (25 mg total) by mouth 2 (two) times daily.  . pantoprazole (PROTONIX) 40 MG tablet Take 1 tablet (40 mg total) by mouth daily before breakfast.  . predniSONE (DELTASONE) 5 MG tablet Label  & dispense according to the schedule below. 10 Pills PO for 3 days then, 8 Pills PO for 3 days, 6 Pills PO for 3 days, 4 Pills PO for 3 days, 2 Pills PO for 3 days, 1 Pills PO for 3 days, 1/2 Pill  PO for 3 days then STOP. Total 95 pills.   No current facility-administered medications on file prior to visit.      Allergies  Allergen Reactions  . Aspirin Shortness Of Breath    Review Of Systems:  Constitutional:   No  weight loss, night sweats,  Fevers, chills, fatigue, or  lassitude.  HEENT:   No headaches,  Difficulty swallowing,  Tooth/dental problems, or  Sore throat,                No sneezing, itching, ear ache, nasal congestion, post nasal drip,   CV:  No chest pain,  Orthopnea, PND, swelling in lower extremities, anasarca, dizziness, palpitations, syncope.   GI  No heartburn, indigestion, abdominal pain, nausea, vomiting, diarrhea, change in bowel habits, loss of appetite, bloody stools.   Resp: + shortness of breath with exertion not  at rest.  No excess mucus, no productive cough,  No non-productive cough,  No coughing up of blood.  No change in color of  mucus.  No wheezing.  No chest wall deformity  Skin: no rash or lesions.  GU: no dysuria, change in color of urine, no urgency or frequency.  No flank pain, no hematuria   MS:  No joint pain or swelling.  No decreased range of motion.  No back pain.  Psych:  No change in mood or affect. No depression or anxiety.  No memory loss.   Vital Signs BP (!) 142/62 (BP Location: Left Arm, Cuff Size: Normal)   Pulse 80   Ht 5' 9.75" (1.772 m)   Wt 164 lb (74.4 kg)   SpO2 98%   BMI 23.70 kg/m    Physical Exam:  General- No distress,  A&Ox3, pleasant ENT: No sinus tenderness, TM clear, pale nasal mucosa,  no oral exudate,no post nasal drip, no LAN Cardiac: S1, S2, regular rate and rhythm, no murmur Chest: Faint expiratory wheeze/ no rales/ dullness; no accessory muscle use, no nasal flaring, no sternal retractions Abd.: Soft Non-tender, flat bowel sounds positive Ext: No clubbing cyanosis, edema Neuro:  normal strength Skin: No rashes, warm and dry Psych: normal mood and behavior   Assessment/Plan  Asthma exacerbation Resolving Recent asthma exacerbation secondary to community-acquired pneumonia. Plan. Continue taking your prednisone as you have been doing until completed. Continue Symbicort 2 puffs twice daily as you have been doing. Rinse mouth after use. Continue Singulair 1 tablet at bedtime. We will renew this prescription for you. Follow up with Dr. Melvyn Novas in 3 months. Please contact office for sooner follow up if symptoms do not improve or worsen or seek emergency care  :    CAP (community acquired pneumonia) Resolved community-acquired pneumonia requiring hospitalization.( Chest x-ray 04/10/16 interval resolution of previously noted opacity. Treatment with IV antibiotics, IV steroids, scheduled bronchodilators, and supplemental oxygen. Aggressive pulmonary toilet with flutter valve Plan: We will check a CXR today We will draw labs ( BMET, CBC) We will call with  results. Continue taking your prednisone as you have been doing until completed. Follow up with Dr. Melvyn Novas in 3 months. Please contact office for sooner follow up if symptoms do not improve or worsen or seek emergency care  Addendum: Discussed with Dr. Melvyn Novas follow up CXR in 3 months as follow up to nodule.     Magdalen Spatz, NP 04/10/2016  7:44 PM

## 2016-04-11 NOTE — Progress Notes (Signed)
Chart and office note reviewed in detail along with available xrays/ labs > agree with a/p as outlined - infiltrates resolved c/w dx of cap/ not neoplasm

## 2016-04-18 ENCOUNTER — Telehealth: Payer: Self-pay | Admitting: Endocrinology

## 2016-04-18 ENCOUNTER — Ambulatory Visit (INDEPENDENT_AMBULATORY_CARE_PROVIDER_SITE_OTHER): Payer: PPO | Admitting: Endocrinology

## 2016-04-18 ENCOUNTER — Encounter: Payer: Self-pay | Admitting: Endocrinology

## 2016-04-18 VITALS — BP 130/68 | HR 60 | Ht 69.5 in | Wt 162.0 lb

## 2016-04-18 DIAGNOSIS — E0590 Thyrotoxicosis, unspecified without thyrotoxic crisis or storm: Secondary | ICD-10-CM | POA: Diagnosis not present

## 2016-04-18 LAB — T3, FREE: T3, Free: 3.7 pg/mL (ref 2.3–4.2)

## 2016-04-18 LAB — T4, FREE: FREE T4: 1.64 ng/dL — AB (ref 0.60–1.60)

## 2016-04-18 MED ORDER — METHIMAZOLE 10 MG PO TABS: 20.0000 mg | ORAL_TABLET | Freq: Two times a day (BID) | ORAL | 0 refills | Status: DC

## 2016-04-18 NOTE — Progress Notes (Signed)
Patient ID: [name], female   DOB: [birth year], 71 y.o.   MRN: [id number]                                                                                                                 Reason for Appointment:  Hyperthyroidism, new consultation  Referring physician: Dr. Candiss Norse   Chief complaint: Weight loss   History of Present Illness:   Patient had started losing weight last August and apparently had lost a total of about 30 pounds last year despite an increased appetite She did not discuss this with any physician Over the last couple of months also she has noticed that she is feeling excessively warm and sometimes sweaty.  Also has had tiredness. She does not think she has had significant symptoms of palpitations, shakiness, nervousness or change in bowel habits  In early February she was admitted to the hospital for shortness of breath and worsening of her asthma She was found to have significantly high pulse rate Screening for thyroid function showed that she had overt hyperthyroidism  She was started on methimazole 20 mg twice a day on her discharge on 03/31/16  Since her discharge patient has had stabilization of her weight which is improving and she does not feel any palpitations Also his not as tired Still feels excessively warm at times She just finished her prescription for methimazole last evening  Wt Readings from Last 3 Encounters:  04/18/16 162 lb (73.5 kg)  04/10/16 164 lb (74.4 kg)  03/31/16 159 lb 1.6 oz (72.2 kg)      Thyroid function tests as follows:     Lab Results  Component Value Date   FREET4 1.64 (H) 04/18/2016   FREET4 0.99 04/03/2007   T3FREE 3.7 04/18/2016   T3FREE 7.3 (H) 03/30/2016   T3FREE 2.1 (L) 04/03/2007    Lab Results  Component Value Date   TSH <0.010 (L) 03/29/2016     No results found for: THYROTRECAB     Allergies as of 04/18/2016      Reactions   Aspirin Shortness Of Breath      Medication List       Accurate  as of 04/18/16 11:31 AM. Always use your most recent med list.          acetaminophen 325 MG tablet Commonly known as:  TYLENOL Take 325-650 mg by mouth every 6 (six) hours as needed for mild pain or headache.   albuterol (2.5 MG/3ML) 0.083% nebulizer solution Commonly known as:  PROVENTIL Take 3 mLs (2.5 mg total) by nebulization every 6 (six) hours as needed. asthma   albuterol 108 (90 Base) MCG/ACT inhaler Commonly known as:  PROVENTIL HFA;VENTOLIN HFA Inhale 2 puffs into the lungs every 6 (six) hours as needed for wheezing.   budesonide-formoterol 160-4.5 MCG/ACT inhaler Commonly known as:  SYMBICORT Inhale 2 puffs into the lungs 2 (two) times daily.   cefpodoxime 200 MG tablet Commonly known as:  VANTIN Take 1 tablet (200 mg total) by mouth 2 (two)  times daily.   famotidine 20 MG tablet Commonly known as:  PEPCID One at bedtime   fluticasone 50 MCG/ACT nasal spray Commonly known as:  FLONASE Place 2 sprays into the nose daily.   methimazole 10 MG tablet Commonly known as:  TAPAZOLE Take 2 tablets (20 mg total) by mouth 2 (two) times daily.   metoprolol tartrate 25 MG tablet Commonly known as:  LOPRESSOR Take 1 tablet (25 mg total) by mouth 2 (two) times daily.   montelukast 10 MG tablet Commonly known as:  SINGULAIR Take 1 tablet (10 mg total) by mouth at bedtime.   pantoprazole 40 MG tablet Commonly known as:  PROTONIX Take 1 tablet (40 mg total) by mouth daily before breakfast.   predniSONE 5 MG tablet Commonly known as:  DELTASONE Label  & dispense according to the schedule below. 10 Pills PO for 3 days then, 8 Pills PO for 3 days, 6 Pills PO for 3 days, 4 Pills PO for 3 days, 2 Pills PO for 3 days, 1 Pills PO for 3 days, 1/2 Pill  PO for 3 days then STOP. Total 95 pills.           Past Medical History:  Diagnosis Date  . Asthma   . GERD (gastroesophageal reflux disease)   . Heart murmur   . Pneumonia 03/28/2016    Past Surgical History:    Procedure Laterality Date  . CATARACT EXTRACTION W/ INTRAOCULAR LENS  IMPLANT, BILATERAL  2011 &2009  . TONSILLECTOMY    . TUBAL LIGATION      Family History  Problem Relation Age of Onset  . Asthma Sister   . Emphysema Father     was a smoker  . Hypertension Mother   . Hypertension Sister     Social History:  reports that she has never smoked. She has never used smokeless tobacco. She reports that she drinks alcohol. She reports that she does not use drugs.  Allergies:  Allergies  Allergen Reactions  . Aspirin Shortness Of Breath      Review of Systems  Constitutional: Positive for weight loss.  HENT: Negative for trouble swallowing.   Eyes: Negative for visual disturbance.       No irritation or tearing of the eyes.  No swelling of the eyelids  Respiratory: Positive for shortness of breath.        She has periodic episodes of asthma, has had long-standing disease followed by the pulmonologist  Gastrointestinal: Negative for diarrhea.  Endocrine: Positive for heat intolerance. Negative for general weakness.  Genitourinary: Negative for frequency.  Musculoskeletal: Negative for muscle aches.  Skin: Negative for rash.  Neurological: Negative for weakness and numbness.  Psychiatric/Behavioral: Negative for nervousness.      Examination:   BP 130/68   Pulse 60   Ht 5' 9.5" (1.765 m)   Wt 162 lb (73.5 kg)   SpO2 96%   BMI 23.58 kg/m    General Appearance:  well-built and nourished, pleasant, not anxious or hyperkinetic.         Eyes: No  prominence, lid lag or stare. No swelling of the eyelids  Neck: The thyroid is enlarged 0.5-3 times normal on the right side, smooth, non-tender.  Isthmus also enlarged about twice normal Left lobe is mildly enlarged with a 2.5 cm smooth and soft nodule laterally There is no lymphadenopathy .          Heart: normal S1 and S2, no murmurs .  Lungs: breath sounds are clear bilaterally Abdomen: no hepatosplenomegaly or  other palpable abnormality  Extremities: hands are warm. No ankle edema. Neurological: Deep tendon reflexes at biceps and ankles are brisk. Bilateral fine tremors are present, very mild. Skin: No rash, abnormal thickening of the skin on legs or pigmentation seen     Assessment/Plan:   Hyperthyroidism, Likely to be from toxic nodular goiter   Discussed with the patient the toxic nodular goiter will need to be confirmed with doing a nuclear thyroid scan Most likely if she has either toxic nodular goiter or a hot nodule she will need I-131 treatment Unlikely that she has Graves' disease with her finding of the goiter which appears to be multinodular  Currently she is symptomatically doing better with recent dosage of 40 mg methimazole and metoprolol May still be mildly hyperthyroid as judged by her exam although pulse is relatively slow with taking metoprolol  Will need to recheck her thyroid functions including free T4 today to adjust the dose of methimazole Instructions given on stopping the methimazole for the nuclear studies and I-131 treatment She will also reduce her methimazole to once a day for now  Patient handout on hyperthyroidism and radioactive iodine treatment given Patient understands the above discussion and treatment options. All questions were answered satisfactorily  Patient Instructions  Please call tomorrow for instructions on dose of methimazole thyroid medication  When scheduled father radioactive iodine uptake test stopped the methimazole for 5 days before the test  We will resume the methimazole 48 hours after the actual radioactive iodine treatment if it is done  Reduce metoprolol to 1 tablet in the morning daily     Dominyck Reser 04/18/2016, 11:31 AM    ADDENDUM: Labs as follows, free T4 minimally increased, T3 is 3.7 She will reduce her dose to 30 mg daily of methimazole   Office Visit on 04/18/2016  Component Date Value Ref Range Status  . Free T4  04/18/2016 1.64* 0.60 - 1.60 ng/dL Final   Comment: Specimens from patients who are undergoing biotin therapy and /or ingesting biotin supplements may contain high levels of biotin.  The higher biotin concentration in these specimens interferes with this Free T4 assay.  Specimens that contain high levels  of biotin may cause false high results for this Free T4 assay.  Please interpret results in light of the total clinical presentation of the patient.    . T3, Free 04/18/2016 3.7  2.3 - 4.2 pg/mL Final

## 2016-04-18 NOTE — Patient Instructions (Signed)
Please call tomorrow for instructions on dose of methimazole thyroid medication  When scheduled father radioactive iodine uptake test stopped the methimazole for 5 days before the test  We will resume the methimazole 48 hours after the actual radioactive iodine treatment if it is done  Reduce metoprolol to 1 tablet in the morning daily

## 2016-04-18 NOTE — Telephone Encounter (Signed)
Please let her know.thyroid levels are nearly normal, reduce methimazole to 2 tablets in the morning and one in the evening instead of 2 tablets twice a day She can continue metoprolol once a day for 2 weeks and then stop

## 2016-04-20 NOTE — Telephone Encounter (Signed)
I contacted the patient and advised of instructions. Patient voiced understanding and had no further questions at this time.  

## 2016-05-01 ENCOUNTER — Ambulatory Visit (INDEPENDENT_AMBULATORY_CARE_PROVIDER_SITE_OTHER): Payer: PPO | Admitting: Nurse Practitioner

## 2016-05-01 ENCOUNTER — Encounter: Payer: Self-pay | Admitting: Nurse Practitioner

## 2016-05-01 VITALS — BP 162/94 | HR 78 | Temp 97.6°F | Ht 69.0 in | Wt 162.0 lb

## 2016-05-01 DIAGNOSIS — Z0000 Encounter for general adult medical examination without abnormal findings: Secondary | ICD-10-CM | POA: Diagnosis not present

## 2016-05-01 DIAGNOSIS — R030 Elevated blood-pressure reading, without diagnosis of hypertension: Secondary | ICD-10-CM

## 2016-05-01 NOTE — Progress Notes (Signed)
Subjective:    Patient ID: [name], female    DOB: September 28, 1945, 71 y.o.   MRN: [id number]  Patient presents today for establish care (new patient)  HPI   previous pcp: Dr. Jackson Latino with [facility], last seen 1years ago. Last CPE Spring 2017.  Asthma: Controlled with symbicort daily and albuterol prn Managed with pulmonology.  Hyperthyroidism: Managed by pulmologist Current use of methimazole and metoprolol.  Elevated Bp:  BP Readings from Last 3 Encounters:  05/01/16 (!) 162/94  04/18/16 130/68  04/10/16 (!) 142/62   Immunizations: (TDAP, Hep C screen, Pneumovax, Influenza, zoster)  Health Maintenance  Topic Date Due  .  Hepatitis C: One time screening is recommended by Center for Disease Control  (CDC) for  adults born from [birth year] through 1967.   May 10, 1945  . Mammogram  07/21/1993  . DEXA scan (bone density measurement)  07/21/2008  . Tetanus Vaccine  12/21/2008  . Pneumonia vaccines (2 of 2 - PCV13) 02/08/2012  . Colon Cancer Screening  11/28/2016  . Flu Shot  Completed   Diet:regular Weight:  Wt Readings from Last 3 Encounters:  05/01/16 162 lb (73.5 kg)  04/18/16 162 lb (73.5 kg)  04/10/16 164 lb (74.4 kg)   Exercise:walking Fall Risk: Fall Risk  05/01/2016  Falls in the past year? No   Home Safety:home with son and his family, Retired Depression/Suicide: Depression screen [facility] 2/9 05/01/2016  Decreased Interest 0  PHQ - 2 Score 0   No flowsheet data found. Advanced Directive: Advanced Directives 05/01/2016  Does Patient Have a Medical Advance Directive? No  Would patient like information on creating a medical advance directive? -  Pre-existing out of facility DNR order (yellow form or pink MOST form) -   Sexual History (birth control, marital status, STD):single, hoem with son and his family   Medications and allergies reviewed with patient and updated if appropriate.  Patient Active Problem List   Diagnosis Date Noted  . Solitary  pulmonary nodule 04/10/2016  . Influenza with pneumonia 03/29/2016  . Nonspecific abnormal electrocardiogram (ECG) (EKG) 03/29/2016  . CAP (community acquired pneumonia) 03/28/2016  . Asthma exacerbation 02/23/2015  . Polycythemia 02/23/2015  . Hyperglycemia 02/23/2015  . Asthma, chronic 05/04/2012  . Acute asthma exacerbation 03/31/2012  . Hypokalemia 03/31/2012    Current Outpatient Prescriptions on File Prior to Visit  Medication Sig Dispense Refill  . acetaminophen (TYLENOL) 325 MG tablet Take 325-650 mg by mouth every 6 (six) hours as needed for mild pain or headache.    . albuterol (PROVENTIL HFA;VENTOLIN HFA) 108 (90 Base) MCG/ACT inhaler Inhale 2 puffs into the lungs every 6 (six) hours as needed for wheezing. 1 Inhaler 1  . albuterol (PROVENTIL) (2.5 MG/3ML) 0.083% nebulizer solution Take 3 mLs (2.5 mg total) by nebulization every 6 (six) hours as needed. asthma (Patient taking differently: Take 2.5 mg by nebulization every 6 (six) hours as needed for wheezing. ) 75 mL 0  . budesonide-formoterol (SYMBICORT) 160-4.5 MCG/ACT inhaler Inhale 2 puffs into the lungs 2 (two) times daily. 1 Inhaler 12  . famotidine (PEPCID) 20 MG tablet One at bedtime (Patient taking differently: Take 20 mg by mouth at bedtime. One at bedtime)    . fluticasone (FLONASE) 50 MCG/ACT nasal spray Place 2 sprays into the nose daily. (Patient taking differently: Place 2 sprays into both nostrils daily as needed for allergies. ) 48 g 3  . methimazole (TAPAZOLE) 10 MG tablet Take 2 tablets (20 mg total) by mouth 2 (  two) times daily. 120 tablet 0  . metoprolol tartrate (LOPRESSOR) 25 MG tablet Take 1 tablet (25 mg total) by mouth 2 (two) times daily. 60 tablet 0  . montelukast (SINGULAIR) 10 MG tablet Take 1 tablet (10 mg total) by mouth at bedtime. 30 tablet 3  . pantoprazole (PROTONIX) 40 MG tablet Take 1 tablet (40 mg total) by mouth daily before breakfast. 90 tablet 4   No current facility-administered  medications on file prior to visit.     Past Medical History:  Diagnosis Date  . Asthma   . GERD (gastroesophageal reflux disease)   . Heart murmur   . Pneumonia 03/28/2016    Past Surgical History:  Procedure Laterality Date  . CATARACT EXTRACTION W/ INTRAOCULAR LENS  IMPLANT, BILATERAL  2011 &2009  . TONSILLECTOMY    . TUBAL LIGATION      Social History   Social History  . Marital status: Widowed    Spouse name: N/A  . Number of children: 1  . Years of education: N/A   Occupational History  . [employer]   Social History Main Topics  . Smoking status: Never Smoker  . Smokeless tobacco: Never Used  . Alcohol use Yes     Comment: maybe 1-2 times annually  . Drug use: No  . Sexual activity: Yes    Birth control/ protection: Other-see comments   Other Topics Concern  . None   Social History Narrative  . None    Family History  Problem Relation Age of Onset  . Asthma Sister   . Emphysema Father     was a smoker  . Hypertension Mother   . Hypertension Sister   . Thyroid disease Neg Hx   . Diabetes Neg Hx         Review of Systems  Constitutional: Negative for fever, malaise/fatigue and weight loss.  HENT: Negative for congestion and sore throat.   Eyes:       Negative for visual changes  Respiratory: Negative for cough and shortness of breath.   Cardiovascular: Negative for chest pain, palpitations and leg swelling.  Gastrointestinal: Negative for blood in stool, constipation, diarrhea and heartburn.  Genitourinary: Negative for dysuria, frequency and urgency.  Musculoskeletal: Negative for falls, joint pain and myalgias.  Skin: Negative for rash.  Neurological: Negative for dizziness, sensory change and headaches.  Endo/Heme/Allergies: Does not bruise/bleed easily.  Psychiatric/Behavioral: Negative for depression, memory loss, substance abuse and suicidal ideas. The patient is not nervous/anxious and does not have insomnia.      Objective:   Vitals:   05/01/16 1103  BP: (!) 162/94  Pulse: 78  Temp: 97.6 F (36.4 C)    Body mass index is 23.92 kg/m.   Physical Examination:  Physical Exam  Constitutional: She is oriented to person, place, and time and well-developed, well-nourished, and in no distress. No distress.  HENT:  Right Ear: External ear normal.  Left Ear: External ear normal.  Nose: Nose normal.  Mouth/Throat: Oropharynx is clear and moist. No oropharyngeal exudate.  Eyes: Conjunctivae and EOM are normal. Pupils are equal, round, and reactive to light. No scleral icterus.  Neck: Normal range of motion. Neck supple. Thyromegaly present.  Cardiovascular: Normal rate, normal heart sounds and intact distal pulses.   Pulmonary/Chest: Effort normal and breath sounds normal. She exhibits no tenderness.  Abdominal: Soft. Bowel sounds are normal. She exhibits no distension. There is no tenderness.  Musculoskeletal: Normal range of motion. She exhibits no edema or  tenderness.  Lymphadenopathy:    She has no cervical adenopathy.  Neurological: She is alert and oriented to person, place, and time. She has normal reflexes. Gait normal. Coordination normal.  Skin: Skin is warm and dry.  Psychiatric: Affect and judgment normal.  Vitals reviewed.   ASSESSMENT and PLAN:  Diagnoses and all orders for this visit:  Encounter for medical examination to establish care  Elevated BP without diagnosis of hypertension   No problem-specific Assessment & Plan notes found for this encounter.     Follow up: Return in about 3 months (around 08/01/2016) for CPE (fasting).  Wilfred Lacy, NP

## 2016-05-01 NOTE — Patient Instructions (Addendum)
Please schedule for AWV as well.  If you have medicare related insurance (such as traditional Medicare, [employer], [employer], or similar), Please make an appointment at the scheduling desk with Sharee Pimple, the [employer], for your Wellness visit in this office, which is a benefit with your insurance.   Please sign medical release to get records from previous pcp: Dr. Jackson Latino with [facility], last seen 1years ago.

## 2016-05-03 ENCOUNTER — Encounter (HOSPITAL_COMMUNITY)
Admission: RE | Admit: 2016-05-03 | Discharge: 2016-05-03 | Disposition: A | Discharge status: Home / Self Care | Payer: PPO | Source: Ambulatory Visit | Attending: Endocrinology | Admitting: Endocrinology

## 2016-05-03 ENCOUNTER — Encounter (HOSPITAL_COMMUNITY): Payer: Self-pay

## 2016-05-03 DIAGNOSIS — E0590 Thyrotoxicosis, unspecified without thyrotoxic crisis or storm: Secondary | ICD-10-CM

## 2016-05-04 ENCOUNTER — Encounter (HOSPITAL_COMMUNITY): Payer: PPO

## 2016-05-12 ENCOUNTER — Telehealth: Payer: Self-pay | Admitting: Endocrinology

## 2016-05-12 NOTE — Telephone Encounter (Signed)
She needs to have a thyroid uptake done for Hyperthyroidism evaluation and treatment, please call

## 2016-05-12 NOTE — Telephone Encounter (Signed)
Pre service called stated patient need a PA for thyroid uptake, insurance is health team advantage for her.  [phone]

## 2016-05-16 ENCOUNTER — Encounter (HOSPITAL_COMMUNITY): Payer: PPO | Attending: Endocrinology

## 2016-05-17 ENCOUNTER — Other Ambulatory Visit: Payer: Self-pay

## 2016-05-17 ENCOUNTER — Encounter (HOSPITAL_COMMUNITY): Payer: PPO

## 2016-05-17 NOTE — Telephone Encounter (Signed)
This has been resolved

## 2016-05-26 ENCOUNTER — Other Ambulatory Visit (INDEPENDENT_AMBULATORY_CARE_PROVIDER_SITE_OTHER): Payer: PPO

## 2016-05-26 DIAGNOSIS — E0590 Thyrotoxicosis, unspecified without thyrotoxic crisis or storm: Secondary | ICD-10-CM | POA: Diagnosis not present

## 2016-05-26 LAB — T3, FREE: T3 FREE: 5.4 pg/mL — AB (ref 2.3–4.2)

## 2016-05-26 LAB — T4, FREE: FREE T4: 1.77 ng/dL — AB (ref 0.60–1.60)

## 2016-05-30 ENCOUNTER — Ambulatory Visit: Payer: PPO | Admitting: Endocrinology

## 2016-06-07 ENCOUNTER — Encounter (HOSPITAL_COMMUNITY)
Admission: RE | Admit: 2016-06-07 | Discharge: 2016-06-07 | Disposition: A | Discharge status: Home / Self Care | Payer: PPO | Source: Ambulatory Visit | Attending: Endocrinology | Admitting: Endocrinology

## 2016-06-07 DIAGNOSIS — E0590 Thyrotoxicosis, unspecified without thyrotoxic crisis or storm: Secondary | ICD-10-CM | POA: Insufficient documentation

## 2016-06-07 MED ORDER — SODIUM IODIDE I 131 CAPSULE
7.5000 | Freq: Once | INTRAVENOUS | Status: AC | PRN
  Administered 2016-06-07: 7.5 via ORAL

## 2016-06-08 ENCOUNTER — Encounter (HOSPITAL_COMMUNITY)
Admission: RE | Admit: 2016-06-08 | Discharge: 2016-06-08 | Disposition: A | Discharge status: Home / Self Care | Payer: PPO | Source: Ambulatory Visit | Attending: Endocrinology | Admitting: Endocrinology

## 2016-06-08 DIAGNOSIS — E0590 Thyrotoxicosis, unspecified without thyrotoxic crisis or storm: Secondary | ICD-10-CM | POA: Diagnosis not present

## 2016-06-20 ENCOUNTER — Encounter: Payer: Self-pay | Admitting: Endocrinology

## 2016-06-20 ENCOUNTER — Telehealth: Payer: Self-pay | Admitting: Nurse Practitioner

## 2016-06-20 ENCOUNTER — Ambulatory Visit (INDEPENDENT_AMBULATORY_CARE_PROVIDER_SITE_OTHER): Payer: PPO | Admitting: Endocrinology

## 2016-06-20 VITALS — BP 144/80 | HR 76 | Ht 69.0 in | Wt 166.2 lb

## 2016-06-20 DIAGNOSIS — E0520 Thyrotoxicosis with toxic multinodular goiter without thyrotoxic crisis or storm: Secondary | ICD-10-CM | POA: Diagnosis not present

## 2016-06-20 NOTE — Telephone Encounter (Signed)
06/20/16 received 9 pages from [facility] , sent this to [facility].  PWR

## 2016-06-20 NOTE — Patient Instructions (Signed)
Methimazole stop 7 days prior to Rx and restart 3 days later

## 2016-06-20 NOTE — Progress Notes (Signed)
Patient ID: Lenna Hagarty, female   DOB: November 04, 1945, 71 y.o.   MRN: [id number]                                                                                                                 Reason for Appointment:  Hyperthyroidism, follow-up    Chief complaint: Follow-up of thyroid   History of Present Illness:   History obtained on patient's initial visit: Patient had started losing weight last August and apparently had lost a total of about 30 pounds last year despite an increased appetite She did not discuss this with any physician Over the last couple of months also she has noticed that she is feeling excessively warm and sometimes sweaty.  Also has had tiredness. She does not think she has had significant symptoms of palpitations, shakiness, nervousness or change in bowel habits  In early February she was admitted to the hospital for shortness of breath and worsening of her asthma She had a significantly high pulse rate and was found to be hyperthyroid She was started on methimazole 20 mg twice a day on her discharge on 03/31/16  Recent history: Although on her follow-up visit in the office in 2/18 she was told to take 30 mg a methimazole she is only taking 20 mg She also did not follow-up as directed last month She says she feels fairly good and does not feel short of breath, no palpitations and does not feel as tired or warm  Her thyroid scan shows hot and cold nodules and uptake of about 50%  Wt Readings from Last 3 Encounters:  06/20/16 166 lb 3.2 oz (75.4 kg)  05/01/16 162 lb (73.5 kg)  04/18/16 162 lb (73.5 kg)      Thyroid function tests as follows:     Lab Results  Component Value Date   FREET4 1.77 (H) 05/26/2016   FREET4 1.64 (H) 04/18/2016   FREET4 0.99 04/03/2007   T3FREE 5.4 (H) 05/26/2016   T3FREE 3.7 04/18/2016   T3FREE 7.3 (H) 03/30/2016   T3FREE 2.1 (L) 04/03/2007    Lab Results  Component Value Date   TSH <0.010 (L) 03/29/2016     No  results found for: THYROTRECAB     Allergies as of 06/20/2016      Reactions   Aspirin Shortness Of Breath      Medication List       Accurate as of 06/20/16  8:20 AM. Always use your most recent med list.          acetaminophen 325 MG tablet Commonly known as:  TYLENOL Take 325-650 mg by mouth every 6 (six) hours as needed for mild pain or headache.   albuterol (2.5 MG/3ML) 0.083% nebulizer solution Commonly known as:  PROVENTIL Take 3 mLs (2.5 mg total) by nebulization every 6 (six) hours as needed. asthma   albuterol 108 (90 Base) MCG/ACT inhaler Commonly known as:  PROVENTIL HFA;VENTOLIN HFA Inhale 2 puffs into the lungs every 6 (six) hours as needed for wheezing.  budesonide-formoterol 160-4.5 MCG/ACT inhaler Commonly known as:  SYMBICORT Inhale 2 puffs into the lungs 2 (two) times daily.   famotidine 20 MG tablet Commonly known as:  PEPCID One at bedtime   fluticasone 50 MCG/ACT nasal spray Commonly known as:  FLONASE Place 2 sprays into the nose daily.   methimazole 10 MG tablet Commonly known as:  TAPAZOLE Take 2 tablets (20 mg total) by mouth 2 (two) times daily.   metoprolol tartrate 25 MG tablet Commonly known as:  LOPRESSOR Take 1 tablet (25 mg total) by mouth 2 (two) times daily.   montelukast 10 MG tablet Commonly known as:  SINGULAIR Take 1 tablet (10 mg total) by mouth at bedtime.   pantoprazole 40 MG tablet Commonly known as:  PROTONIX Take 1 tablet (40 mg total) by mouth daily before breakfast.           Past Medical History:  Diagnosis Date  . Asthma   . GERD (gastroesophageal reflux disease)   . Heart murmur   . Pneumonia 03/28/2016    Past Surgical History:  Procedure Laterality Date  . CATARACT EXTRACTION W/ INTRAOCULAR LENS  IMPLANT, BILATERAL  2011 &2009  . TONSILLECTOMY    . TUBAL LIGATION      Family History  Problem Relation Age of Onset  . Asthma Sister   . Emphysema Father     was a smoker  . Hypertension  Mother   . Hypertension Sister   . Thyroid disease Neg Hx   . Diabetes Neg Hx     Social History:  reports that she has never smoked. She has never used smokeless tobacco. She reports that she drinks alcohol. She reports that she does not use drugs.  Allergies:  Allergies  Allergen Reactions  . Aspirin Shortness Of Breath      Review of Systems  No recent episodes of asthma   Examination:   BP (!) 144/80   Pulse 76   Ht 5\' 9"  (1.753 m)   Wt 166 lb 3.2 oz (75.4 kg)   SpO2 98%   BMI 24.54 kg/m    General Appearance:  well-built and nourished, pleasant, not anxious or hyperkinetic.         Eyes: No  Prominence   2 cm rt 3 cm mid 2.5 lt  The thyroid is enlarged and nodular as follows: Right lobe shows a 2 cm nodule laterally The isthmus is enlarged at least 3 cm extending somewhat to the right is Left lobe is mildly enlarged with a 2.5 cm smooth and relatively soft nodule laterally  No ankle edema. Neurological: Deep tendon reflexes at biceps are nearly normal Bilateral fine tremors are present, very mild.    Assessment/Plan:   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   Discussed with the patient That the treatment for her hyperthyroidism will be I-131 and explained to her how this would be done She will get 30 mCi based on the size of her goiter and uptake of about 50% Since she is not very symptomatic she can continue 20 mg of methimazole, she will need to stop this for a week before the treatment and resume 3 days later  We will have her scheduled for follow-up with labs in 6 weeks  There are no Patient Instructions on file for this visit.    Rannie Craney 06/20/2016, 8:20 AM

## 2016-06-30 ENCOUNTER — Encounter: Payer: Self-pay | Admitting: Nurse Practitioner

## 2016-06-30 DIAGNOSIS — K219 Gastro-esophageal reflux disease without esophagitis: Secondary | ICD-10-CM | POA: Insufficient documentation

## 2016-06-30 DIAGNOSIS — D649 Anemia, unspecified: Secondary | ICD-10-CM | POA: Insufficient documentation

## 2016-07-06 ENCOUNTER — Ambulatory Visit (HOSPITAL_COMMUNITY): Payer: PPO

## 2016-07-10 ENCOUNTER — Ambulatory Visit: Payer: Self-pay | Admitting: Internal Medicine

## 2016-07-13 ENCOUNTER — Ambulatory Visit (INDEPENDENT_AMBULATORY_CARE_PROVIDER_SITE_OTHER): Payer: PPO | Admitting: Internal Medicine

## 2016-07-13 ENCOUNTER — Encounter: Payer: Self-pay | Admitting: Internal Medicine

## 2016-07-13 VITALS — BP 142/66 | HR 114 | Ht 69.75 in | Wt 166.0 lb

## 2016-07-13 DIAGNOSIS — J4530 Mild persistent asthma, uncomplicated: Secondary | ICD-10-CM | POA: Diagnosis not present

## 2016-07-13 NOTE — Progress Notes (Signed)
Subjective:     Patient ID: [name], female   DOB: 1945-10-31    MRN: [id number]    Brief patient profile:  34 yobf remote minimal smoker quit completely in the 1970's with dtc asthma since 2007 eval for the first time by the pulmonary service during admit [facility] in July 2012 with prominent upper airway "wheezing" with essentially nl pft's 10/01/2012     History of Present Illness   Admit date: 03/31/2012  Discharge date: 04/05/2012  DISCHARGE DIAGNOSES:  Principal Problem:  Acute asthma exacerbation    05/20/2013 f/u ov/[name] re: chronic asthma  Chief Complaint  Patient presents with  . Asthma    Breathing is doing well. Reports slight wheeze at this time. Denies SOB, coughing, chest tightness.   no need for saba, Not limited by breathing from desired activities   rec Continue symbicort 160 Take 2 puffs first thing in am and then another 2 puffs about 12 hours later.  Only use your albuterol (ventolin) as a rescue medication  Admission date: 02/23/2015 Admitting Physician Waldemar Dickens, MD  Discharge Date: 02/27/2015   Admission Diagnosis Asthma exacerbation [J45.901]  Discharge Diagnosis Asthma exacerbation [J45.901]   Principal Problem:  Asthma exacerbation Active Problems:  Polycythemia  Hyperglycemia   03/29/2015 ext post hosp f/u ov/transition of care/[name] re: s/p hosp for asthma now maint on symb 160/ singulair /ppi Chief Complaint  Patient presents with  . HFU    Pt states her breathing is much improved, almost back to her normal baseline. She uses albuterol inhaler 1 x per wk on average and has not needed neb.    did not follow any of the action plans previously discussed/ was not on gerd rx with flare but was still apparently taking symbicort 160 2bid  rec Plan A = Symbicort 80 Take 2 puffs first thing in am and then another 2 puffs about 12 hours later.  Work on inhaler technique:  relax and gently blow all the way out then take a nice smooth deep  breath back in, triggering the inhaler at same time you start breathing in.  Hold for up to 5 seconds if you can. Blow out thru nose. Rinse and gargle with water when done Plan B = back -up Only use your albuterol (ventolin) as a rescue medication Plan C = Crisis Ok to use nebulizer if you must up to every 4 hours if needed  Pantoprazole (protonix) 40 mg   Take  30-60 min before first meal of the day and Pepcid (famotidine)  20 mg one @  bedtime until return to office - this is the best way to tell whether stomach acid is contributing to your problem.   GERD diet  Prednisone 10 mg take  4 each am x 2 days,   2 each am x 2 days,  1 each am x 2 days and stop      07/13/2016  f/u ov/[name] re:  Chronic asthma on symb 160 2bid and singulair and ppi/h2hs Chief Complaint  Patient presents with  . Follow-up    Breathing is doing well overall. No need to albuterol inhaler or neb.   Not limited by breathing from desired activities  Even doing steps ok   No obvious day to day or daytime variability or assoc excess/ purulent sputum or mucus plugs or hemoptysis or cp or chest tightness, subjective wheeze or overt sinus or hb symptoms. No unusual exp hx or h/o childhood pna/ asthma or knowledge of  premature birth.  Sleeping ok without nocturnal  or early am exacerbation  of respiratory  c/o's or need for noct saba. Also denies any obvious fluctuation of symptoms with weather or environmental changes or other aggravating or alleviating factors except as outlined above   Current Medications, Allergies, Complete Past Medical History, Past Surgical History, Family History, and Social History were reviewed in [employer] record.  ROS  The following are not active complaints unless bolded sore throat, dysphagia, dental problems, itching, sneezing,  nasal congestion or excess/ purulent secretions, ear ache,   fever, chills, sweats, unintended wt loss, classically pleuritic or exertional cp,   orthopnea pnd or leg swelling, presyncope, palpitations, abdominal pain, anorexia, nausea, vomiting, diarrhea  or change in bowel or bladder habits, change in stools or urine, dysuria,hematuria,  rash, arthralgias, visual complaints, headache, numbness, weakness or ataxia or problems with walking or coordination,  change in mood/affect or memory.                        Objective:   Physical Exam  amb bf nad / vital signs reviewed -  - Note on arrival 02 sats  99% on RA      wt   192  10/06/10 > 11/08/2010  192 > 02/08/2011 201> 05/09/2011  196> 08/09/2011  198 >11/13/2011 191 > 05/01/2012 188 > 186 08/20/2012 > 10/01/2012  190 > 05/20/2013 204 > 03/29/2015  206 > 05/28/2015  203 > 11/26/2015 171 > 12/16/2015  168 > 03/17/2016 160 > 07/13/2016  166    HEENT: nl dentition, turbinates, and orophanx. Nl external ear canals without cough reflex   NECK :  without JVD/Nodes/TM/ nl carotid upstrokes bilaterally   LUNGS: no acc muscle use,  Clear to A and P   CV:  RRR  no s3 or murmur or increase in P2, no edema   ABD:  soft and nontender with nl excursion in the supine position. No bruits or organomegaly, bowel sounds nl  MS:  warm without deformities, calf tenderness, cyanosis or clubbing  SKIN: warm and dry without lesions    NEURO:  alert, approp, no deficits       I personally reviewed images and agree with radiology impression as follows:  CXR:    04/10/16 1. No acute cardiopulmonary process. 2. Interval resolution of previously noted patchy airspace opacities in the right upper and mid lung.     Assessment:       Outpatient Encounter Prescriptions as of 07/13/2016  Medication Sig  . acetaminophen (TYLENOL) 325 MG tablet Take 325-650 mg by mouth every 6 (six) hours as needed for mild pain or headache.  . albuterol (PROVENTIL HFA;VENTOLIN HFA) 108 (90 Base) MCG/ACT inhaler Inhale 2 puffs into the lungs every 6 (six) hours as needed for wheezing.  Marland Kitchen albuterol (PROVENTIL) (2.5 MG/3ML) 0.083%  nebulizer solution Take 3 mLs (2.5 mg total) by nebulization every 6 (six) hours as needed. asthma (Patient taking differently: Take 2.5 mg by nebulization every 6 (six) hours as needed for wheezing. )  . budesonide-formoterol (SYMBICORT) 160-4.5 MCG/ACT inhaler Inhale 2 puffs into the lungs 2 (two) times daily.  . famotidine (PEPCID) 20 MG tablet One at bedtime (Patient taking differently: Take 20 mg by mouth at bedtime. One at bedtime)  . fluticasone (FLONASE) 50 MCG/ACT nasal spray Place 2 sprays into the nose daily. (Patient taking differently: Place 2 sprays into both nostrils daily as needed for allergies. )  . methimazole (  TAPAZOLE) 10 MG tablet Take 2 tablets (20 mg total) by mouth 2 (two) times daily.  . montelukast (SINGULAIR) 10 MG tablet Take 1 tablet (10 mg total) by mouth at bedtime.  . pantoprazole (PROTONIX) 40 MG tablet Take 1 tablet (40 mg total) by mouth daily before breakfast.  . [DISCONTINUED] metoprolol tartrate (LOPRESSOR) 25 MG tablet Take 1 tablet (25 mg total) by mouth 2 (two) times daily. (Patient not taking: Reported on 06/20/2016)   No facility-administered encounter medications on file as of 07/13/2016.

## 2016-07-13 NOTE — Patient Instructions (Addendum)
No change in medications    Please schedule a follow up visit in 6  months but call sooner if needed  

## 2016-07-16 NOTE — Assessment & Plan Note (Signed)
-  Sinus CT 09/12/10 Previous bilateral nasoantral windows. Mild mucosal inflammation,  particularly notable in the maxillary, ethmoid and sphenoid  sinuses. No advanced sinusitis or free fluid. - Barium swallow nl 09/12/10 - PFTs 08/09/2011 FEV1  1.24 (50%) ratio 68 and no change p B2, DLCO 67 improves to 133 c/w minimal airflow obstruction - PFT's 10/01/2012 FEV2  1.63 (72%) ratio 76 and no change p B2, DLCO 76 improvede to 105   - PFT's  05/28/2015  FEV1 1.60  (71% ) ratio 75  p 6 % improvement from saba p nothing x 12 h prior to study with DLCO  65 % corrects to 93 % for alv volume   07/13/2016  After extensive coaching HFA effectiveness =    90%    All goals of chronic asthma control met including optimal function and elimination of symptoms with minimal need for rescue therapy.  Contingencies discussed in full including contacting this office immediately if not controlling the symptoms using the rule of two's.     Each maintenance medication was reviewed in detail including most importantly the difference between maintenance and as needed and under what circumstances the prns are to be used.  Please see AVS for specific  Instructions which are unique to this visit and I personally typed out  which were reviewed in detail in writing with the patient and a copy provided.

## 2016-07-19 ENCOUNTER — Telehealth: Payer: Self-pay | Admitting: Endocrinology

## 2016-07-19 NOTE — Telephone Encounter (Signed)
[name], This appointment was scheduled by deborah. Could you check and see if the authorization has been received for the RAI treatment?

## 2016-07-19 NOTE — Telephone Encounter (Signed)
If Neoma Laming scheduled it, she would know, I would contact her, it looks like she is a Community education officer pt, I do [facility] referrals, thanks!

## 2016-07-19 NOTE — Telephone Encounter (Signed)
Melanie from [place] called to get a prior authorization for the patient before their procedure tomorrow. Please call back this morning to advise.

## 2016-07-20 ENCOUNTER — Encounter (HOSPITAL_COMMUNITY): Payer: PPO

## 2016-07-20 NOTE — Telephone Encounter (Signed)
Melanie from [place] called to check the status of the prior authorization. I advised that it is actively being worked on and the nurse has been checking with the referral coordinator and is waiting on the approval. Please update once this is completed.

## 2016-07-28 ENCOUNTER — Other Ambulatory Visit (INDEPENDENT_AMBULATORY_CARE_PROVIDER_SITE_OTHER): Payer: PPO

## 2016-07-28 DIAGNOSIS — E0520 Thyrotoxicosis with toxic multinodular goiter without thyrotoxic crisis or storm: Secondary | ICD-10-CM | POA: Diagnosis not present

## 2016-07-28 LAB — T4, FREE: FREE T4: 4.4 ng/dL — AB (ref 0.60–1.60)

## 2016-07-28 LAB — T3, FREE: T3, Free: 10.9 pg/mL — ABNORMAL HIGH (ref 2.3–4.2)

## 2016-08-02 ENCOUNTER — Encounter: Payer: Self-pay | Admitting: Endocrinology

## 2016-08-02 ENCOUNTER — Ambulatory Visit (INDEPENDENT_AMBULATORY_CARE_PROVIDER_SITE_OTHER): Payer: PPO | Admitting: Endocrinology

## 2016-08-02 VITALS — BP 144/72 | HR 92 | Ht 69.0 in | Wt 163.4 lb

## 2016-08-02 DIAGNOSIS — E0520 Thyrotoxicosis with toxic multinodular goiter without thyrotoxic crisis or storm: Secondary | ICD-10-CM

## 2016-08-02 MED ORDER — METHIMAZOLE 10 MG PO TABS: 20.0000 mg | ORAL_TABLET | Freq: Two times a day (BID) | ORAL | 1 refills | Status: DC

## 2016-08-02 NOTE — Patient Instructions (Addendum)
Please get the prescription for methimazole from [facility]  Take 2 tablets of methimazole twice a day with food  Schedule your radioactive iodine treatment with a nuclear medicine Department for about 3 weeks from now  You will need to stop the methimazole about 5-7 days before the actual treatment date  resume the methimazole 48 hours after the  radioactive iodine treatment and stay on the same doses until you're next visit

## 2016-08-02 NOTE — Progress Notes (Signed)
Patient ID: [name], female   DOB: May 30, 1945, 71 y.o.   MRN: [id number]                                                                                                                 Reason for Appointment:  Hyperthyroidism, follow-up    Chief complaint: Follow-up of thyroid   History of Present Illness:   History obtained on patient's initial visit: Patient had started losing weight last August and apparently had lost a total of about 30 pounds last year despite an increased appetite She did not discuss this with any physician Over the last couple of months also she has noticed that she is feeling excessively warm and sometimes sweaty.  Also has had tiredness. She does not think she has had significant symptoms of palpitations, shakiness, nervousness or change in bowel habits  In early February she was admitted to the hospital for shortness of breath and worsening of her asthma She had a significantly high pulse rate and was found to be hyperthyroid She was started on methimazole 20 mg twice a day on her discharge on 03/31/16  Recent history: Although she was taking methimazole 20 mg as directed on her last visit she ran out about a week ago and did not call for refill Also she was supposed to have her I-131 treatment last month but she canceled this since she was concerned that she will need to be in quarantine  for 4 days  Currently she is having some increase frequency of bowel movements postprandially, does feel tired and has some shakiness but no palpitations  Her thyroid scan shows hot and cold nodules and uptake of 54%  Her thyroid levels are significantly higher especially T3  Wt Readings from Last 3 Encounters:  08/02/16 163 lb 6.4 oz (74.1 kg)  07/13/16 166 lb (75.3 kg)  06/20/16 166 lb 3.2 oz (75.4 kg)      Thyroid function tests as follows:     Lab Results  Component Value Date   FREET4 4.40 (H) 07/28/2016   FREET4 1.77 (H) 05/26/2016   FREET4 1.64 (H)  04/18/2016   FREET4 0.99 04/03/2007   T3FREE 10.9 (H) 07/28/2016   T3FREE 5.4 (H) 05/26/2016   T3FREE 3.7 04/18/2016   T3FREE 7.3 (H) 03/30/2016    Lab Results  Component Value Date   TSH <0.010 (L) 03/29/2016     No results found for: THYROTRECAB     Allergies as of 08/02/2016      Reactions   Aspirin Shortness Of Breath      Medication List       Accurate as of 08/02/16  8:35 AM. Always use your most recent med list.          acetaminophen 325 MG tablet Commonly known as:  TYLENOL Take 325-650 mg by mouth every 6 (six) hours as needed for mild pain or headache.   albuterol (2.5 MG/3ML) 0.083% nebulizer solution Commonly known as:  PROVENTIL Take 3 mLs (2.5 mg total) by  nebulization every 6 (six) hours as needed. asthma   albuterol 108 (90 Base) MCG/ACT inhaler Commonly known as:  PROVENTIL HFA;VENTOLIN HFA Inhale 2 puffs into the lungs every 6 (six) hours as needed for wheezing.   budesonide-formoterol 160-4.5 MCG/ACT inhaler Commonly known as:  SYMBICORT Inhale 2 puffs into the lungs 2 (two) times daily.   famotidine 20 MG tablet Commonly known as:  PEPCID One at bedtime   fluticasone 50 MCG/ACT nasal spray Commonly known as:  FLONASE Place 2 sprays into the nose daily.   methimazole 10 MG tablet Commonly known as:  TAPAZOLE Take 2 tablets (20 mg total) by mouth 2 (two) times daily.   montelukast 10 MG tablet Commonly known as:  SINGULAIR Take 1 tablet (10 mg total) by mouth at bedtime.   pantoprazole 40 MG tablet Commonly known as:  PROTONIX Take 1 tablet (40 mg total) by mouth daily before breakfast.           Past Medical History:  Diagnosis Date  . Asthma   . GERD (gastroesophageal reflux disease)   . Heart murmur   . Pneumonia 03/28/2016    Past Surgical History:  Procedure Laterality Date  . CATARACT EXTRACTION W/ INTRAOCULAR LENS  IMPLANT, BILATERAL  2011 &2009  . TONSILLECTOMY    . TUBAL LIGATION      Family History    Problem Relation Age of Onset  . Asthma Sister   . Emphysema Father        was a smoker  . Hypertension Mother   . Hypertension Sister   . Thyroid disease Neg Hx   . Diabetes Neg Hx     Social History:  reports that she has never smoked. She has never used smokeless tobacco. She reports that she drinks alcohol. She reports that she does not use drugs.  Allergies:  Allergies  Allergen Reactions  . Aspirin Shortness Of Breath      Review of Systems  No recent episodes of asthma   Examination:   BP (!) 144/72   Pulse 92   Ht 5\' 9"  (1.753 m)   Wt 163 lb 6.4 oz (74.1 kg)   SpO2 97%   BMI 24.13 kg/m   She is not hyperkinetic or anxious         Eyes: Appear normal externally  The thyroid is enlarged and nodular as follows: Right lobe is enlarged about 3 times normal, lobular The isthmus is enlarged with about a 2.5 cm nodule  Left lobe is mildly enlarged with a 2.5 cm smooth and relatively soft nodule laterally Neurological: Deep tendon reflexes at biceps are slightly brisk Bilateral fine tremors are minimally present    Assessment/Plan:   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    [name] 08/02/2016, 8:35 AM

## 2016-08-04 ENCOUNTER — Other Ambulatory Visit (INDEPENDENT_AMBULATORY_CARE_PROVIDER_SITE_OTHER): Payer: PPO

## 2016-08-04 ENCOUNTER — Encounter: Payer: Self-pay | Admitting: Nurse Practitioner

## 2016-08-04 ENCOUNTER — Ambulatory Visit (INDEPENDENT_AMBULATORY_CARE_PROVIDER_SITE_OTHER): Payer: PPO | Admitting: Nurse Practitioner

## 2016-08-04 VITALS — BP 142/74 | HR 86 | Temp 98.5°F | Ht 69.0 in | Wt 162.0 lb

## 2016-08-04 DIAGNOSIS — Z1231 Encounter for screening mammogram for malignant neoplasm of breast: Secondary | ICD-10-CM

## 2016-08-04 DIAGNOSIS — D649 Anemia, unspecified: Secondary | ICD-10-CM | POA: Diagnosis not present

## 2016-08-04 DIAGNOSIS — R739 Hyperglycemia, unspecified: Secondary | ICD-10-CM | POA: Diagnosis not present

## 2016-08-04 DIAGNOSIS — Z136 Encounter for screening for cardiovascular disorders: Secondary | ICD-10-CM | POA: Diagnosis not present

## 2016-08-04 DIAGNOSIS — Z0000 Encounter for general adult medical examination without abnormal findings: Secondary | ICD-10-CM | POA: Diagnosis not present

## 2016-08-04 DIAGNOSIS — Z1159 Encounter for screening for other viral diseases: Secondary | ICD-10-CM

## 2016-08-04 DIAGNOSIS — Z23 Encounter for immunization: Secondary | ICD-10-CM | POA: Diagnosis not present

## 2016-08-04 DIAGNOSIS — Z0001 Encounter for general adult medical examination with abnormal findings: Secondary | ICD-10-CM

## 2016-08-04 DIAGNOSIS — Z1239 Encounter for other screening for malignant neoplasm of breast: Secondary | ICD-10-CM

## 2016-08-04 DIAGNOSIS — Z13220 Encounter for screening for lipoid disorders: Secondary | ICD-10-CM

## 2016-08-04 DIAGNOSIS — R748 Abnormal levels of other serum enzymes: Secondary | ICD-10-CM

## 2016-08-04 DIAGNOSIS — Z780 Asymptomatic menopausal state: Secondary | ICD-10-CM | POA: Diagnosis not present

## 2016-08-04 DIAGNOSIS — H9193 Unspecified hearing loss, bilateral: Secondary | ICD-10-CM | POA: Diagnosis not present

## 2016-08-04 LAB — LIPID PANEL
CHOLESTEROL: 107 mg/dL (ref 0–200)
HDL: 45 mg/dL (ref >39.00)
LDL CALC: 47 mg/dL (ref 0–99)
NonHDL: 61.73
TRIGLYCERIDES: 76 mg/dL (ref 0.0–149.0)
Total CHOL/HDL Ratio: 2
VLDL: 15.2 mg/dL (ref 0.0–40.0)

## 2016-08-04 LAB — COMPREHENSIVE METABOLIC PANEL
ALBUMIN: 3.5 g/dL (ref 3.5–5.2)
ALT: 19 U/L (ref 0–35)
AST: 22 U/L (ref 0–37)
Alkaline Phosphatase: 210 U/L — ABNORMAL HIGH (ref 39–117)
BUN: 17 mg/dL (ref 6–23)
CALCIUM: 9.6 mg/dL (ref 8.4–10.5)
CHLORIDE: 108 meq/L (ref 96–112)
CO2: 28 mEq/L (ref 19–32)
CREATININE: 0.69 mg/dL (ref 0.40–1.20)
GFR: 107.24 mL/min (ref >60.00)
Glucose, Bld: 102 mg/dL — ABNORMAL HIGH (ref 70–99)
Potassium: 3.8 mEq/L (ref 3.5–5.1)
Sodium: 142 mEq/L (ref 135–145)
Total Bilirubin: 0.5 mg/dL (ref 0.2–1.2)
Total Protein: 6.4 g/dL (ref 6.0–8.3)

## 2016-08-04 LAB — CBC WITH DIFFERENTIAL/PLATELET
BASOS PCT: 1 % (ref 0.0–3.0)
Basophils Absolute: 0.1 10*3/uL (ref 0.0–0.1)
EOS PCT: 9.7 % — AB (ref 0.0–5.0)
Eosinophils Absolute: 0.6 10*3/uL (ref 0.0–0.7)
HEMATOCRIT: 31.5 % — AB (ref 36.0–46.0)
HEMOGLOBIN: 9.9 g/dL — AB (ref 12.0–15.0)
LYMPHS PCT: 31.2 % (ref 12.0–46.0)
Lymphs Abs: 1.9 10*3/uL (ref 0.7–4.0)
MCHC: 31.5 g/dL (ref 30.0–36.0)
MCV: 73.3 fl — ABNORMAL LOW (ref 78.0–100.0)
Monocytes Absolute: 0.7 10*3/uL (ref 0.1–1.0)
Monocytes Relative: 11.6 % (ref 3.0–12.0)
Neutro Abs: 2.9 10*3/uL (ref 1.4–7.7)
Neutrophils Relative %: 46.5 % (ref 43.0–77.0)
Platelets: 218 10*3/uL (ref 150.0–400.0)
RBC: 4.3 Mil/uL (ref 3.87–5.11)
RDW: 15.6 % — AB (ref 11.5–15.5)
WBC: 6.2 10*3/uL (ref 4.0–10.5)

## 2016-08-04 LAB — HEMOGLOBIN A1C: Hgb A1c MFr Bld: 6.2 % (ref 4.6–6.5)

## 2016-08-04 NOTE — Patient Instructions (Signed)
Go to basement for blood draw.  You will be contacted to schedule mammogram and audiology appt.  Follow up with Dr. Dwyane Dee about thyroid Ablation.

## 2016-08-04 NOTE — Progress Notes (Signed)
Subjective:    Patient ID: [name], female    DOB: 1945/11/27, 71 y.o.   MRN: [id number]  Patient presents today for complete physical.  HPI  Denies any acute complains.  Hyperthyriodism: Scheduled thyroid ablation. Persistent weight loss and increased BM despite use of methimazole and increased food intake. No palpitations or tremors.  Immunizations: (TDAP, Hep C screen, Pneumovax, Influenza, zoster)  Health Maintenance  Topic Date Due  .  Hepatitis C: One time screening is recommended by Center for Disease Control  (CDC) for  adults born from [birth year] through 1967.   [birth year]  . Mammogram  07/21/1993  . DEXA scan (bone density measurement)  07/21/2008  . Tetanus Vaccine  12/21/2008  . Pneumonia vaccines (2 of 2 - PCV13) 02/08/2012  . Flu Shot  09/20/2016  . Colon Cancer Screening  11/28/2016   Diet:regular.  Weight:  Wt Readings from Last 3 Encounters:  08/04/16 162 lb (73.5 kg)  08/02/16 163 lb 6.4 oz (74.1 kg)  07/13/16 166 lb (75.3 kg)   Exercise:none.  Fall Risk:no falls Fall Risk  08/04/2016 05/01/2016  Falls in the past year? No No   Home Safety:home with son and his family.  Depression/Suicide:denies Depression screen [facility] 2/9 08/04/2016 05/01/2016  Decreased Interest 0 0  Down, Depressed, Hopeless 0 -  PHQ - 2 Score 0 0   No flowsheet data found. Colonoscopy (every 5-49yrs, >50-54yrs):up to date.  Dexa (every 2-18yrs, >63yrs):needed.  Mammogram (yearly, >58yrs):needed.  Vision:needed.  Dental:needed.  Advanced Directive: Advanced Directives 05/01/2016  Does Patient Have a Medical Advance Directive? No  Would patient like information on creating a medical advance directive? -  Pre-existing out of facility DNR order (yellow form or pink MOST form) -   Medications and allergies reviewed with patient and updated if appropriate.  Patient Active Problem List   Diagnosis Date Noted  . Anemia 06/30/2016  . GERD (gastroesophageal reflux disease)  06/30/2016  . Elevated BP without diagnosis of hypertension 05/01/2016  . Solitary pulmonary nodule 04/10/2016  . Influenza with pneumonia 03/29/2016  . Nonspecific abnormal electrocardiogram (ECG) (EKG) 03/29/2016  . CAP (community acquired pneumonia) 03/28/2016  . Asthma exacerbation 02/23/2015  . Polycythemia 02/23/2015  . Hyperglycemia 02/23/2015  . Asthma, chronic 05/04/2012  . Acute asthma exacerbation 03/31/2012  . Hypokalemia 03/31/2012    Current Outpatient Prescriptions on File Prior to Visit  Medication Sig Dispense Refill  . acetaminophen (TYLENOL) 325 MG tablet Take 325-650 mg by mouth every 6 (six) hours as needed for mild pain or headache.    . albuterol (PROVENTIL HFA;VENTOLIN HFA) 108 (90 Base) MCG/ACT inhaler Inhale 2 puffs into the lungs every 6 (six) hours as needed for wheezing. 1 Inhaler 1  . albuterol (PROVENTIL) (2.5 MG/3ML) 0.083% nebulizer solution Take 3 mLs (2.5 mg total) by nebulization every 6 (six) hours as needed. asthma 75 mL 0  . budesonide-formoterol (SYMBICORT) 160-4.5 MCG/ACT inhaler Inhale 2 puffs into the lungs 2 (two) times daily. 1 Inhaler 12  . famotidine (PEPCID) 20 MG tablet One at bedtime (Patient taking differently: Take 20 mg by mouth at bedtime. One at bedtime)    . fluticasone (FLONASE) 50 MCG/ACT nasal spray Place 2 sprays into the nose daily. (Patient taking differently: Place 2 sprays into both nostrils daily as needed for allergies. ) 48 g 3  . methimazole (TAPAZOLE) 10 MG tablet Take 2 tablets (20 mg total) by mouth 2 (two) times daily. 120 tablet 1  . montelukast (SINGULAIR) 10 MG tablet Take  1 tablet (10 mg total) by mouth at bedtime. 30 tablet 3  . pantoprazole (PROTONIX) 40 MG tablet Take 1 tablet (40 mg total) by mouth daily before breakfast. 90 tablet 4   No current facility-administered medications on file prior to visit.     Past Medical History:  Diagnosis Date  . Asthma   . GERD (gastroesophageal reflux disease)   .  Heart murmur   . Pneumonia 03/28/2016    Past Surgical History:  Procedure Laterality Date  . CATARACT EXTRACTION W/ INTRAOCULAR LENS  IMPLANT, BILATERAL  2011 &2009  . TONSILLECTOMY    . TUBAL LIGATION      Social History   Social History  . Marital status: Widowed    Spouse name: N/A  . Number of children: 1  . Years of education: N/A   Occupational History  . [employer]   Social History Main Topics  . Smoking status: Never Smoker  . Smokeless tobacco: Never Used  . Alcohol use Yes     Comment: maybe 1-2 times annually  . Drug use: No  . Sexual activity: Not Currently    Birth control/ protection: None   Other Topics Concern  . None   Social History Narrative  . None    Family History  Problem Relation Age of Onset  . Asthma Sister   . Emphysema Father        was a smoker  . Hypertension Mother   . Hypertension Sister   . Thyroid disease Neg Hx   . Diabetes Neg Hx         Review of Systems  Constitutional: Positive for malaise/fatigue and weight loss. Negative for fever.  HENT: Positive for hearing loss. Negative for congestion and sore throat.   Eyes:       Negative for visual changes  Respiratory: Negative for cough and shortness of breath.   Cardiovascular: Negative for chest pain, palpitations and leg swelling.  Gastrointestinal: Positive for diarrhea. Negative for blood in stool, constipation and heartburn.  Genitourinary: Negative for dysuria, frequency and urgency.  Musculoskeletal: Negative for falls, joint pain and myalgias.  Skin: Negative for rash.  Neurological: Negative for dizziness, sensory change and headaches.  Endo/Heme/Allergies: Does not bruise/bleed easily.  Psychiatric/Behavioral: Negative for depression, memory loss, substance abuse and suicidal ideas. The patient is not nervous/anxious and does not have insomnia.     Objective:   Vitals:   08/04/16 0807  BP: (!) 142/74  Pulse: 86  Temp: 98.5 F (36.9 C)      Body mass index is 23.92 kg/m.   Physical Examination:  Physical Exam  Constitutional: She is oriented to person, place, and time and well-developed, well-nourished, and in no distress. No distress.  HENT:  Right Ear: External ear normal.  Left Ear: External ear normal.  Nose: Nose normal.  Mouth/Throat: Oropharynx is clear and moist. No oropharyngeal exudate.  Eyes: Conjunctivae and EOM are normal. Pupils are equal, round, and reactive to light. No scleral icterus.  Neck: Normal range of motion. Neck supple. No thyromegaly present.  Cardiovascular: Normal rate, normal heart sounds and intact distal pulses.   Pulmonary/Chest: Effort normal and breath sounds normal. She exhibits no tenderness.  Abdominal: Soft. Bowel sounds are normal. She exhibits no distension. There is no tenderness.  Musculoskeletal: Normal range of motion. She exhibits no edema or tenderness.  Lymphadenopathy:    She has no cervical adenopathy.  Neurological: She is alert and oriented to person, place, and time. Gait  normal.  Skin: Skin is warm and dry.  Psychiatric: Affect and judgment normal.    ASSESSMENT and PLAN:  Junia was seen today for annual exam.  Diagnoses and all orders for this visit:  Encounter for preventative adult health care examination -     CBC w/Diff; Future -     Comprehensive metabolic panel; Future -     Lipid panel; Future -     Hepatitis C Antibody; Future  Anemia, unspecified type -     CBC w/Diff; Future  Hyperglycemia -     Hemoglobin A1c; Future  Encounter for hepatitis C screening test for low risk patient -     Hepatitis C Antibody; Future  Encounter for lipid screening for cardiovascular disease -     Lipid panel; Future  Bilateral hearing loss, unspecified hearing loss type -     Ambulatory referral to Audiology  Breast cancer screening -     MM Digital Screening; Future  Need for diphtheria-tetanus-pertussis (Tdap) vaccine -     Tdap vaccine  greater than or equal to 7yo IM  Need for vaccination with 13-polyvalent pneumococcal conjugate vaccine -     Pneumococcal conjugate vaccine 13-valent IM   No problem-specific Assessment & Plan notes found for this encounter.     Follow up: Return in about 3 months (around 11/04/2016) for weight loss.Wilfred Lacy, NP

## 2016-08-05 LAB — HEPATITIS C ANTIBODY: HCV Ab: NEGATIVE

## 2016-08-20 IMAGING — DX DG KNEE COMPLETE 4+V*R*
4 series · 4 of 4 positions shown · non-contrast
Comparison: None.

CLINICAL DATA: Right knee pain after injury. Injury tonight playing
Paulus N. Lateral pain.

EXAM:
RIGHT KNEE - COMPLETE 4+ VIEW

[knee ap]
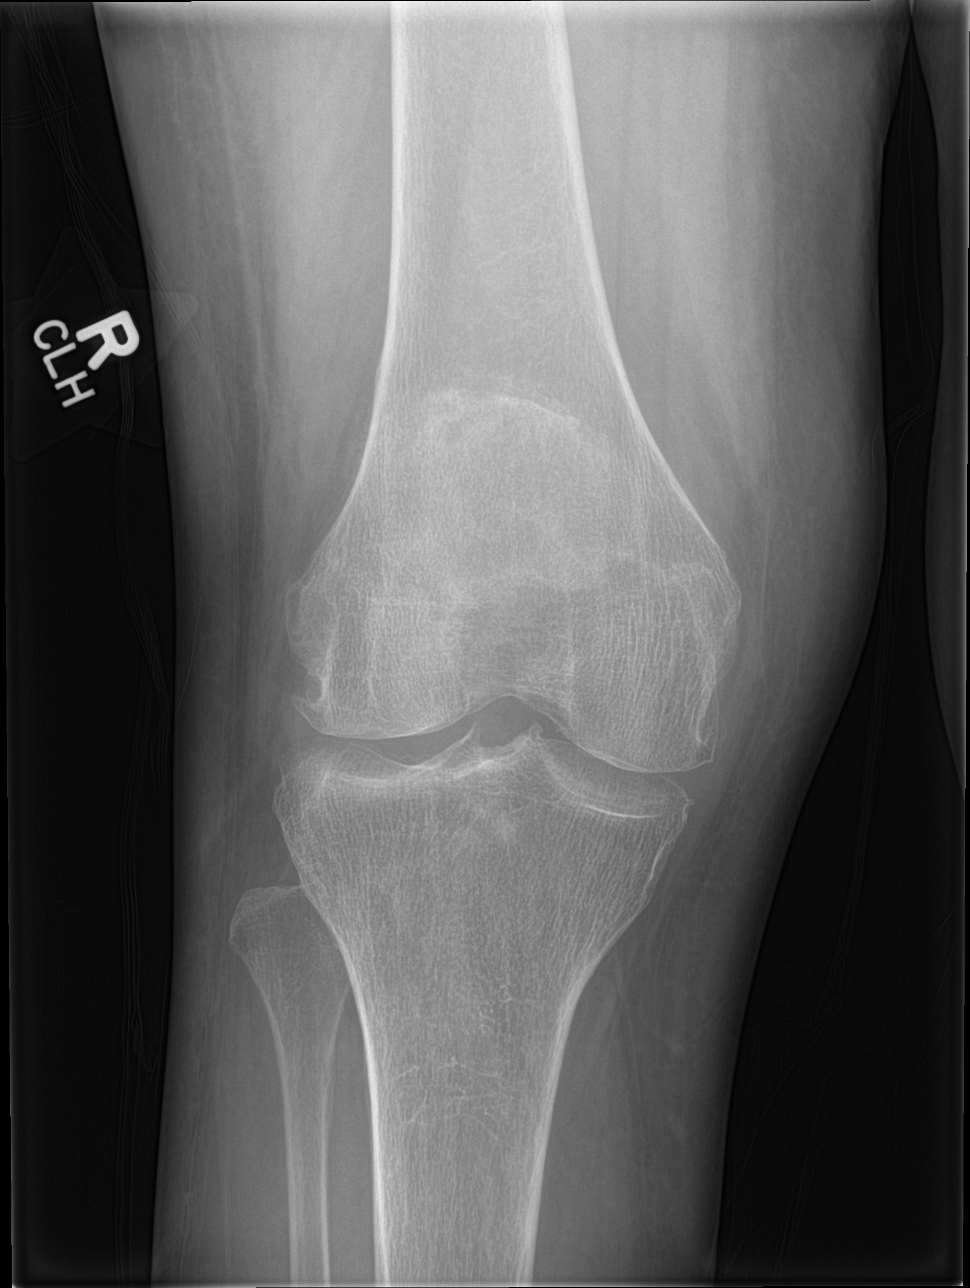

[knee obl (1 of 2)]
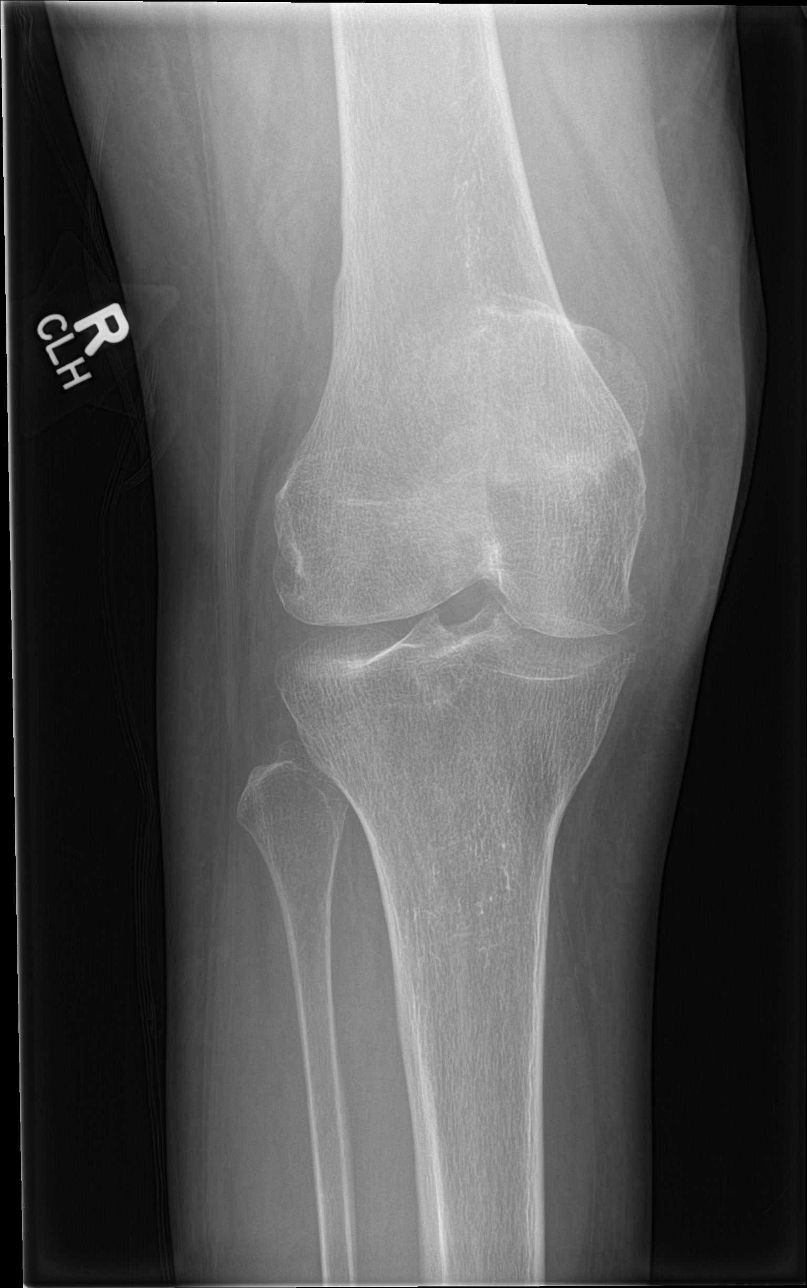

[knee obl (2 of 2)]
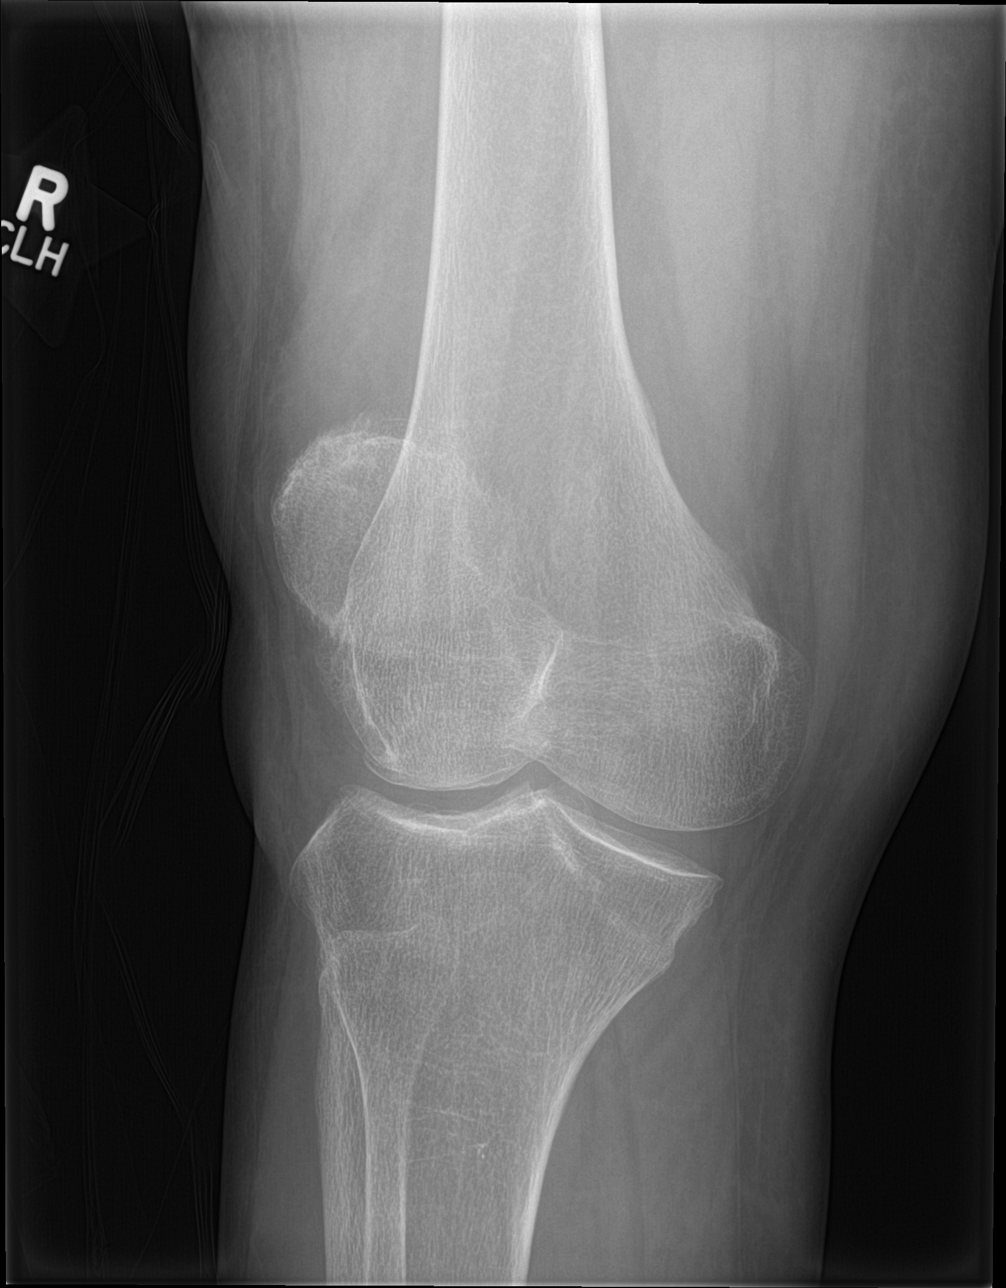

[knee lat]
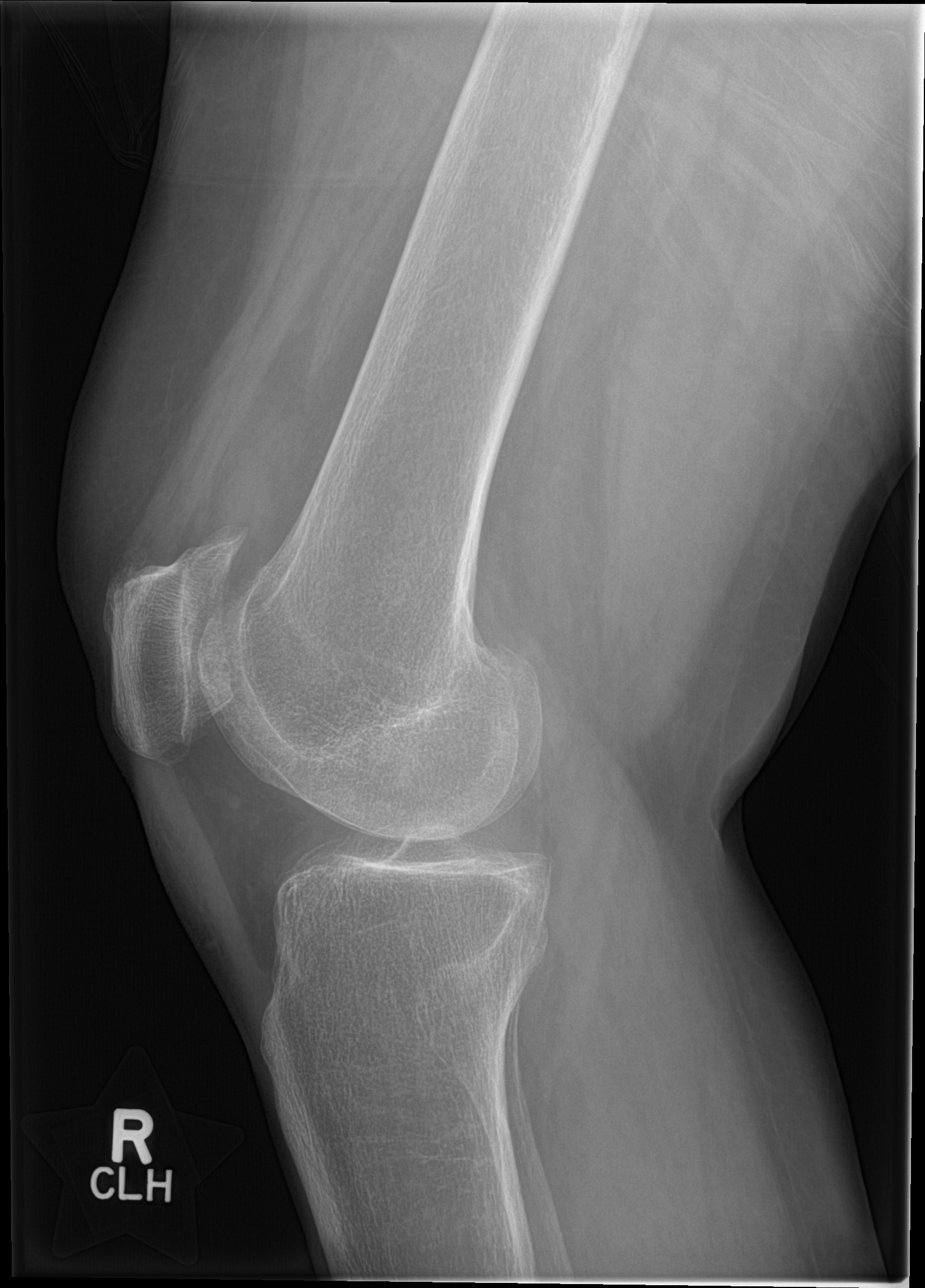

[4 of 4 positions shown; findings below may reference images not displayed]

FINDINGS: No fracture or dislocation. The alignment and joint spaces are
maintained. Mild spurring of the medial and lateral tibial femoral
and patellofemoral compartments. Small joint effusion. Soft tissues
are otherwise normal.
IMPRESSION: Small joint effusion and mild osteoarthritis.  No acute fracture.

## 2016-09-01 ENCOUNTER — Other Ambulatory Visit: Payer: Self-pay

## 2016-09-01 NOTE — Patient Outreach (Signed)
    Unsuccessful attempt made to contact patient for the HTA Screening Call. Will make another attempt to contact within 21 days

## 2016-09-04 ENCOUNTER — Other Ambulatory Visit (INDEPENDENT_AMBULATORY_CARE_PROVIDER_SITE_OTHER): Payer: PPO

## 2016-09-04 DIAGNOSIS — E0520 Thyrotoxicosis with toxic multinodular goiter without thyrotoxic crisis or storm: Secondary | ICD-10-CM

## 2016-09-04 LAB — T4, FREE: FREE T4: 3.23 ng/dL — AB (ref 0.60–1.60)

## 2016-09-07 ENCOUNTER — Encounter: Payer: Self-pay | Admitting: Endocrinology

## 2016-09-07 ENCOUNTER — Telehealth: Payer: Self-pay

## 2016-09-07 ENCOUNTER — Ambulatory Visit (INDEPENDENT_AMBULATORY_CARE_PROVIDER_SITE_OTHER): Payer: PPO | Admitting: Endocrinology

## 2016-09-07 VITALS — BP 142/74 | HR 88 | Ht 69.0 in | Wt 157.6 lb

## 2016-09-07 DIAGNOSIS — E0520 Thyrotoxicosis with toxic multinodular goiter without thyrotoxic crisis or storm: Secondary | ICD-10-CM

## 2016-09-07 MED ORDER — METOPROLOL SUCCINATE ER 25 MG PO TB24: 25.0000 mg | ORAL_TABLET | Freq: Every day | ORAL | 0 refills | Status: DC

## 2016-09-07 MED ORDER — METHIMAZOLE 10 MG PO TABS: ORAL_TABLET | ORAL | 1 refills | Status: DC

## 2016-09-07 NOTE — Telephone Encounter (Signed)
Patient was scheduled at [facility] for her radioactive iodine treatment on 07/06/16 but she canceled and rescheduled for 07/20/16 which she no showed for- I attempted to reach patient but had to leave a vm requesting her call back so that we can get her rescheduled- I spoke with Denny Peon at [facility] and they are willing to reschedule but due to the high cost of the treatment if she no-shows again they will not order the treatment until she comes in to the office for the appointment and the patient will have to wait the 2 hours it takes to get it to the facility

## 2016-09-07 NOTE — Telephone Encounter (Signed)
When she calls back please let her know that she cannot skip her appointment for the radioactive iodine treatment

## 2016-09-07 NOTE — Patient Instructions (Signed)
Take 5 pills daily

## 2016-09-07 NOTE — Progress Notes (Signed)
Patient ID: [name], female   DOB: 18-Jan-1946, 71 y.o.   MRN: [id number]                                                                                                                 Reason for Appointment:  Hyperthyroidism, follow-up    Chief complaint: Follow-up of thyroid   History of Present Illness:   History obtained on patient's initial visit: Patient had started losing weight last August and apparently had lost a total of about 30 pounds last year despite an increased appetite She did not discuss this with any physician Over the last couple of months also she has noticed that she is feeling excessively warm and sometimes sweaty.  Also has had tiredness. She does not think she has had significant symptoms of palpitations, shakiness, nervousness or change in bowel habits  In early February she was admitted to the hospital for shortness of breath and worsening of her asthma She had a significantly high pulse rate and was found to be hyperthyroid She was started on methimazole 20 mg twice a day on her discharge on 03/31/16  Recent history: Also she was supposed to have her I-131 treatment in 5/18 she has either canceled or no showed for her treatments  Currently taking 20 mg twice a day of methimazole, she has been irregular with this in the past Her baseline symptoms of feeling warm, fatigue and weight loss are somewhat better but she has lost 5 pounds since last month Also occasionally may feel palpitations She thinks she is taking her methimazole regularly However her free T4 level is only somewhat better than last month  Her thyroid scan shows hot and cold nodules and uptake of 54%    Wt Readings from Last 3 Encounters:  09/07/16 157 lb 9.6 oz (71.5 kg)  08/04/16 162 lb (73.5 kg)  08/02/16 163 lb 6.4 oz (74.1 kg)      Thyroid function tests as follows:     Lab Results  Component Value Date   FREET4 3.23 (H) 09/04/2016   FREET4 4.40 (H) 07/28/2016   FREET4  1.77 (H) 05/26/2016   FREET4 1.64 (H) 04/18/2016   T3FREE 10.9 (H) 07/28/2016   T3FREE 5.4 (H) 05/26/2016   T3FREE 3.7 04/18/2016   T3FREE 7.3 (H) 03/30/2016    Lab Results  Component Value Date   TSH <0.010 (L) 03/29/2016     No results found for: THYROTRECAB     Allergies as of 09/07/2016      Reactions   Aspirin Shortness Of Breath      Medication List       Accurate as of 09/07/16  2:35 PM. Always use your most recent med list.          acetaminophen 325 MG tablet Commonly known as:  TYLENOL Take 325-650 mg by mouth every 6 (six) hours as needed for mild pain or headache.   albuterol (2.5 MG/3ML) 0.083% nebulizer solution Commonly known as:  PROVENTIL Take 3 mLs (2.5 mg  total) by nebulization every 6 (six) hours as needed. asthma   albuterol 108 (90 Base) MCG/ACT inhaler Commonly known as:  PROVENTIL HFA;VENTOLIN HFA Inhale 2 puffs into the lungs every 6 (six) hours as needed for wheezing.   budesonide-formoterol 160-4.5 MCG/ACT inhaler Commonly known as:  SYMBICORT Inhale 2 puffs into the lungs 2 (two) times daily.   famotidine 20 MG tablet Commonly known as:  PEPCID One at bedtime   fluticasone 50 MCG/ACT nasal spray Commonly known as:  FLONASE Place 2 sprays into the nose daily.   methimazole 10 MG tablet Commonly known as:  TAPAZOLE 3 in am and 2 in pm   metoprolol succinate 25 MG 24 hr tablet Commonly known as:  TOPROL-XL Take 1 tablet (25 mg total) by mouth daily.   montelukast 10 MG tablet Commonly known as:  SINGULAIR Take 1 tablet (10 mg total) by mouth at bedtime.   pantoprazole 40 MG tablet Commonly known as:  PROTONIX Take 1 tablet (40 mg total) by mouth daily before breakfast.           Past Medical History:  Diagnosis Date  . Asthma   . GERD (gastroesophageal reflux disease)   . Heart murmur   . Pneumonia 03/28/2016    Past Surgical History:  Procedure Laterality Date  . CATARACT EXTRACTION W/ INTRAOCULAR LENS   IMPLANT, BILATERAL  2011 &2009  . TONSILLECTOMY    . TUBAL LIGATION      Family History  Problem Relation Age of Onset  . Asthma Sister   . Emphysema Father        was a smoker  . Hypertension Mother   . Hypertension Sister   . Thyroid disease Neg Hx   . Diabetes Neg Hx     Social History:  reports that she has never smoked. She has never used smokeless tobacco. She reports that she drinks alcohol. She reports that she does not use drugs.  Allergies:  Allergies  Allergen Reactions  . 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  Her thyroid levels are still high even with reportedly starting back on methimazole and taking 40 mg a She has difficulty compliant with instructions for treatment She has not gone back to do the I-131 treatment and did not show up for the previous appointment stating that she did not get insurance clearance yet She agrees to call to reschedule  She will get 30 mCi typical for toxic nodular goiter especially since she does have a large goiter overall  Meanwhile she will start taking 50 mg methimazole daily and also start 25 mg of metoprolol ER since she has mild tachycardia and will need to continue this while stopping methimazole for the treatment, again discussed how to do  this  We will have her scheduled for follow-up with labs in 6 weeks  Patient  Instructions  Take 5 pills daily     [name] 09/07/2016, 2:35 PM

## 2016-09-07 NOTE — Telephone Encounter (Signed)
Left vm requesting call back. 

## 2016-09-11 NOTE — Telephone Encounter (Signed)
Left v/m requesting she call back.

## 2016-09-12 ENCOUNTER — Telehealth: Payer: Self-pay

## 2016-09-12 NOTE — Telephone Encounter (Signed)
Left a vm informing the patient of [facility] phone number and requested she call and schedule the radio active iodine treatment- I will also sent a letter out with this information

## 2016-09-13 ENCOUNTER — Telehealth: Payer: Self-pay | Admitting: Endocrinology

## 2016-09-13 NOTE — Telephone Encounter (Signed)
[facility] calling to find out if her insurance has authorized her visit w/ them.  She said she will refer patient back to endo.  Thank you,  -LL

## 2016-09-13 NOTE — Telephone Encounter (Signed)
Please call [facility] if there are any further steps necessary for the patient to do prior to her scheduled appointment with their office. [facility] is scheduling the patient as soon as possible.

## 2016-09-15 ENCOUNTER — Telehealth: Payer: Self-pay | Admitting: Nurse Practitioner

## 2016-09-15 NOTE — Telephone Encounter (Signed)
error 

## 2016-09-15 NOTE — Telephone Encounter (Signed)
Pamala Hurry, from [facility], is not able to get in contact w/ the patient.   Please advise. She will not do anything further until she hears back.  -LL

## 2016-09-19 ENCOUNTER — Other Ambulatory Visit: Payer: Self-pay

## 2016-09-19 NOTE — Patient Outreach (Signed)
    Unsuccessful attempt made to contact patient via telephone for this HTA telephone Screen.  Plan: Send printed HTN literature describing programs offered by [facility] and contact information for use by patient if needed.

## 2016-10-16 ENCOUNTER — Other Ambulatory Visit (INDEPENDENT_AMBULATORY_CARE_PROVIDER_SITE_OTHER): Payer: PPO

## 2016-10-16 DIAGNOSIS — E0520 Thyrotoxicosis with toxic multinodular goiter without thyrotoxic crisis or storm: Secondary | ICD-10-CM

## 2016-10-16 LAB — T3, FREE: T3, Free: 3.9 pg/mL (ref 2.3–4.2)

## 2016-10-16 LAB — T4, FREE: Free T4: 1.18 ng/dL (ref 0.60–1.60)

## 2016-10-19 ENCOUNTER — Ambulatory Visit (INDEPENDENT_AMBULATORY_CARE_PROVIDER_SITE_OTHER): Payer: PPO | Admitting: Endocrinology

## 2016-10-19 ENCOUNTER — Encounter: Payer: Self-pay | Admitting: Endocrinology

## 2016-10-19 ENCOUNTER — Telehealth: Payer: Self-pay

## 2016-10-19 VITALS — BP 132/82 | HR 80 | Ht 69.0 in | Wt 159.0 lb

## 2016-10-19 DIAGNOSIS — E0590 Thyrotoxicosis, unspecified without thyrotoxic crisis or storm: Secondary | ICD-10-CM | POA: Diagnosis not present

## 2016-10-19 DIAGNOSIS — E0520 Thyrotoxicosis with toxic multinodular goiter without thyrotoxic crisis or storm: Secondary | ICD-10-CM

## 2016-10-19 NOTE — Patient Instructions (Signed)
Take 2 pills 2x daily of methimazole  Stop this 1 week before Rx and restart 3 days later

## 2016-10-19 NOTE — Telephone Encounter (Signed)
Called [facility] and Denny Peon stated that she was not able to talk to me to find out what the problem is with getting this patient scheduled because they are too busy today- Leah requested she be called back tomorrow afternoon to get this straightened out

## 2016-10-19 NOTE — Progress Notes (Signed)
Patient ID: [name], female   DOB: 12-May-1945, 71 y.o.   MRN: [id number]                                                                                                                 Reason for Appointment:  Hyperthyroidism, follow-up    Chief complaint: Follow-up of thyroid   History of Present Illness:   History obtained on patient's initial visit: Patient had started losing weight last August and apparently had lost a total of about 30 pounds last year despite an increased appetite She did not discuss this with any physician Over the last couple of months also she has noticed that she is feeling excessively warm and sometimes sweaty.  Also has had tiredness. She does not think she has had significant symptoms of palpitations, shakiness, nervousness or change in bowel habits  In early February she was admitted to the hospital for shortness of breath and worsening of her asthma She had a significantly high pulse rate and was found to be hyperthyroid She was started on methimazole 20 mg twice a day on her discharge on 03/31/16  Recent history: Also she was supposed to have her I-131 treatment in 5/18 she has either canceled or no showed for her treatments However the nuclear medicine Department states that they have difficulty contacting her She thinks that she was told that insurance is not covering her treatment and she has not pursued this  Currently taking 50 mg of methimazole, the dose was increased on her last visit because of persistently high thyroid levels Her baseline symptoms of feeling warm, fatigue and weight loss are significantly better She said that she is feeling back to normal with her energy level and weight has leveled off No palpitations now  She thinks she is taking her methimazole regularly Her free T4 level is now back to normal along with free T3  Her thyroid scan shows hot and cold nodules and uptake of 54%    Wt Readings from Last 3 Encounters:    10/19/16 159 lb (72.1 kg)  09/07/16 157 lb 9.6 oz (71.5 kg)  08/04/16 162 lb (73.5 kg)      Thyroid function tests as follows:     Lab Results  Component Value Date   FREET4 1.18 10/16/2016   FREET4 3.23 (H) 09/04/2016   FREET4 4.40 (H) 07/28/2016   FREET4 1.77 (H) 05/26/2016   T3FREE 3.9 10/16/2016   T3FREE 10.9 (H) 07/28/2016   T3FREE 5.4 (H) 05/26/2016   T3FREE 3.7 04/18/2016    Lab Results  Component Value Date   TSH <0.010 (L) 03/29/2016     No results found for: THYROTRECAB     Allergies as of 10/19/2016      Reactions   Aspirin Shortness Of Breath      Medication List       Accurate as of 10/19/16  9:09 PM. Always use your most recent med list.          acetaminophen 325 MG tablet  Commonly known as:  TYLENOL Take 325-650 mg by mouth every 6 (six) hours as needed for mild pain or headache.   albuterol (2.5 MG/3ML) 0.083% nebulizer solution Commonly known as:  PROVENTIL Take 3 mLs (2.5 mg total) by nebulization every 6 (six) hours as needed. asthma   albuterol 108 (90 Base) MCG/ACT inhaler Commonly known as:  PROVENTIL HFA;VENTOLIN HFA Inhale 2 puffs into the lungs every 6 (six) hours as needed for wheezing.   budesonide-formoterol 160-4.5 MCG/ACT inhaler Commonly known as:  SYMBICORT Inhale 2 puffs into the lungs 2 (two) times daily.   famotidine 20 MG tablet Commonly known as:  PEPCID One at bedtime   fluticasone 50 MCG/ACT nasal spray Commonly known as:  FLONASE Place 2 sprays into the nose daily.   methimazole 10 MG tablet Commonly known as:  TAPAZOLE 3 in am and 2 in pm   metoprolol succinate 25 MG 24 hr tablet Commonly known as:  TOPROL-XL Take 1 tablet (25 mg total) by mouth daily.   montelukast 10 MG tablet Commonly known as:  SINGULAIR Take 1 tablet (10 mg total) by mouth at bedtime.   pantoprazole 40 MG tablet Commonly known as:  PROTONIX Take 1 tablet (40 mg total) by mouth daily before breakfast.           Past  Medical History:  Diagnosis Date  . Asthma   . GERD (gastroesophageal reflux disease)   . Heart murmur   . Pneumonia 03/28/2016    Past Surgical History:  Procedure Laterality Date  . CATARACT EXTRACTION W/ INTRAOCULAR LENS  IMPLANT, BILATERAL  2011 &2009  . TONSILLECTOMY    . TUBAL LIGATION      Family History  Problem Relation Age of Onset  . Asthma Sister   . Emphysema Father        was a smoker  . Hypertension Mother   . Hypertension Sister   . Thyroid disease Neg Hx   . Diabetes Neg Hx     Social History:  reports that she has never smoked. She has never used smokeless tobacco. She reports that she drinks alcohol. She reports that she does not use drugs.  Allergies:  Allergies  Allergen Reactions  . Aspirin Shortness Of Breath      Review of Systems     Examination:   BP 132/82   Pulse 80   Ht 5\' 9"  (1.753 m)   Wt 159 lb (72.1 kg)   SpO2 97%   BMI 23.48 kg/m         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  Her thyroid levels are finally back to normal with taking 50 mg methimazole She may not have been taking these consistently previously Symptomatically she is doing better Her tachycardia is also controlled  Discussed that he cannot be on high doses of methimazole long-term and needs I-131 treatment for definitive management Although she claims that insurance does not cover her treatment she has not been either keeping her appointments with no clear medicine or have been difficult to reach  For now since her thyroid level has come down significantly  will reduce her dose back to  40 mg Continue 25 mg metoprolol  She knows to stop her methimazole 1 week before the treatment  Will try all possible means to try and get her scheduled for treatment  We will have her scheduled for follow-up with labs in 6 weeks  Total visit time 15 minutes  Patient Instructions  Take 2 pills 2x daily of methimazole  Stop this 1 week before Rx and restart 3 days later     [facility] 10/19/2016, 9:09 PM

## 2016-10-19 NOTE — Telephone Encounter (Signed)
Spoke to the patient and gave her the number to [facility] nuclear medicine which is [phone]- I let the patient know that I did try to reach them to discuss this but the office was too busy to talk to me about the issue so I advised the patient to call the and see what needs to be done so that she can get the radioactive iodine treatment

## 2016-10-19 NOTE — Telephone Encounter (Signed)
Please have patient call them directly, she needs to have their phone number.  Patient needs to let us know the outcome

## 2016-10-20 ENCOUNTER — Other Ambulatory Visit: Payer: Self-pay | Admitting: Nurse Practitioner

## 2016-10-20 DIAGNOSIS — E2839 Other primary ovarian failure: Secondary | ICD-10-CM

## 2016-11-08 ENCOUNTER — Encounter: Payer: Self-pay | Admitting: Nurse Practitioner

## 2016-11-08 ENCOUNTER — Ambulatory Visit (INDEPENDENT_AMBULATORY_CARE_PROVIDER_SITE_OTHER): Payer: PPO | Admitting: Nurse Practitioner

## 2016-11-08 ENCOUNTER — Telehealth: Payer: Self-pay | Admitting: Nurse Practitioner

## 2016-11-08 VITALS — BP 150/80 | HR 71 | Temp 97.9°F | Ht 69.0 in | Wt 161.0 lb

## 2016-11-08 DIAGNOSIS — R748 Abnormal levels of other serum enzymes: Secondary | ICD-10-CM | POA: Diagnosis not present

## 2016-11-08 DIAGNOSIS — R030 Elevated blood-pressure reading, without diagnosis of hypertension: Secondary | ICD-10-CM

## 2016-11-08 DIAGNOSIS — M25532 Pain in left wrist: Secondary | ICD-10-CM

## 2016-11-08 MED ORDER — DICLOFENAC SODIUM 2 % TD SOLN: 1.0000 [in_us] | Freq: Two times a day (BID) | TRANSDERMAL | 0 refills | Status: DC

## 2016-11-08 NOTE — Telephone Encounter (Signed)
-----   Message from Flossie Buffy, NP sent at 11/08/2016 12:11 PM EDT ----- Please call patient and remind about the need for mammogram and dexa scan. She needs to schedule dexa scan in basement. She also needs to call GI to schedule mammogram.  She needs to return to lab for repeat ALP level.   thank you

## 2016-11-08 NOTE — Progress Notes (Signed)
Subjective:  Patient ID: [name], female    DOB: 01-Jul-1945  Age: 71 y.o. MRN: [id number]  CC: Follow-up (3 mo fu) and Wrist Pain (left wrist pain--going on for a mo)   Wrist Pain   The pain is present in the left wrist. This is a new problem. The current episode started 1 to 4 weeks ago. The problem occurs intermittently. The problem has been waxing and waning. The quality of the pain is described as aching and dull. Associated symptoms include joint swelling. Pertinent negatives include no fever, inability to bear weight, itching, joint locking, limited range of motion, numbness, stiffness or tingling. The symptoms are aggravated by activity. She has tried rest for the symptoms. The treatment provided significant relief. Family history does not include gout or rheumatoid arthritis. Her past medical history is significant for osteoarthritis. There is no history of diabetes, gout or rheumatoid arthritis.   Weight loss: Her weight has stabilized. She reports improved energy and appetite. Improved palpitation, and SOB. Denies any more diarrhea with food intake. She has not had iodine treatment has recommended by Dr, [name].  Elevated BP: Reports home readings of 130s/80s. No CP, no Headache, no edema, no dizziness.  Elevated ALP: ABD [place] not done as ordered. Denies any ABD pain or nausea or vomiting.  Outpatient Medications Prior to Visit  Medication Sig Dispense Refill  . acetaminophen (TYLENOL) 325 MG tablet Take 325-650 mg by mouth every 6 (six) hours as needed for mild pain or headache.    . albuterol (PROVENTIL HFA;VENTOLIN HFA) 108 (90 Base) MCG/ACT inhaler Inhale 2 puffs into the lungs every 6 (six) hours as needed for wheezing. 1 Inhaler 1  . albuterol (PROVENTIL) (2.5 MG/3ML) 0.083% nebulizer solution Take 3 mLs (2.5 mg total) by nebulization every 6 (six) hours as needed. asthma 75 mL 0  . budesonide-formoterol (SYMBICORT) 160-4.5 MCG/ACT inhaler Inhale 2 puffs into the lungs 2  (two) times daily. 1 Inhaler 12  . famotidine (PEPCID) 20 MG tablet One at bedtime (Patient taking differently: Take 20 mg by mouth at bedtime. One at bedtime)    . fluticasone (FLONASE) 50 MCG/ACT nasal spray Place 2 sprays into the nose daily. (Patient taking differently: Place 2 sprays into both nostrils daily as needed for allergies. ) 48 g 3  . methimazole (TAPAZOLE) 10 MG tablet 3 in am and 2 in pm 150 tablet 1  . metoprolol succinate (TOPROL-XL) 25 MG 24 hr tablet Take 1 tablet (25 mg total) by mouth daily. 30 tablet 0  . montelukast (SINGULAIR) 10 MG tablet Take 1 tablet (10 mg total) by mouth at bedtime. 30 tablet 3  . pantoprazole (PROTONIX) 40 MG tablet Take 1 tablet (40 mg total) by mouth daily before breakfast. 90 tablet 4   No facility-administered medications prior to visit.     ROS See HPI  Objective:  BP (!) 150/80   Pulse 71   Temp 97.9 F (36.6 C)   Ht 5\' 9"  (1.753 m)   Wt 161 lb (73 kg)   SpO2 99%   BMI 23.78 kg/m   BP Readings from Last 3 Encounters:  11/08/16 (!) 150/80  10/19/16 132/82  09/07/16 (!) 142/74    Wt Readings from Last 3 Encounters:  11/08/16 161 lb (73 kg)  10/19/16 159 lb (72.1 kg)  09/07/16 157 lb 9.6 oz (71.5 kg)    Physical Exam  Constitutional: She is oriented to person, place, and time. No distress.  Neck: Thyromegaly present.  Cardiovascular:  Normal rate, regular rhythm and normal heart sounds.   Pulmonary/Chest: Effort normal.  Abdominal: Soft. Bowel sounds are normal.  Musculoskeletal: Normal range of motion. She exhibits tenderness. She exhibits no edema or deformity.       Left wrist: She exhibits tenderness. She exhibits normal range of motion, no bony tenderness, no swelling, no effusion, no crepitus and no deformity.  Tenderness with medial deviation of wrist. No palmar atrophy. Normal and equal strength. Right hand dominant.   Lymphadenopathy:    She has no cervical adenopathy.  Neurological: She is alert and  oriented to person, place, and time.  Skin: Skin is warm and dry.  Psychiatric: She has a normal mood and affect. Her behavior is normal.  Vitals reviewed.   Lab Results  Component Value Date   WBC 6.2 08/04/2016   HGB 9.9 (L) 08/04/2016   HCT 31.5 (L) 08/04/2016   PLT 218.0 08/04/2016   GLUCOSE 102 (H) 08/04/2016   CHOL 107 08/04/2016   TRIG 76.0 08/04/2016   HDL 45.00 08/04/2016   LDLCALC 47 08/04/2016   ALT 19 08/04/2016   AST 22 08/04/2016   NA 142 08/04/2016   K 3.8 08/04/2016   CL 108 08/04/2016   CREATININE 0.69 08/04/2016   BUN 17 08/04/2016   CO2 28 08/04/2016   TSH <0.010 (L) 03/29/2016   INR 0.99 03/31/2012   HGBA1C 6.2 08/04/2016    Nm Thyroid Sng Uptake W/imaging  Result Date: 06/09/2016 CLINICAL DATA:  Hyperthyroidism. TSH equal 0.1. Weight loss decreased appetite. Sweating and lethargy. EXAM: THYROID SCAN AND UPTAKE - 24 HOURS TECHNIQUE: Following the per oral administration of I-131 sodium iodide, the patient returned at 24 hours and uptake measurements were acquired with the uptake probe centered on the neck. Thyroid imaging was performed following the intravenous administration of the Tc-15m Pertechnetate. RADIOPHARMACEUTICALS:  7.5 MicroCuries I-131 sodium iodide orally and 9.8 mCi Technetium-14m pertechnetate IV COMPARISON:  None FINDINGS: Thyroid gland is enlarged. Therefore bilateral warm and cold thyroid nodules within the enlarged gland. Largest solid nodules in the inferior aspect of the RIGHT lobe of thyroid gland estimated at 2 cm. 24 hour I 131 uptake = 54% (normal 10-30%) IMPRESSION: 1. Findings consistent with toxic multinodular goiter. 2. Fall nodule within the inferior aspect the RIGHT lobe of thyroid gland. Consider thyroid ultrasound prior to I 131 therapy to evaluate ultrasound characteristics. Electronically Signed   By: [name] M.D.   On: 06/09/2016 07:45    Assessment & Plan:   [name] was seen today for follow-up and wrist  pain.  Diagnoses and all orders for this visit:  Alkaline phosphatase elevation -     US Abdomen Limited RUQ; Future -     Hepatic function panel; Future  Elevated BP without diagnosis of hypertension  Left wrist pain -     Diclofenac Sodium (PENNSAID) 2 % SOLN; Place 1 inch onto the skin 2 times daily at 12 noon and 4 pm.   I am having Ms. Gosling start on Diclofenac Sodium. I am also having her maintain her fluticasone, albuterol, albuterol, famotidine, pantoprazole, budesonide-formoterol, acetaminophen, montelukast, methimazole, and metoprolol succinate.  Meds ordered this encounter  Medications  . Diclofenac Sodium (PENNSAID) 2 % SOLN    Sig: Place 1 inch onto the skin 2 times daily at 12 noon and 4 pm.    Dispense:  2 g    Refill:  0    Order Specific Question:   Supervising Provider    Answer:  PLOTNIKOV, ALEKSEI V [[id number]]    Follow-up: Return in about 6 months (around 05/08/2017) for elevated BP and ALP.  Wilfred Lacy, NP

## 2016-11-08 NOTE — Patient Instructions (Addendum)
Check BP at home once a day and record. Bring BP machine to next office vist.  Wrist related to osteoarthritis of joint. Wear wrist brace day and night x 1week, then wear during day only. Use voltaren gel as directed  Please contact Dr. Ronnie Derby office about scheduling iodine treatment.  You need to schedule mammogram and dexa scan.  You will be contacted to schedule ABD [place]. Got o basement for repeat hepatic function panel.  Wrist Pain, Adult There are many things that can cause wrist pain. Some common causes include:  An injury to the wrist area.  Overuse of the joint.  A condition that causes too much pressure to be put on a nerve in the wrist (carpal tunnel syndrome).  Wear and tear of the joints that happens as a person gets older (osteoarthritis).  Other types of arthritis.  Sometimes, the cause of wrist pain is not known. Often, the pain goes away when you follow your doctor's instructions for helping pain at home, such as resting or icing your wrist. If your wrist pain does not go away, it is important to tell your doctor. Follow these instructions at home:  Rest the wrist area for 48 hours or more, or as long as told by your doctor.  If a splint or elastic bandage has been put on your wrist, use it as told by your doctor. ? Take off the splint or bandage only as told by your doctor. ? Loosen the splint or bandage if your fingers tingle, lose feeling (get numb), or turn cold or blue.  If directed, apply ice to the injured area: ? If you have a removable splint or elastic bandage, remove it as told by your doctor. ? Put ice in a plastic bag. ? Place a towel between your skin and the bag or between your splint or bandage and the bag. ? Leave the ice on for 20 minutes, 2-3 times a day.  Keep your arm raised (elevated) above the level of your heart while you are sitting or lying down.  Take over-the-counter and prescription medicines only as told by your doctor.  Keep  all follow-up visits as told by your doctor. This is important. Contact a doctor if:  You have a sudden sharp pain in the wrist, hand, or arm that is different or new.  The swelling or bruising on your wrist or hand gets worse.  Your skin becomes red, gets a rash, or has open sores.  Your pain does not get better or it gets worse. Get help right away if:  You lose feeling in your fingers or hand.  Your fingers turn white, very red, or cold and blue.  You cannot move your fingers.  You have a fever or chills. This information is not intended to replace advice given to you by your health care provider. Make sure you discuss any questions you have with your health care provider. Document Released: 07/26/2007 Document Revised: 09/02/2015 Document Reviewed: 08/26/2015 Elsevier Interactive Patient Education  2017 [employer].

## 2016-11-08 NOTE — Assessment & Plan Note (Signed)
ABD [place] ordered. Consider CT ABD if [place] inconclusive. Also need to repeat hepatic function.

## 2016-11-08 NOTE — Telephone Encounter (Signed)
Left vm for the pt to call back, need to inform pt massage below.

## 2016-11-10 ENCOUNTER — Telehealth: Payer: Self-pay | Admitting: Endocrinology

## 2016-11-10 NOTE — Telephone Encounter (Signed)
Patient called in reference to Dr. Loanne Drilling needing to call [name] long in reference to radioactive iodine treatment. Please call and advise.

## 2016-11-10 NOTE — Telephone Encounter (Signed)
Left another vm for pt to call back. Need to inform massage below.

## 2016-11-13 NOTE — Telephone Encounter (Signed)
[name] is supposed to call her

## 2016-11-13 NOTE — Telephone Encounter (Signed)
Routing to you °

## 2016-11-13 NOTE — Telephone Encounter (Signed)
Please advise 

## 2016-11-14 NOTE — Telephone Encounter (Signed)
Lattie Haw, please see the message from Dr. Dwyane Dee. Not sure if you called patient yet. Thanks!

## 2016-11-14 NOTE — Telephone Encounter (Signed)
This was the note from 10/19/16 where I spoke with the patient and explained what she needed to do per Dr. Jodelle Green re quest. I explained to the patient that since she had not shown up for her last 2 appointments with [facility] nuclear medicine she would have to call and set up an appointment and they will not order the treatment until she comes in for the visit due to the cost of the radio active iodine- patient verbalized an understanding of the-  10/19/16 in the office : Spoke to the patient and gave her the number to [facility] nuclear medicine which is [phone]- I let the patient know that I did try to reach them to discuss this but the office was too busy to talk to me about the issue so I advised the patient to call the and see what needs to be done so that she can get the radioactive iodine treatment

## 2016-11-14 NOTE — Telephone Encounter (Signed)
Left vm requesting the patient call [facility] to get the radio active iodine treatment scheduled- I requested a call back if she has any questions

## 2016-11-15 ENCOUNTER — Encounter: Payer: Self-pay | Admitting: Nurse Practitioner

## 2016-11-16 NOTE — Telephone Encounter (Signed)
Letter sent to pt's address.

## 2016-11-21 ENCOUNTER — Telehealth: Payer: Self-pay | Admitting: *Deleted

## 2016-11-21 NOTE — Telephone Encounter (Signed)
Please call the patient and tell her to get this scheduled as directed before

## 2016-11-21 NOTE — Telephone Encounter (Signed)
Left detailed message for patient.

## 2016-11-21 NOTE — Telephone Encounter (Signed)
Pamala Hurry from [place] [facility] called and states that Dr. Dwyane Dee put in a order for Thyroid Therapy for this patient back in July. Pamala Hurry has made several attempts to contact this patient to schedule . Patient has not contact her back . Pamala Hurry wanted to make Dr. Dwyane Dee aware. If he wants to speak to [place] at [facility] he can contact her at [phone]. Please advise. Thank you

## 2016-11-24 ENCOUNTER — Encounter: Payer: Self-pay | Admitting: Nurse Practitioner

## 2016-11-27 ENCOUNTER — Other Ambulatory Visit: Payer: PPO

## 2016-11-28 ENCOUNTER — Other Ambulatory Visit (INDEPENDENT_AMBULATORY_CARE_PROVIDER_SITE_OTHER): Payer: PPO

## 2016-11-28 DIAGNOSIS — E0590 Thyrotoxicosis, unspecified without thyrotoxic crisis or storm: Secondary | ICD-10-CM

## 2016-11-29 LAB — TSH

## 2016-11-29 LAB — T4, FREE: Free T4: 2.23 ng/dL — ABNORMAL HIGH (ref 0.60–1.60)

## 2016-11-29 NOTE — Progress Notes (Deleted)
Patient ID: [name], female   DOB: September 10, 1945, 71 y.o.   MRN: [id number]                                                                                                                 Reason for Appointment:  Hyperthyroidism, follow-up    Chief complaint: Follow-up of thyroid   History of Present Illness:   History obtained on patient's initial visit: Patient had started losing weight last August and apparently had lost a total of about 30 pounds last year despite an increased appetite She did not discuss this with any physician Over the last couple of months also she has noticed that she is feeling excessively warm and sometimes sweaty.  Also has had tiredness. She does not think she has had significant symptoms of palpitations, shakiness, nervousness or change in bowel habits  In early February she was admitted to the hospital for shortness of breath and worsening of her asthma She had a significantly high pulse rate and was found to be hyperthyroid She was started on methimazole 20 mg twice a day on her discharge on 03/31/16  Recent history: Also she was supposed to have her I-131 treatment in 5/18 she has either canceled or no showed for her treatments However the nuclear medicine Department states that they have difficulty contacting her She thinks that she was told that insurance is not covering her treatment and she has not pursued this  Currently taking 50 mg of methimazole, the dose was increased on her last visit because of persistently high thyroid levels Her baseline symptoms of feeling warm, fatigue and weight loss are significantly better She said that she is feeling back to normal with her energy level and weight has leveled off No palpitations now  She thinks she is taking her methimazole regularly Her free T4 level is now back to normal along with free T3  Her thyroid scan shows hot and cold nodules and uptake of 54%    Wt Readings from Last 3 Encounters:    11/08/16 161 lb (73 kg)  10/19/16 159 lb (72.1 kg)  09/07/16 157 lb 9.6 oz (71.5 kg)      Thyroid function tests as follows:     Lab Results  Component Value Date   FREET4 2.23 (H) 11/28/2016   FREET4 1.18 10/16/2016   FREET4 3.23 (H) 09/04/2016   FREET4 4.40 (H) 07/28/2016   T3FREE 3.9 10/16/2016   T3FREE 10.9 (H) 07/28/2016   T3FREE 5.4 (H) 05/26/2016   T3FREE 3.7 04/18/2016    Lab Results  Component Value Date   TSH <0.01 (L) 11/28/2016     No results found for: THYROTRECAB     Allergies as of 11/30/2016      Reactions   Aspirin Shortness Of Breath      Medication List       Accurate as of 11/29/16  9:18 PM. Always use your most recent med list.          acetaminophen 325 MG tablet  Commonly known as:  TYLENOL Take 325-650 mg by mouth every 6 (six) hours as needed for mild pain or headache.   albuterol (2.5 MG/3ML) 0.083% nebulizer solution Commonly known as:  PROVENTIL Take 3 mLs (2.5 mg total) by nebulization every 6 (six) hours as needed. asthma   albuterol 108 (90 Base) MCG/ACT inhaler Commonly known as:  PROVENTIL HFA;VENTOLIN HFA Inhale 2 puffs into the lungs every 6 (six) hours as needed for wheezing.   budesonide-formoterol 160-4.5 MCG/ACT inhaler Commonly known as:  SYMBICORT Inhale 2 puffs into the lungs 2 (two) times daily.   Diclofenac Sodium 2 % Soln Commonly known as:  PENNSAID Place 1 inch onto the skin 2 times daily at 12 noon and 4 pm.   famotidine 20 MG tablet Commonly known as:  PEPCID One at bedtime   fluticasone 50 MCG/ACT nasal spray Commonly known as:  FLONASE Place 2 sprays into the nose daily.   methimazole 10 MG tablet Commonly known as:  TAPAZOLE 3 in am and 2 in pm   metoprolol succinate 25 MG 24 hr tablet Commonly known as:  TOPROL-XL Take 1 tablet (25 mg total) by mouth daily.   montelukast 10 MG tablet Commonly known as:  SINGULAIR Take 1 tablet (10 mg total) by mouth at bedtime.   pantoprazole 40  MG tablet Commonly known as:  PROTONIX Take 1 tablet (40 mg total) by mouth daily before breakfast.           Past Medical History:  Diagnosis Date  . Asthma   . GERD (gastroesophageal reflux disease)   . Heart murmur   . Pneumonia 03/28/2016    Past Surgical History:  Procedure Laterality Date  . CATARACT EXTRACTION W/ INTRAOCULAR LENS  IMPLANT, BILATERAL  2011 &2009  . TONSILLECTOMY    . TUBAL LIGATION      Family History  Problem Relation Age of Onset  . Asthma Sister   . Emphysema Father        was a smoker  . Hypertension Mother   . Hypertension Sister   . Thyroid disease Neg Hx   . Diabetes Neg Hx     Social History:  reports that she has never smoked. She has never used smokeless tobacco. She reports that she drinks alcohol. She reports that she does not use drugs.  Allergies:  Allergies  Allergen Reactions  . Aspirin Shortness Of Breath      Review of Systems     Examination:   There were no vitals taken for this visit.        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  Her thyroid levels are finally back to normal with taking 50 mg methimazole She may not have been taking these consistently previously Symptomatically she is doing better Her tachycardia is also controlled  Discussed that he cannot be on high doses of methimazole long-term and needs I-131 treatment for definitive management Although she claims that insurance does not cover her treatment she has not been either keeping her appointments with no clear medicine or have been difficult to reach  For now since her  thyroid level has come down significantly will reduce her dose back to 40 mg Continue 25 mg metoprolol  She knows to stop her methimazole 1 week before the treatment  Will try all possible means to try and get her scheduled for treatment  We will have her scheduled for follow-up with labs in 6 weeks  Total visit time 15 minutes  There are no Patient Instructions on file for this visit.    Dwon Sky 11/29/2016, 9:18 PM

## 2016-11-30 ENCOUNTER — Ambulatory Visit: Payer: PPO | Admitting: Endocrinology

## 2016-12-01 ENCOUNTER — Other Ambulatory Visit: Payer: Self-pay | Admitting: Nurse Practitioner

## 2016-12-01 DIAGNOSIS — Z1231 Encounter for screening mammogram for malignant neoplasm of breast: Secondary | ICD-10-CM

## 2016-12-15 ENCOUNTER — Ambulatory Visit (HOSPITAL_COMMUNITY): Admission: RE | Admit: 2016-12-15 | Payer: PPO | Source: Ambulatory Visit

## 2016-12-18 ENCOUNTER — Encounter: Payer: Self-pay | Admitting: Gastroenterology

## 2016-12-19 ENCOUNTER — Ambulatory Visit (INDEPENDENT_AMBULATORY_CARE_PROVIDER_SITE_OTHER): Payer: PPO | Admitting: Nurse Practitioner

## 2016-12-19 ENCOUNTER — Encounter: Payer: Self-pay | Admitting: Nurse Practitioner

## 2016-12-19 ENCOUNTER — Ambulatory Visit: Payer: PPO | Admitting: Nurse Practitioner

## 2016-12-19 VITALS — BP 142/80 | HR 95 | Temp 97.7°F | Ht 69.0 in | Wt 152.0 lb

## 2016-12-19 DIAGNOSIS — J00 Acute nasopharyngitis [common cold]: Secondary | ICD-10-CM

## 2016-12-19 LAB — POCT INFLUENZA A/B
Influenza A, POC: NEGATIVE
Influenza B, POC: NEGATIVE

## 2016-12-19 MED ORDER — IPRATROPIUM BROMIDE 0.03 % NA SOLN: 2.0000 | Freq: Two times a day (BID) | NASAL | 0 refills | Status: DC

## 2016-12-19 MED ORDER — BENZONATATE 100 MG PO CAPS: 100.0000 mg | ORAL_CAPSULE | Freq: Three times a day (TID) | ORAL | 0 refills | Status: DC | PRN

## 2016-12-19 MED ORDER — DM-GUAIFENESIN ER 30-600 MG PO TB12: 1.0000 | ORAL_TABLET | Freq: Two times a day (BID) | ORAL | 0 refills | Status: DC | PRN

## 2016-12-19 MED ORDER — SALINE SPRAY 0.65 % NA SOLN: 1.0000 | NASAL | 0 refills | Status: DC | PRN

## 2016-12-19 NOTE — Progress Notes (Signed)
Subjective:  Patient ID: [name], female    DOB: 1945/11/17  Age: 71 y.o. MRN: [id number]  CC: Ear Fullness (left ear full-4 days) and Cough (sore throat,coughing yellow,chills--4 days)   URI   This is a new problem. The current episode started in the past 7 days. The problem has been gradually worsening. Associated symptoms include congestion, coughing, ear pain, headaches, a plugged ear sensation, rhinorrhea, sinus pain, sneezing and a sore throat. Pertinent negatives include no wheezing. She has tried nothing for the symptoms.    Outpatient Medications Prior to Visit  Medication Sig Dispense Refill  . acetaminophen (TYLENOL) 325 MG tablet Take 325-650 mg by mouth every 6 (six) hours as needed for mild pain or headache.    . albuterol (PROVENTIL HFA;VENTOLIN HFA) 108 (90 Base) MCG/ACT inhaler Inhale 2 puffs into the lungs every 6 (six) hours as needed for wheezing. 1 Inhaler 1  . albuterol (PROVENTIL) (2.5 MG/3ML) 0.083% nebulizer solution Take 3 mLs (2.5 mg total) by nebulization every 6 (six) hours as needed. asthma 75 mL 0  . budesonide-formoterol (SYMBICORT) 160-4.5 MCG/ACT inhaler Inhale 2 puffs into the lungs 2 (two) times daily. 1 Inhaler 12  . Diclofenac Sodium (PENNSAID) 2 % SOLN Place 1 inch onto the skin 2 times daily at 12 noon and 4 pm. 2 g 0  . famotidine (PEPCID) 20 MG tablet One at bedtime (Patient taking differently: Take 20 mg by mouth at bedtime. One at bedtime)    . fluticasone (FLONASE) 50 MCG/ACT nasal spray Place 2 sprays into the nose daily. (Patient taking differently: Place 2 sprays into both nostrils daily as needed for allergies. ) 48 g 3  . methimazole (TAPAZOLE) 10 MG tablet 3 in am and 2 in pm 150 tablet 1  . metoprolol succinate (TOPROL-XL) 25 MG 24 hr tablet Take 1 tablet (25 mg total) by mouth daily. 30 tablet 0  . montelukast (SINGULAIR) 10 MG tablet Take 1 tablet (10 mg total) by mouth at bedtime. 30 tablet 3  . pantoprazole (PROTONIX) 40 MG tablet  Take 1 tablet (40 mg total) by mouth daily before breakfast. 90 tablet 4   No facility-administered medications prior to visit.     ROS See HPI  Objective:  BP (!) 142/80   Pulse 95   Temp 97.7 F (36.5 C)   Ht 5\' 9"  (1.753 m)   Wt 152 lb (68.9 kg)   SpO2 99%   BMI 22.45 kg/m   BP Readings from Last 3 Encounters:  12/19/16 (!) 142/80  11/08/16 (!) 150/80  10/19/16 132/82    Wt Readings from Last 3 Encounters:  12/19/16 152 lb (68.9 kg)  11/08/16 161 lb (73 kg)  10/19/16 159 lb (72.1 kg)    Physical Exam  Constitutional: She is oriented to person, place, and time.  HENT:  Right Ear: Tympanic membrane, external ear and ear canal normal.  Left Ear: Tympanic membrane, external ear and ear canal normal.  Nose: Mucosal edema and rhinorrhea present. Right sinus exhibits maxillary sinus tenderness. Right sinus exhibits no frontal sinus tenderness. Left sinus exhibits maxillary sinus tenderness. Left sinus exhibits no frontal sinus tenderness.  Mouth/Throat: Uvula is midline. No trismus in the jaw. Posterior oropharyngeal erythema present. No oropharyngeal exudate.  Eyes: No scleral icterus.  Neck: Normal range of motion. Neck supple.  Cardiovascular: Normal rate and normal heart sounds.   Pulmonary/Chest: Effort normal and breath sounds normal.  Musculoskeletal: She exhibits no edema.  Lymphadenopathy:    She  has no cervical adenopathy.  Neurological: She is alert and oriented to person, place, and time.  Vitals reviewed.   Lab Results  Component Value Date   WBC 6.2 08/04/2016   HGB 9.9 (L) 08/04/2016   HCT 31.5 (L) 08/04/2016   PLT 218.0 08/04/2016   GLUCOSE 102 (H) 08/04/2016   CHOL 107 08/04/2016   TRIG 76.0 08/04/2016   HDL 45.00 08/04/2016   LDLCALC 47 08/04/2016   ALT 19 08/04/2016   AST 22 08/04/2016   NA 142 08/04/2016   K 3.8 08/04/2016   CL 108 08/04/2016   CREATININE 0.69 08/04/2016   BUN 17 08/04/2016   CO2 28 08/04/2016   TSH <0.01 (L)  11/28/2016   INR 0.99 03/31/2012   HGBA1C 6.2 08/04/2016    Nm Thyroid Sng Uptake W/imaging  Result Date: 06/09/2016 CLINICAL DATA:  Hyperthyroidism. TSH equal 0.1. Weight loss decreased appetite. Sweating and lethargy. EXAM: THYROID SCAN AND UPTAKE - 24 HOURS TECHNIQUE: Following the per oral administration of I-131 sodium iodide, the patient returned at 24 hours and uptake measurements were acquired with the uptake probe centered on the neck. Thyroid imaging was performed following the intravenous administration of the Tc-59m Pertechnetate. RADIOPHARMACEUTICALS:  7.5 MicroCuries I-131 sodium iodide orally and 9.8 mCi Technetium-65m pertechnetate IV COMPARISON:  None FINDINGS: Thyroid gland is enlarged. Therefore bilateral warm and cold thyroid nodules within the enlarged gland. Largest solid nodules in the inferior aspect of the RIGHT lobe of thyroid gland estimated at 2 cm. 24 hour I 131 uptake = 54% (normal 10-30%) IMPRESSION: 1. Findings consistent with toxic multinodular goiter. 2. Fall nodule within the inferior aspect the RIGHT lobe of thyroid gland. Consider thyroid ultrasound prior to I 131 therapy to evaluate ultrasound characteristics. Electronically Signed   By: Suzy Bouchard M.D.   On: 06/09/2016 07:45    Assessment & Plan:   [name] was seen today for ear fullness and cough.  Diagnoses and all orders for this visit:  Acute nasopharyngitis -     POCT Influenza A/B -     ipratropium (ATROVENT) 0.03 % nasal spray; Place 2 sprays into both nostrils 2 (two) times daily. Do not use for more than 5days. -     dextromethorphan-guaiFENesin (MUCINEX DM) 30-600 MG 12hr tablet; Take 1 tablet by mouth 2 (two) times daily as needed for cough. -     sodium chloride (OCEAN) 0.65 % SOLN nasal spray; Place 1 spray into both nostrils as needed for congestion. -     benzonatate (TESSALON) 100 MG capsule; Take 1 capsule (100 mg total) by mouth 3 (three) times daily as needed for cough.   I am  having [name] start on ipratropium, dextromethorphan-guaiFENesin, sodium chloride, and benzonatate. I am also having her maintain her fluticasone, albuterol, albuterol, famotidine, pantoprazole, budesonide-formoterol, acetaminophen, montelukast, methimazole, metoprolol succinate, and Diclofenac Sodium.  Meds ordered this encounter  Medications  . ipratropium (ATROVENT) 0.03 % nasal spray    Sig: Place 2 sprays into both nostrils 2 (two) times daily. Do not use for more than 5days.    Dispense:  30 mL    Refill:  0    Order Specific Question:   Supervising Provider    Answer:   Cassandria Anger [[id number]]  . dextromethorphan-guaiFENesin (MUCINEX DM) 30-600 MG 12hr tablet    Sig: Take 1 tablet by mouth 2 (two) times daily as needed for cough.    Dispense:  14 tablet    Refill:  0  Order Specific Question:   Supervising Provider    Answer:   Cassandria Anger [[id number]]  . sodium chloride (OCEAN) 0.65 % SOLN nasal spray    Sig: Place 1 spray into both nostrils as needed for congestion.    Dispense:  15 mL    Refill:  0    Order Specific Question:   Supervising Provider    Answer:   Cassandria Anger [[id number]]  . benzonatate (TESSALON) 100 MG capsule    Sig: Take 1 capsule (100 mg total) by mouth 3 (three) times daily as needed for cough.    Dispense:  20 capsule    Refill:  0    Order Specific Question:   Supervising Provider    Answer:   Cassandria Anger [[id number]]    Follow-up: No Follow-up on file.  Wilfred Lacy, NP

## 2016-12-19 NOTE — Patient Instructions (Signed)

## 2016-12-22 ENCOUNTER — Encounter (HOSPITAL_COMMUNITY): Payer: Self-pay

## 2016-12-22 ENCOUNTER — Observation Stay (HOSPITAL_COMMUNITY)
Admission: EM | Admit: 2016-12-22 | Discharge: 2016-12-23 | Disposition: A | Discharge status: Home / Self Care | Payer: PPO | Attending: Internal Medicine | Admitting: Internal Medicine

## 2016-12-22 ENCOUNTER — Emergency Department (HOSPITAL_COMMUNITY): Payer: PPO

## 2016-12-22 DIAGNOSIS — I10 Essential (primary) hypertension: Secondary | ICD-10-CM | POA: Diagnosis not present

## 2016-12-22 DIAGNOSIS — K219 Gastro-esophageal reflux disease without esophagitis: Secondary | ICD-10-CM | POA: Insufficient documentation

## 2016-12-22 DIAGNOSIS — S0990XA Unspecified injury of head, initial encounter: Secondary | ICD-10-CM | POA: Diagnosis not present

## 2016-12-22 DIAGNOSIS — E039 Hypothyroidism, unspecified: Secondary | ICD-10-CM | POA: Diagnosis not present

## 2016-12-22 DIAGNOSIS — E0590 Thyrotoxicosis, unspecified without thyrotoxic crisis or storm: Secondary | ICD-10-CM | POA: Diagnosis present

## 2016-12-22 DIAGNOSIS — D649 Anemia, unspecified: Secondary | ICD-10-CM | POA: Diagnosis not present

## 2016-12-22 DIAGNOSIS — J45909 Unspecified asthma, uncomplicated: Secondary | ICD-10-CM | POA: Diagnosis not present

## 2016-12-22 DIAGNOSIS — Z79899 Other long term (current) drug therapy: Secondary | ICD-10-CM | POA: Insufficient documentation

## 2016-12-22 DIAGNOSIS — E876 Hypokalemia: Secondary | ICD-10-CM | POA: Insufficient documentation

## 2016-12-22 DIAGNOSIS — R55 Syncope and collapse: Secondary | ICD-10-CM | POA: Diagnosis not present

## 2016-12-22 DIAGNOSIS — S0181XA Laceration without foreign body of other part of head, initial encounter: Secondary | ICD-10-CM | POA: Diagnosis not present

## 2016-12-22 DIAGNOSIS — E0520 Thyrotoxicosis with toxic multinodular goiter without thyrotoxic crisis or storm: Secondary | ICD-10-CM | POA: Insufficient documentation

## 2016-12-22 DIAGNOSIS — S098XXA Other specified injuries of head, initial encounter: Secondary | ICD-10-CM | POA: Diagnosis not present

## 2016-12-22 LAB — URINALYSIS, ROUTINE W REFLEX MICROSCOPIC
Bilirubin Urine: NEGATIVE
GLUCOSE, UA: NEGATIVE mg/dL
Hgb urine dipstick: NEGATIVE
KETONES UR: NEGATIVE mg/dL
LEUKOCYTES UA: NEGATIVE
Nitrite: NEGATIVE
PH: 6 (ref 5.0–8.0)
Protein, ur: NEGATIVE mg/dL
SPECIFIC GRAVITY, URINE: 1.004 — AB (ref 1.005–1.030)

## 2016-12-22 LAB — CBC
HCT: 31.3 % — ABNORMAL LOW (ref 36.0–46.0)
Hemoglobin: 9.6 g/dL — ABNORMAL LOW (ref 12.0–15.0)
MCH: 22.2 pg — AB (ref 26.0–34.0)
MCHC: 30.7 g/dL (ref 30.0–36.0)
MCV: 72.3 fL — ABNORMAL LOW (ref 78.0–100.0)
PLATELETS: 335 10*3/uL (ref 150–400)
RBC: 4.33 MIL/uL (ref 3.87–5.11)
RDW: 15.9 % — AB (ref 11.5–15.5)
WBC: 10.7 10*3/uL — ABNORMAL HIGH (ref 4.0–10.5)

## 2016-12-22 LAB — CBC WITH DIFFERENTIAL/PLATELET
BASOS PCT: 1 %
Basophils Absolute: 0.1 10*3/uL (ref 0.0–0.1)
EOS PCT: 2 %
Eosinophils Absolute: 0.2 10*3/uL (ref 0.0–0.7)
HEMATOCRIT: 33.4 % — AB (ref 36.0–46.0)
HEMOGLOBIN: 10 g/dL — AB (ref 12.0–15.0)
LYMPHS PCT: 32 %
Lymphs Abs: 2.8 10*3/uL (ref 0.7–4.0)
MCH: 21.6 pg — AB (ref 26.0–34.0)
MCHC: 29.9 g/dL — ABNORMAL LOW (ref 30.0–36.0)
MCV: 72.3 fL — AB (ref 78.0–100.0)
MONOS PCT: 9 %
Monocytes Absolute: 0.8 10*3/uL (ref 0.1–1.0)
NEUTROS PCT: 56 %
Neutro Abs: 4.7 10*3/uL (ref 1.7–7.7)
Platelets: 358 10*3/uL (ref 150–400)
RBC: 4.62 MIL/uL (ref 3.87–5.11)
RDW: 16 % — ABNORMAL HIGH (ref 11.5–15.5)
WBC: 8.6 10*3/uL (ref 4.0–10.5)

## 2016-12-22 LAB — COMPREHENSIVE METABOLIC PANEL
ALK PHOS: 151 U/L — AB (ref 38–126)
ALT: 17 U/L (ref 14–54)
AST: 22 U/L (ref 15–41)
Albumin: 3 g/dL — ABNORMAL LOW (ref 3.5–5.0)
Anion gap: 11 (ref 5–15)
BUN: 8 mg/dL (ref 6–20)
CALCIUM: 9.4 mg/dL (ref 8.9–10.3)
CHLORIDE: 103 mmol/L (ref 101–111)
CO2: 25 mmol/L (ref 22–32)
CREATININE: 0.7 mg/dL (ref 0.44–1.00)
Glucose, Bld: 114 mg/dL — ABNORMAL HIGH (ref 65–99)
Potassium: 2.9 mmol/L — ABNORMAL LOW (ref 3.5–5.1)
Sodium: 139 mmol/L (ref 135–145)
Total Bilirubin: 0.8 mg/dL (ref 0.3–1.2)
Total Protein: 7.3 g/dL (ref 6.5–8.1)

## 2016-12-22 LAB — TROPONIN I: Troponin I: <0.03 ng/mL (ref <0.03)

## 2016-12-22 LAB — TSH

## 2016-12-22 LAB — CREATININE, SERUM
CREATININE: 0.64 mg/dL (ref 0.44–1.00)
GFR calc Af Amer: >60 mL/min (ref >60)
GFR calc non Af Amer: >60 mL/min (ref >60)

## 2016-12-22 LAB — I-STAT TROPONIN, ED: TROPONIN I, POC: 0.02 ng/mL (ref 0.00–0.08)

## 2016-12-22 LAB — T4, FREE: FREE T4: 4.82 ng/dL — AB (ref 0.61–1.12)

## 2016-12-22 LAB — MAGNESIUM: MAGNESIUM: 1.9 mg/dL (ref 1.7–2.4)

## 2016-12-22 LAB — CBG MONITORING, ED: Glucose-Capillary: 97 mg/dL (ref 65–99)

## 2016-12-22 LAB — I-STAT CG4 LACTIC ACID, ED: Lactic Acid, Venous: 1.03 mmol/L (ref 0.5–1.9)

## 2016-12-22 MED ORDER — PANTOPRAZOLE SODIUM 40 MG PO TBEC
40.0000 mg | DELAYED_RELEASE_TABLET | Freq: Every day | ORAL | Status: DC
  Administered 2016-12-23: 40 mg via ORAL
  Filled 2016-12-22: qty 1

## 2016-12-22 MED ORDER — POTASSIUM CHLORIDE CRYS ER 20 MEQ PO TBCR
60.0000 meq | EXTENDED_RELEASE_TABLET | Freq: Once | ORAL | Status: AC
  Administered 2016-12-22: 60 meq via ORAL
  Filled 2016-12-22: qty 3

## 2016-12-22 MED ORDER — METOPROLOL SUCCINATE ER 25 MG PO TB24
25.0000 mg | ORAL_TABLET | Freq: Every day | ORAL | Status: DC
  Administered 2016-12-23: 25 mg via ORAL
  Filled 2016-12-22: qty 1

## 2016-12-22 MED ORDER — IPRATROPIUM BROMIDE 0.06 % NA SOLN
2.0000 | Freq: Two times a day (BID) | NASAL | Status: DC
  Administered 2016-12-23: 2 via NASAL
  Filled 2016-12-22 (×2): qty 15

## 2016-12-22 MED ORDER — MOMETASONE FURO-FORMOTEROL FUM 200-5 MCG/ACT IN AERO
2.0000 | INHALATION_SPRAY | Freq: Two times a day (BID) | RESPIRATORY_TRACT | Status: DC
  Administered 2016-12-23: 2 via RESPIRATORY_TRACT
  Filled 2016-12-22: qty 8.8

## 2016-12-22 MED ORDER — ACETAMINOPHEN 650 MG RE SUPP: 650.0000 mg | Freq: Four times a day (QID) | RECTAL | Status: DC | PRN

## 2016-12-22 MED ORDER — SODIUM CHLORIDE 0.9 % IV BOLUS (SEPSIS)
1000.0000 mL | Freq: Once | INTRAVENOUS | Status: AC
  Administered 2016-12-22: 1000 mL via INTRAVENOUS

## 2016-12-22 MED ORDER — IPRATROPIUM BROMIDE 0.03 % NA SOLN
2.0000 | Freq: Two times a day (BID) | NASAL | Status: DC
  Filled 2016-12-22: qty 30

## 2016-12-22 MED ORDER — ALBUTEROL SULFATE (2.5 MG/3ML) 0.083% IN NEBU: 2.5000 mg | INHALATION_SOLUTION | Freq: Four times a day (QID) | RESPIRATORY_TRACT | Status: DC | PRN

## 2016-12-22 MED ORDER — FLUTICASONE PROPIONATE 50 MCG/ACT NA SUSP
2.0000 | Freq: Every day | NASAL | Status: DC | PRN
  Filled 2016-12-22: qty 16

## 2016-12-22 MED ORDER — ENOXAPARIN SODIUM 40 MG/0.4ML ~~LOC~~ SOLN
40.0000 mg | SUBCUTANEOUS | Status: DC
  Administered 2016-12-22: 40 mg via SUBCUTANEOUS
  Filled 2016-12-22: qty 0.4

## 2016-12-22 MED ORDER — FAMOTIDINE 20 MG PO TABS
20.0000 mg | ORAL_TABLET | Freq: Every day | ORAL | Status: DC
  Administered 2016-12-22: 20 mg via ORAL
  Filled 2016-12-22: qty 1

## 2016-12-22 MED ORDER — ONDANSETRON HCL 4 MG/2ML IJ SOLN: 4.0000 mg | Freq: Four times a day (QID) | INTRAMUSCULAR | Status: DC | PRN

## 2016-12-22 MED ORDER — ONDANSETRON HCL 4 MG PO TABS: 4.0000 mg | ORAL_TABLET | Freq: Four times a day (QID) | ORAL | Status: DC | PRN

## 2016-12-22 MED ORDER — SODIUM CHLORIDE 0.9 % IV SOLN
INTRAVENOUS | Status: AC
  Administered 2016-12-22 – 2016-12-23 (×2): via INTRAVENOUS

## 2016-12-22 MED ORDER — ACETAMINOPHEN 325 MG PO TABS: 650.0000 mg | ORAL_TABLET | Freq: Four times a day (QID) | ORAL | Status: DC | PRN

## 2016-12-22 MED ORDER — MONTELUKAST SODIUM 10 MG PO TABS
10.0000 mg | ORAL_TABLET | Freq: Every day | ORAL | Status: DC
  Administered 2016-12-22: 10 mg via ORAL
  Filled 2016-12-22 (×2): qty 1

## 2016-12-22 NOTE — ED Triage Notes (Signed)
Pt arrives from [place] via EMS. PT was standing in line when she became dizzy, diaphoretic, was syncopal, and hit back of head. Acute onset. Denies blood thinners. Recently d/c hyperthyroid meds. Reports n/v/d, sinus congestion over past week.

## 2016-12-22 NOTE — ED Notes (Signed)
Patient transported to CT 

## 2016-12-22 NOTE — H&P (Addendum)
History and Physical    Kang Ishida VZD:[id number] DOB: 08-Jun-1945 DOA: 12/22/2016  PCP: Flossie Buffy, NP  Patient coming from: Home.  Chief Complaint: Loss of consciousness.  HPI: [name] is a 71 y.o. female with history of hypothyroidism asthma was brought to the ER after patient had a syncopal episode.  Patient states she was shopping in [place] when she suddenly lost consciousness.  Patient had felt her face getting flushed and diaphoretic which she thought was due to hypothyroidism.  Lately she lost consciousness.  Patient does not know exactly how many minutes she lost consciousness but she regained consciousness while in the chart.  Denies any chest pain palpitations shortness of breath nausea vomiting headache visual symptoms or any tongue bite or incontinence of urine.  Patient had not taken her methimazole last 1 week in anticipation of radioiodine ablation which had not taken place last week due to some insurance issues.  ED Course: In the ER patient was nonfocal CT head was unremarkable.  EKG was showing normal sinus rhythm.  QTc 394 ms.  Potassium was 2.9 and was given replacement.  TSH is very low free T4 free T3 is pending.  Patient admitted for further management of syncope.  Review of Systems: As per HPI, rest all negative.   Past Medical History:  Diagnosis Date  . Asthma   . GERD (gastroesophageal reflux disease)   . Heart murmur   . Pneumonia 03/28/2016    Past Surgical History:  Procedure Laterality Date  . CATARACT EXTRACTION W/ INTRAOCULAR LENS  IMPLANT, BILATERAL  2011 &2009  . TONSILLECTOMY    . TUBAL LIGATION       reports that she has never smoked. She has never used smokeless tobacco. She reports that she drinks alcohol. She reports that she does not use drugs.  Allergies  Allergen Reactions  . Aspirin Shortness Of Breath    Family History  Problem Relation Age of Onset  . Asthma Sister   . Emphysema Father        was a smoker    . Hypertension Mother   . Hypertension Sister   . Thyroid disease Neg Hx   . Diabetes Neg Hx     Prior to Admission medications   Medication Sig Start Date End Date Taking? Authorizing Provider  acetaminophen (TYLENOL) 325 MG tablet Take 325-650 mg by mouth every 6 (six) hours as needed for mild pain or headache.   Yes [provider]  albuterol (PROVENTIL HFA;VENTOLIN HFA) 108 (90 Base) MCG/ACT inhaler Inhale 2 puffs into the lungs every 6 (six) hours as needed for wheezing. 02/27/15  Yes Thurnell Lose, MD  albuterol (PROVENTIL) (2.5 MG/3ML) 0.083% nebulizer solution Take 3 mLs (2.5 mg total) by nebulization every 6 (six) hours as needed. asthma 04/05/12  Yes Bonnielee Haff, MD  budesonide-formoterol [facility]) 160-4.5 MCG/ACT inhaler Inhale 2 puffs into the lungs 2 (two) times daily. 12/16/15  Yes Tanda Rockers, MD  dextromethorphan-guaiFENesin [facility] DM) 30-600 MG 12hr tablet Take 1 tablet by mouth 2 (two) times daily as needed for cough. 12/19/16  Yes Nche, Charlene Brooke, NP  famotidine (PEPCID) 20 MG tablet One at bedtime Patient taking differently: Take 20 mg by mouth at bedtime. One at bedtime 11/26/15  Yes Tanda Rockers, MD  fluticasone [facility]) 50 MCG/ACT nasal spray Place 2 sprays into the nose daily. Patient taking differently: Place 2 sprays into both nostrils daily as needed for allergies.  11/28/11  Yes Tanda Rockers,  MD  ipratropium (ATROVENT) 0.03 % nasal spray Place 2 sprays into both nostrils 2 (two) times daily. Do not use for more than 5days. 12/19/16  Yes Nche, Charlene Brooke, NP  methimazole (TAPAZOLE) 10 MG tablet 3 in am and 2 in pm 09/07/16  Yes Elayne Snare, MD  metoprolol succinate (TOPROL-XL) 25 MG 24 hr tablet Take 1 tablet (25 mg total) by mouth daily. 09/07/16  Yes Elayne Snare, MD  montelukast (SINGULAIR) 10 MG tablet Take 1 tablet (10 mg total) by mouth at bedtime. 04/10/16  Yes Magdalen Spatz, NP  pantoprazole (PROTONIX) 40 MG tablet Take 1 tablet  (40 mg total) by mouth daily before breakfast. 12/16/15  Yes Tanda Rockers, MD  benzonatate (TESSALON) 100 MG capsule Take 1 capsule (100 mg total) by mouth 3 (three) times daily as needed for cough. Patient not taking: Reported on 12/22/2016 12/19/16   Nche, Charlene Brooke, NP  Diclofenac Sodium (PENNSAID) 2 % SOLN Place 1 inch onto the skin 2 times daily at 12 noon and 4 pm. Patient not taking: Reported on 12/22/2016 11/08/16   Nche, Charlene Brooke, NP  sodium chloride (OCEAN) 0.65 % SOLN nasal spray Place 1 spray into both nostrils as needed for congestion. Patient not taking: Reported on 12/22/2016 12/19/16   Flossie Buffy, NP    Physical Exam: Vitals:   12/22/16 1526 12/22/16 1530 12/22/16 1630 12/22/16 1730  BP:  138/73 139/61 132/66  Pulse:  95 (!) 101 (!) 107  Resp:      Temp:      TempSrc:      SpO2:  99% 100% 99%  Weight: 68.5 kg (151 lb)     Height: 5\' 9"  (1.753 m)         Constitutional: Moderately built and nourished. Vitals:   12/22/16 1526 12/22/16 1530 12/22/16 1630 12/22/16 1730  BP:  138/73 139/61 132/66  Pulse:  95 (!) 101 (!) 107  Resp:      Temp:      TempSrc:      SpO2:  99% 100% 99%  Weight: 68.5 kg (151 lb)     Height: 5\' 9"  (1.753 m)      Eyes: Anicteric no pallor. ENMT: No discharge from the ears eyes nose or mouth. Neck: No mass felt.  No neck rigidity.  No JVD appreciated. Respiratory: No rhonchi or crepitations. Cardiovascular: S1-S2 heard no murmurs appreciated. Abdomen: Soft nontender bowel sounds present. Musculoskeletal: No edema.  No joint effusion. Skin: No rash.  Skin appears warm. Neurologic: Alert awake oriented to time place and person.  Moves all extremities. Psychiatric: Appears normal.  Normal affect.   Labs on Admission: I have personally reviewed following labs and imaging studies  CBC:  Recent Labs Lab 12/22/16 1525  WBC 8.6  NEUTROABS 4.7  HGB 10.0*  HCT 33.4*  MCV 72.3*  PLT 546   Basic Metabolic  Panel:  Recent Labs Lab 12/22/16 1525 12/22/16 1739  NA 139  --   K 2.9*  --   CL 103  --   CO2 25  --   GLUCOSE 114*  --   BUN 8  --   CREATININE 0.70  --   CALCIUM 9.4  --   MG  --  1.9   GFR: Estimated Creatinine Clearance: 65.5 mL/min (by C-G formula based on SCr of 0.7 mg/dL). Liver Function Tests:  Recent Labs Lab 12/22/16 1525  AST 22  ALT 17  ALKPHOS 151*  BILITOT 0.8  PROT  7.3  ALBUMIN 3.0*   No results for input(s): LIPASE, AMYLASE in the last 168 hours. No results for input(s): AMMONIA in the last 168 hours. Coagulation Profile: No results for input(s): INR, PROTIME in the last 168 hours. Cardiac Enzymes: No results for input(s): CKTOTAL, CKMB, CKMBINDEX, TROPONINI in the last 168 hours. BNP (last 3 results) No results for input(s): PROBNP in the last 8760 hours. HbA1C: No results for input(s): HGBA1C in the last 72 hours. CBG:  Recent Labs Lab 12/22/16 1521  GLUCAP 97   Lipid Profile: No results for input(s): CHOL, HDL, LDLCALC, TRIG, CHOLHDL, LDLDIRECT in the last 72 hours. Thyroid Function Tests:  Recent Labs  12/22/16 1556  TSH <0.010*   Anemia Panel: No results for input(s): VITAMINB12, FOLATE, FERRITIN, TIBC, IRON, RETICCTPCT in the last 72 hours. Urine analysis:    Component Value Date/Time   COLORURINE YELLOW 12/22/2016 1637   APPEARANCEUR CLEAR 12/22/2016 1637   LABSPEC 1.004 (L) 12/22/2016 1637   PHURINE 6.0 12/22/2016 1637   GLUCOSEU NEGATIVE 12/22/2016 1637   HGBUR NEGATIVE 12/22/2016 1637   BILIRUBINUR NEGATIVE 12/22/2016 1637   KETONESUR NEGATIVE 12/22/2016 1637   PROTEINUR NEGATIVE 12/22/2016 1637   UROBILINOGEN 0.2 09/05/2010 1552   NITRITE NEGATIVE 12/22/2016 1637   LEUKOCYTESUR NEGATIVE 12/22/2016 1637   Sepsis Labs: @LABRCNTIP (procalcitonin:4,lacticidven:4) )No results found for this or any previous visit (from the past 240 hour(s)).   Radiological Exams on Admission: Ct Head Wo Contrast  Result Date:  12/22/2016 CLINICAL DATA:  Dizziness. Patient fell with trauma to back of head. Nausea and emesis since the event. EXAM: CT HEAD WITHOUT CONTRAST TECHNIQUE: Contiguous axial images were obtained from the base of the skull through the vertex without intravenous contrast. COMPARISON:  None. FINDINGS: Brain: Mild generalized atrophy is present. Ventricles are proportionate to the degree of atrophy. No acute infarct, hemorrhage, or mass lesion is present. No significant extra-axial fluid collection is present. No significant white matter disease is evident. Vascular: Atherosclerotic calcifications are present bilaterally. There is no hyperdense vessel. Skull: Calvarium is intact. No focal lytic or blastic lesions are present. Sinuses/Orbits: Extensive paranasal sinus disease present. There is wall thickening in the posterior maxillary sinuses bilaterally. Nodular densities are present in the nasal cavity, suggesting sinonasal polyps. Bilateral lens replacements are present. The globes and orbits are within normal limits. Scratched IMPRESSION: 1. Normal CT appearance the brain for age. 2. Diffuse chronic sinus disease with probable sinonasal polyposis. Electronically Signed   By: San Morelle M.D.   On: 12/22/2016 17:06    EKG: Independently reviewed.  Normal sinus rhythm.  Assessment/Plan Principal Problem:   Syncope Active Problems:   Asthma, chronic   Hyperthyroidism    1. Syncope -cause not clear.  Will monitor in telemetry for any arrhythmias.  Check 2D echo and check orthostatics in a.m. 2. Hyperthyroidism -patient has not been taking her methimazole last 1 week in anticipation of radioactive iodine ablation.  Has not undergone the procedure due to insurance issues.  TSH is very low free T4 and T3 is pending.  If it is elevated patient is willing to be back on methimazole.  Patient was on 40 mg daily. 3. Hypokalemia -cause not clear.  Replace and recheck.  Magnesium levels are  normal. 4. Normocytic normochromic anemia -appears to be chronic.  Follow CBC. 5. History of asthma presently not wheezing.  I have reviewed patient's old charts and labs.   DVT prophylaxis: Lovenox. Code Status: Full code. Family Communication: Discussed with patient. Disposition  Plan: Home. Consults called: None. Admission status: Observation.   Rise Patience MD [facility] Pager [phone].  If 7PM-7AM, please contact night-coverage [email or web address] Password [facility]  12/22/2016, 7:22 PM

## 2016-12-22 NOTE — ED Provider Notes (Signed)
[facility] EMERGENCY DEPARTMENT Provider Note   CSN: [id number] Arrival date & time: 12/22/16  1515     History   Chief Complaint Chief Complaint  Patient presents with  . Loss of Consciousness    HPI [name] is a 71 y.o. female with a PMHx of toxic nodular goiter/hyperthyroidism, heart murmur, GERD, asthma, anemia, HTN, and polycythemia, who presents to the ED via EMS for evaluation after a syncopal event. Patient states that she was standing in line at [facility] around 1 PM when suddenly she felt sweaty, thought that this was just related to her hyperthyroidism since this sometimes happens to her, and the next thing she knows she woke up on the ground with people around her looking down at her. She does not recall anything between the time that she got sweaty and the time that she woke up. She is unsure how long she was unconscious, but thinks that it was fairly brief. She does not recall whether she hit her head or not, but she was flat on the ground when she woke up. She denies feeling any prodromal symptoms such as lightheadedness or dizziness, states that she felt fine today. No treatments were given prior to arrival, no known aggravating factors. She states that she's had URI symptoms 1 week, including rhinorrhea, sore throat, ear congestion, and a fever of 100.5 this past Saturday and Sunday but none since then. She states that Monday and Tuesday she had some lightheadedness with the URI symptoms, however that had resolved on Tuesday and she didn't have any since then. Chart review reveals that she was seen by her PCP Dr. Lorayne Marek ([facility]) on 12/19/16 for these symptoms, was prescribed atrovent nasal spray, mucinex DM, and tessalon perles. States she has been using the atrovent spray but not the other meds because she hasn't had a cough. She states that she did have one episode of nausea and vomiting after eating a hamburger on Wednesday 12/20/16, nonbloody  and nonbilious, but states that since then she has had no further nausea or vomiting. She also mentions that she has chronic diarrhea due to her hyperthyroidism, which is unchanged from her baseline, last BM was yesterday one time which was nonbloody. No known recent sick contacts, she is a nonsmoker, she is not on any blood thinners, and has no family history of cardiac disease. Had an echo in 03/2016 which showed evidence of high output failure however EF preserved at 55-60%. Does not recall ever having a stress test, heart cath, or carotid dopplers, and these are not found in the chart review.   She was also seen by her endocrinologist Dr. Dwyane Dee on 10/19/16 who recommended lowering her methimazole dose to 36m daily which would be stopped 1wk before I-131 treatment. She was scheduled for the treatment on 12/15/16 however it had to be cancelled. She has not been on her methimazole since 12/08/16 because she didn't find out from Dr. KDwyane Deeif he wanted to start back on it since the I-131 treatment was cancelled.   She denies ongoing fevers, chills, ear pain or drainage, drooling, trismus, cough, HA, vision changes, neck/back pain, lightheadedness/dizziness or prodromal symptoms, palpitations, CP, SOB, LE swelling, recent travel/surgery/immobilization, estrogen use, personal/family hx of DVT/PE, abd pain, ongoing N/V, constipation, melena, hematochezia, hematuria, dysuria, urine frequency/urgency, malodorous urine, myalgias, arthralgias, numbness, tingling, focal weakness, seizure like activity, postictal state, bowel/bladder incontinence, tongue biting, or any other complaints at this time.    The history is provided by  the patient and medical records. No language interpreter was used.  Loss of Consciousness   This is a new problem. The current episode started 1 to 2 hours ago. The problem occurs rarely. The problem has been resolved. Length of episode of loss of consciousness: unknown time frame. The  problem is associated with normal activity. Associated symptoms include congestion, diaphoresis and fever (last week, none since). Pertinent negatives include abdominal pain, back pain, bladder incontinence, bowel incontinence, chest pain, confusion, dizziness, focal sensory loss, focal weakness, headaches, light-headedness, nausea, palpitations, seizures, visual change, vomiting (once 2 days ago, none since) and weakness. She has tried nothing for the symptoms. The treatment provided no relief.    Past Medical History:  Diagnosis Date  . Asthma   . GERD (gastroesophageal reflux disease)   . Heart murmur   . Pneumonia 03/28/2016    Patient Active Problem List   Diagnosis Date Noted  . Alkaline phosphatase elevation 11/08/2016  . Anemia 06/30/2016  . GERD (gastroesophageal reflux disease) 06/30/2016  . Elevated BP without diagnosis of hypertension 05/01/2016  . Solitary pulmonary nodule 04/10/2016  . Influenza with pneumonia 03/29/2016  . Nonspecific abnormal electrocardiogram (ECG) (EKG) 03/29/2016  . Polycythemia 02/23/2015  . Hyperglycemia 02/23/2015  . Asthma, chronic 05/04/2012  . Acute asthma exacerbation 03/31/2012  . Hypokalemia 03/31/2012    Past Surgical History:  Procedure Laterality Date  . CATARACT EXTRACTION W/ INTRAOCULAR LENS  IMPLANT, BILATERAL  2011 &2009  . TONSILLECTOMY    . TUBAL LIGATION      OB History    No data available       Home Medications    Prior to Admission medications   Medication Sig Start Date End Date Taking? Authorizing Provider  acetaminophen (TYLENOL) 325 MG tablet Take 325-650 mg by mouth every 6 (six) hours as needed for mild pain or headache.    [provider]  albuterol (PROVENTIL HFA;VENTOLIN HFA) 108 (90 Base) MCG/ACT inhaler Inhale 2 puffs into the lungs every 6 (six) hours as needed for wheezing. 02/27/15   Thurnell Lose, MD  albuterol (PROVENTIL) (2.5 MG/3ML) 0.083% nebulizer solution Take 3 mLs (2.5 mg total)  by nebulization every 6 (six) hours as needed. asthma 04/05/12   Bonnielee Haff, MD  benzonatate (TESSALON) 100 MG capsule Take 1 capsule (100 mg total) by mouth 3 (three) times daily as needed for cough. 12/19/16   Nche, Charlene Brooke, NP  budesonide-formoterol (SYMBICORT) 160-4.5 MCG/ACT inhaler Inhale 2 puffs into the lungs 2 (two) times daily. 12/16/15   Tanda Rockers, MD  dextromethorphan-guaiFENesin (MUCINEX DM) 30-600 MG 12hr tablet Take 1 tablet by mouth 2 (two) times daily as needed for cough. 12/19/16   Nche, Charlene Brooke, NP  Diclofenac Sodium (PENNSAID) 2 % SOLN Place 1 inch onto the skin 2 times daily at 12 noon and 4 pm. 11/08/16   Nche, Charlene Brooke, NP  famotidine (PEPCID) 20 MG tablet One at bedtime Patient taking differently: Take 20 mg by mouth at bedtime. One at bedtime 11/26/15   Tanda Rockers, MD  fluticasone [facility]) 50 MCG/ACT nasal spray Place 2 sprays into the nose daily. Patient taking differently: Place 2 sprays into both nostrils daily as needed for allergies.  11/28/11   Tanda Rockers, MD  ipratropium (ATROVENT) 0.03 % nasal spray Place 2 sprays into both nostrils 2 (two) times daily. Do not use for more than 5days. 12/19/16   Nche, Charlene Brooke, NP  methimazole (TAPAZOLE) 10 MG tablet 3 in am  and 2 in pm 09/07/16   Elayne Snare, MD  metoprolol succinate (TOPROL-XL) 25 MG 24 hr tablet Take 1 tablet (25 mg total) by mouth daily. 09/07/16   Elayne Snare, MD  montelukast (SINGULAIR) 10 MG tablet Take 1 tablet (10 mg total) by mouth at bedtime. 04/10/16   Magdalen Spatz, NP  pantoprazole (PROTONIX) 40 MG tablet Take 1 tablet (40 mg total) by mouth daily before breakfast. 12/16/15   Tanda Rockers, MD  sodium chloride (OCEAN) 0.65 % SOLN nasal spray Place 1 spray into both nostrils as needed for congestion. 12/19/16   Nche, Charlene Brooke, NP    Family History Family History  Problem Relation Age of Onset  . Asthma Sister   . Emphysema Father        was a smoker  .  Hypertension Mother   . Hypertension Sister   . Thyroid disease Neg Hx   . Diabetes Neg Hx     Social History Social History  Substance Use Topics  . Smoking status: Never Smoker  . Smokeless tobacco: Never Used  . Alcohol use Yes     Comment: maybe 1-2 times annually     Allergies   Aspirin   Review of Systems Review of Systems  Constitutional: Positive for diaphoresis and fever (last week, none since). Negative for chills.  HENT: Positive for congestion, rhinorrhea and sore throat. Negative for drooling, ear discharge, ear pain and trouble swallowing.        No tongue trauma  Eyes: Negative for visual disturbance.  Respiratory: Negative for cough and shortness of breath.   Cardiovascular: Positive for syncope. Negative for chest pain, palpitations and leg swelling.  Gastrointestinal: Positive for diarrhea (chronic and unchanged). Negative for abdominal pain, blood in stool, bowel incontinence, constipation, nausea and vomiting (once 2 days ago, none since).  Genitourinary: Negative for bladder incontinence, difficulty urinating (no incontinence), dysuria, frequency, hematuria and urgency.       No malodorous urine  Musculoskeletal: Negative for arthralgias, back pain, myalgias and neck pain.  Skin: Negative for color change and wound.  Allergic/Immunologic: Negative for immunocompromised state.  Neurological: Positive for syncope. Negative for dizziness, focal weakness, seizures, weakness, light-headedness, numbness and headaches.  Hematological: Does not bruise/bleed easily.  Psychiatric/Behavioral: Negative for confusion.   All other systems reviewed and are negative for acute change except as noted in the HPI.    Physical Exam Updated Vital Signs BP (!) 154/68 (BP Location: Right Arm)   Pulse 96   Temp (!) 97.5 F (36.4 C) (Oral)   Resp 18   SpO2 100%   Physical Exam  Constitutional: She is oriented to person, place, and time. Vital signs are normal. She appears  well-developed and well-nourished.  Non-toxic appearance. No distress.  Afebrile, nontoxic, NAD  HENT:  Head: Normocephalic and atraumatic. Head is without raccoon's eyes, without Battle's sign, without abrasion and without contusion.  Right Ear: Hearing, tympanic membrane, external ear and ear canal normal.  Left Ear: Hearing, tympanic membrane, external ear and ear canal normal.  Nose: Nose normal.  Mouth/Throat: Uvula is midline, oropharynx is clear and moist and mucous membranes are normal. No trismus in the jaw. No uvula swelling. Tonsils are 0 on the right. Tonsils are 0 on the left. No tonsillar exudate.  [place]/AT, no scalp tenderness or crepitus, no contusions or abrasions, no racoon eyes or battle's sign, no hemotympanum, no s/sx of basilar skull fx. Ears are clear bilaterally. Nose clear. Oropharynx clear and moist, without  uvular swelling or deviation, no trismus or drooling, no tonsillar swelling or erythema, no exudates.  No evidence of tongue trauma  Eyes: Pupils are equal, round, and reactive to light. Conjunctivae and EOM are normal. Right eye exhibits no discharge. Left eye exhibits no discharge.  PERRL, EOMI, no nystagmus, no visual field deficits, IOLs present in both eyes  Neck: Normal range of motion. Neck supple. No spinous process tenderness and no muscular tenderness present. No neck rigidity. Normal range of motion present.  FROM intact without spinous process TTP, no bony stepoffs or deformities, no paraspinous muscle TTP or muscle spasms. No rigidity or meningeal signs. No bruising or swelling.   Cardiovascular: Normal rate, regular rhythm and intact distal pulses.  Exam reveals no gallop and no friction rub.   Murmur heard.  Systolic murmur is present  Systolic murmur best heard at upper RSB. RRR, nl s1/s2, no rubs/gallops, distal pulses intact, no pedal edema   Pulmonary/Chest: Effort normal and breath sounds normal. No respiratory distress. She has no decreased breath  sounds. She has no wheezes. She has no rhonchi. She has no rales.  Abdominal: Soft. Normal appearance and bowel sounds are normal. She exhibits no distension. There is no tenderness. There is no rigidity, no rebound, no guarding, no CVA tenderness, no tenderness at McBurney's point and negative Murphy's sign.  Musculoskeletal: Normal range of motion.  MAE x4 Strength and sensation grossly intact in all extremities Distal pulses intact Gait steady No pedal edema, neg homan's bilaterally  Neurological: She is alert and oriented to person, place, and time. She has normal strength. No cranial nerve deficit or sensory deficit. Coordination and gait normal. GCS eye subscore is 4. GCS verbal subscore is 5. GCS motor subscore is 6.  CN 2-12 grossly intact A&O x4 GCS 15 Sensation and strength intact Gait nonataxic including with tandem walking Coordination with finger-to-nose WNL Neg pronator drift   Skin: Skin is warm, dry and intact. No rash noted.  Psychiatric: She has a normal mood and affect.  Nursing note and vitals reviewed.    Orthostatics: Orthostatic VS for the past 24 hrs:  BP- Lying Pulse- Lying BP- Sitting Pulse- Sitting BP- Standing at 0 minutes Pulse- Standing at 0 minutes  12/22/16 1633 146/65 99 157/65 104 159/73 109     ED Treatments / Results  Labs (all labs ordered are listed, but only abnormal results are displayed) Labs Reviewed  URINALYSIS, ROUTINE W REFLEX MICROSCOPIC - Abnormal; Notable for the following:       Result Value   Specific Gravity, Urine 1.004 (*)    All other components within normal limits  CBC WITH DIFFERENTIAL/PLATELET - Abnormal; Notable for the following:    Hemoglobin 10.0 (*)    HCT 33.4 (*)    MCV 72.3 (*)    MCH 21.6 (*)    MCHC 29.9 (*)    RDW 16.0 (*)    All other components within normal limits  COMPREHENSIVE METABOLIC PANEL - Abnormal; Notable for the following:    Potassium 2.9 (*)    Glucose, Bld 114 (*)    Albumin 3.0 (*)     Alkaline Phosphatase 151 (*)    All other components within normal limits  TSH - Abnormal; Notable for the following:    TSH <0.010 (*)    All other components within normal limits  MAGNESIUM  CBG MONITORING, ED  I-STAT TROPONIN, ED  I-STAT CG4 LACTIC ACID, ED    EKG  EKG Interpretation  Date/Time:  Friday December 22 2016 15:23:09 EDT Ventricular Rate:  95 PR Interval:    QRS Duration: 86 QT Interval:  313 QTC Calculation: 394 R Axis:   66 Text Interpretation:  Sinus rhythm LAE, consider biatrial enlargement Left ventricular hypertrophy Confirmed by Milton Ferguson [phone]) on 12/22/2016 4:27:12 PM       Radiology Ct Head Wo Contrast  Result Date: 12/22/2016 CLINICAL DATA:  Dizziness. Patient fell with trauma to back of head. Nausea and emesis since the event. EXAM: CT HEAD WITHOUT CONTRAST TECHNIQUE: Contiguous axial images were obtained from the base of the skull through the vertex without intravenous contrast. COMPARISON:  None. FINDINGS: Brain: Mild generalized atrophy is present. Ventricles are proportionate to the degree of atrophy. No acute infarct, hemorrhage, or mass lesion is present. No significant extra-axial fluid collection is present. No significant white matter disease is evident. Vascular: Atherosclerotic calcifications are present bilaterally. There is no hyperdense vessel. Skull: Calvarium is intact. No focal lytic or blastic lesions are present. Sinuses/Orbits: Extensive paranasal sinus disease present. There is wall thickening in the posterior maxillary sinuses bilaterally. Nodular densities are present in the nasal cavity, suggesting sinonasal polyps. Bilateral lens replacements are present. The globes and orbits are within normal limits. Scratched IMPRESSION: 1. Normal CT appearance the brain for age. 2. Diffuse chronic sinus disease with probable sinonasal polyposis. Electronically Signed   By: San Morelle M.D.   On: 12/22/2016 17:06     Echo  03/30/16: Study Conclusions - Left ventricle: The cavity size was normal. Systolic function was   normal. The estimated ejection fraction was in the range of 55%   to 60%. Wall motion was normal; there were no regional wall   motion abnormalities. There was an increased relative   contribution of atrial contraction to ventricular filling. - Mitral valve: There was mild to moderate regurgitation directed   centrally. - Left atrium: The atrium was mildly dilated. - Right atrium: The atrium was mildly dilated. - High velocity flow is seen across all valves, consistent with   high cardiac output.  Impressions: - Findings suggest &quot;high output failure&quot; (consider chronic anemia,   thyrotoxicosis, AV shunt, etc.)    Procedures Procedures (including critical care time)  Medications Ordered in ED Medications  sodium chloride 0.9 % bolus 1,000 mL (not administered)  sodium chloride 0.9 % bolus 1,000 mL (0 mLs Intravenous Stopped 12/22/16 1733)  potassium chloride SA (K-DUR,KLOR-CON) CR tablet 60 mEq (60 mEq Oral Given 12/22/16 1734)     Initial Impression / Assessment and Plan / ED Course  I have reviewed the triage vital signs and the nursing notes.  Pertinent labs & imaging results that were available during my care of the patient were reviewed by me and considered in my medical decision making (see chart for details).     71 y.o. female here with syncopal episode that occurred shortly prior to arrival, recalls being diaphoretic but denies feeling dizzy/lightheaded prior to the event, and then woke up with people around her; unsure of timing of LOC; denies seizure like activity reported to her, or any tongue trauma/incontinence. Recent URI x1 wk, given atrovent nasal spray by PCP on 12/19/16. Recently came off thyroid meds 2wks ago in preparation of getting iodine tx last week, but then procedure was cancelled. On exam, no focal neuro deficits, ears clear, no hemotympanum,  throat clear, nose clear, no spinal tenderness, no tenderness to all extremities, no bruising/abrasions anywhere, no s/sx of basilar skull fx, heart murmur heard but this  is apparently chronic. No pedal edema, no tachycardia or hypoxia. EKG nonischemic and similar to prior EKGs. Just had echo in 03/2016 which showed findings suggestive of high output failure, however preserved EF (55-60%). Will get labs, U/A, TSH, trop, lactic, and CT head, then reassess. Will get orthostatics and give fluids as well. Will reassess shortly. May need admission for syncope work up. Discussed case with my attending Dr. Roderic Palau who agrees with plan.   5:38 PM CBC w/diff with chronic anemia near baseline. CMP with hypokalemia of 2.9, will add-on Mg level and give PO Kdur; also with elevated Alk Phos 151 which appears chronic. U/A WNL. TSH <0.01 which is the same as all prior values, c/w known hyperthyroidism. Orthostatics negative although she does become slightly tachycardic with it (up to 109bpm). CT head neg for acute findings. Lactic and trop pending. Will reassess shortly.   6:09 PM Mg level WNL. Lactic WNL. Trop neg. Pt mildly tachycardic on recheck, will give another fluid bolus. Given syncopal episode with no prodromal symptoms and no definite etiology, and given her age and risk factors as well as abnormal Echo recently, will proceed with admission for further monitoring and work up. Pt agreeable. Will consult for admission.   6:32 PM Dr. Hal Hope of [facility] returning page and will admit. Wants T4/T3 ordered, will add those on now. Holding orders to be placed by admitting team. Please see their notes for further documentation of care. I appreciate their help with this pleasant pt's care. Pt stable at time of admission.    Final Clinical Impressions(s) / ED Diagnoses   Final diagnoses:  Syncope and collapse  Hypokalemia  Chronic anemia  Hyperthyroidism    New Prescriptions New Prescriptions   No medications on  [address], [place], [place] 12/22/16 1833    Milton Ferguson, MD 12/23/16 1200

## 2016-12-23 ENCOUNTER — Observation Stay (HOSPITAL_COMMUNITY): Payer: PPO

## 2016-12-23 ENCOUNTER — Observation Stay (HOSPITAL_BASED_OUTPATIENT_CLINIC_OR_DEPARTMENT_OTHER): Payer: PPO

## 2016-12-23 DIAGNOSIS — I340 Nonrheumatic mitral (valve) insufficiency: Secondary | ICD-10-CM

## 2016-12-23 DIAGNOSIS — J4530 Mild persistent asthma, uncomplicated: Secondary | ICD-10-CM

## 2016-12-23 DIAGNOSIS — E876 Hypokalemia: Secondary | ICD-10-CM

## 2016-12-23 DIAGNOSIS — E0590 Thyrotoxicosis, unspecified without thyrotoxic crisis or storm: Secondary | ICD-10-CM

## 2016-12-23 DIAGNOSIS — D649 Anemia, unspecified: Secondary | ICD-10-CM | POA: Diagnosis not present

## 2016-12-23 DIAGNOSIS — R55 Syncope and collapse: Secondary | ICD-10-CM

## 2016-12-23 LAB — TROPONIN I
Troponin I: <0.03 ng/mL (ref <0.03)
Troponin I: <0.03 ng/mL (ref <0.03)

## 2016-12-23 LAB — T3 UPTAKE: T3 UPTAKE RATIO: 45 % — AB (ref 24–39)

## 2016-12-23 LAB — CBC
HCT: 32.1 % — ABNORMAL LOW (ref 36.0–46.0)
HEMOGLOBIN: 9.6 g/dL — AB (ref 12.0–15.0)
MCH: 21.8 pg — AB (ref 26.0–34.0)
MCHC: 29.9 g/dL — AB (ref 30.0–36.0)
MCV: 73 fL — ABNORMAL LOW (ref 78.0–100.0)
Platelets: 309 10*3/uL (ref 150–400)
RBC: 4.4 MIL/uL (ref 3.87–5.11)
RDW: 16.5 % — ABNORMAL HIGH (ref 11.5–15.5)
WBC: 8.1 10*3/uL (ref 4.0–10.5)

## 2016-12-23 LAB — BASIC METABOLIC PANEL
ANION GAP: 8 (ref 5–15)
BUN: 6 mg/dL (ref 6–20)
CHLORIDE: 109 mmol/L (ref 101–111)
CO2: 23 mmol/L (ref 22–32)
Calcium: 9.1 mg/dL (ref 8.9–10.3)
Creatinine, Ser: 0.67 mg/dL (ref 0.44–1.00)
GFR calc Af Amer: >60 mL/min (ref >60)
GFR calc non Af Amer: >60 mL/min (ref >60)
GLUCOSE: 158 mg/dL — AB (ref 65–99)
POTASSIUM: 3.4 mmol/L — AB (ref 3.5–5.1)
Sodium: 140 mmol/L (ref 135–145)

## 2016-12-23 LAB — ECHOCARDIOGRAM COMPLETE
Height: 69 in
WEIGHTICAEL: 2441.6 [oz_av]

## 2016-12-23 LAB — D-DIMER, QUANTITATIVE: D-Dimer, Quant: 2.02 ug/mL-FEU — ABNORMAL HIGH (ref 0.00–0.50)

## 2016-12-23 MED ORDER — IOPAMIDOL (ISOVUE-370) INJECTION 76%
INTRAVENOUS | Status: AC
  Administered 2016-12-23: 100 mL
  Filled 2016-12-23: qty 100

## 2016-12-23 NOTE — Progress Notes (Signed)
  Echocardiogram 2D Echocardiogram has been performed.  [name] 12/23/2016, 11:18 AM

## 2016-12-23 NOTE — Progress Notes (Signed)
Patient escorted via [place], and D/C home via private auto.  [name] 12/23/2016 7:40 PM

## 2016-12-23 NOTE — Discharge Instructions (Signed)
Please speak with your family doctor about resuming Tapazole.  Please take all your medications with you for your next visit with your Primary MD. Please request your Primary MD to go over all hospital test results at the follow up. Please ask your Primary MD to get all Hospital records sent to his/her office.  If you experience worsening of your admission symptoms, develop shortness of breath, chest pain, suicidal or homicidal thoughts or a life threatening emergency, you must seek medical attention immediately by calling 911 or calling your MD.  [name] must read the complete instructions/literature along with all the possible adverse reactions/side effects for all the medicines you take including new medications that have been prescribed to you. Take new medicines after you have completely understood and accpet all the possible adverse reactions/side effects.   Do not drive when taking pain medications or sedatives.    Do not take more than prescribed Pain, Sleep and Anxiety Medications  If you have smoked or chewed Tobacco in the last 2 yrs please stop. Stop any regular alcohol and or recreational drug use.  Wear Seat belts while driving.

## 2016-12-23 NOTE — Progress Notes (Signed)
[name] to be D/C'd Home per MD order.  Discussed with the patient and all questions fully answered.  VSS, Skin clean, dry and intact without evidence of skin break down, no evidence of skin tears noted. IV catheter discontinued intact. Site without signs and symptoms of complications. Dressing and pressure applied.  An After Visit Summary was printed and given to the patient. Patient received prescription.  D/c education completed with patient/family including follow up instructions, medication list, d/c activities limitations if indicated, with other d/c instructions as indicated by MD - patient able to verbalize understanding, all questions fully answered.   Patient instructed to return to ED, call 911, or call MD for any changes in condition.   Patient is waiting on ride to come pick her up. Will continue to assess.  Christoper Fabian Haly Feher 12/23/2016 7:03 PM

## 2016-12-23 NOTE — Discharge Summary (Signed)
Physician Discharge Summary  [name] WIO:[id number] DOB: 04/12/1945 DOA: 12/22/2016  PCP: Flossie Buffy, NP  Admit date: 12/22/2016 Discharge date: 12/23/2016  Admitted From: home Disposition:  home   Recommendations for Outpatient Follow-up:  1. Recommended to speak with PCP about resuming Tapazole 2. Recommend Holter monitor  Discharge Condition:  stable   CODE STATUS:  Full code   Consultations:  none    Discharge Diagnoses:  Principal Problem:   Syncope Active Problems:   Asthma, chronic   Hyperthyroidism   Mild Hypokalemia   Essential HTN   Subjective: No complaints today. Ambulating without any orthostatic symptoms.   Brief Summary: [name] is a 71 y.o. female with history of hypothyroidism, asthma was brought to the ER after patient had a syncopal episode.  Patient states she was shopping in [place] when she suddenly lost consciousness.  Patient had felt her face getting flushed and has been diaphoretic which she thought was due to her hyperthyroidism.    Patient does not know exactly how many minutes she lost consciousness for and when she regained consciousness, she was on the floor. She did not note any chest pain, palpitations, dyspnea, vomiting or neurological symptoms. No bowel or bladder incontinence and no seizure like activity..    Patient had not taken her methimazole for 1 week in anticipation of radioiodine ablation which had not taken place   due to some insurance issues.  Hospital Course:  Syncope - she does admit that she has had a cold  but feels like she is getting better- no trouble with balance - not orthostatic- may have received fluid prior to having orthostatics done - CT head unrevealing - 2 D ECHO -  shows mild AS and mild diastolic dysfunction - Troponin negative  Mild hypokalemia - replaced- Mg normal  HTN - cont home meds- BP stable  Asthma - stable  Chronic anemia - stable  Discharge Instructions  Discharge  Instructions    Diet - low sodium heart healthy    Complete by:  As directed    Increase activity slowly    Complete by:  As directed      Allergies as of 12/23/2016      Reactions   Aspirin Shortness Of Breath      Medication List    TAKE these medications   acetaminophen 325 MG tablet Commonly known as:  TYLENOL Take 325-650 mg by mouth every 6 (six) hours as needed for mild pain or headache.   albuterol (2.5 MG/3ML) 0.083% nebulizer solution Commonly known as:  PROVENTIL Take 3 mLs (2.5 mg total) by nebulization every 6 (six) hours as needed. asthma   albuterol 108 (90 Base) MCG/ACT inhaler Commonly known as:  PROVENTIL HFA;VENTOLIN HFA Inhale 2 puffs into the lungs every 6 (six) hours as needed for wheezing.   benzonatate 100 MG capsule Commonly known as:  TESSALON Take 1 capsule (100 mg total) by mouth 3 (three) times daily as needed for cough.   budesonide-formoterol 160-4.5 MCG/ACT inhaler Commonly known as:  SYMBICORT Inhale 2 puffs into the lungs 2 (two) times daily.   dextromethorphan-guaiFENesin 30-600 MG 12hr tablet Commonly known as:  MUCINEX DM Take 1 tablet by mouth 2 (two) times daily as needed for cough.   Diclofenac Sodium 2 % Soln Commonly known as:  PENNSAID Place 1 inch onto the skin 2 times daily at 12 noon and 4 pm.   famotidine 20 MG tablet Commonly known as:  PEPCID One at bedtime What changed:  how much to take  how to take this  when to take this  additional instructions   fluticasone 50 MCG/ACT nasal spray Commonly known as:  FLONASE Place 2 sprays into the nose daily. What changed:  how to take this  when to take this  reasons to take this   ipratropium 0.03 % nasal spray Commonly known as:  ATROVENT Place 2 sprays into both nostrils 2 (two) times daily. Do not use for more than 5days.   methimazole 10 MG tablet Commonly known as:  TAPAZOLE 3 in am and 2 in pm   metoprolol succinate 25 MG 24 hr tablet Commonly  known as:  TOPROL-XL Take 1 tablet (25 mg total) by mouth daily.   montelukast 10 MG tablet Commonly known as:  SINGULAIR Take 1 tablet (10 mg total) by mouth at bedtime.   pantoprazole 40 MG tablet Commonly known as:  PROTONIX Take 1 tablet (40 mg total) by mouth daily before breakfast.   sodium chloride 0.65 % Soln nasal spray Commonly known as:  OCEAN Place 1 spray into both nostrils as needed for congestion.       Allergies  Allergen Reactions  . Aspirin Shortness Of Breath     Procedures/Studies: Left ventricle: The cavity size was normal. Wall thickness was   normal. Systolic function was normal. The estimated ejection   fraction was in the range of 50% to 55%. Wall motion was normal;   there were no regional wall motion abnormalities. Doppler   parameters are consistent with abnormal left ventricular   relaxation (grade 1 diastolic dysfunction). - Aortic valve: There was trivial regurgitation. - Mitral valve: There was mild regurgitation. - Left atrium: The atrium was moderately dilated.  Impressions:  - Normal LV systolic function; mild LVH; mild diastolic   dysfunction; elevated LVOT velocity of 2.5 m/s suggests mild AS   but visually valve opens well; trace AI; mild MR; moderate LAE.  Ct Head Wo Contrast  Result Date: 12/22/2016 CLINICAL DATA:  Dizziness. Patient fell with trauma to back of head. Nausea and emesis since the event. EXAM: CT HEAD WITHOUT CONTRAST TECHNIQUE: Contiguous axial images were obtained from the base of the skull through the vertex without intravenous contrast. COMPARISON:  None. FINDINGS: Brain: Mild generalized atrophy is present. Ventricles are proportionate to the degree of atrophy. No acute infarct, hemorrhage, or mass lesion is present. No significant extra-axial fluid collection is present. No significant white matter disease is evident. Vascular: Atherosclerotic calcifications are present bilaterally. There is no hyperdense vessel.  Skull: Calvarium is intact. No focal lytic or blastic lesions are present. Sinuses/Orbits: Extensive paranasal sinus disease present. There is wall thickening in the posterior maxillary sinuses bilaterally. Nodular densities are present in the nasal cavity, suggesting sinonasal polyps. Bilateral lens replacements are present. The globes and orbits are within normal limits. Scratched IMPRESSION: 1. Normal CT appearance the brain for age. 2. Diffuse chronic sinus disease with probable sinonasal polyposis. Electronically Signed   By: San Morelle M.D.   On: 12/22/2016 17:06       Discharge Exam: Vitals:   12/23/16 0858 12/23/16 1419  BP: (!) 146/66 128/62  Pulse: 88 86  Resp:  16  Temp:  98.1 F (36.7 C)  SpO2:  99%   Vitals:   12/23/16 0553 12/23/16 0839 12/23/16 0858 12/23/16 1419  BP: (!) 145/61  (!) 146/66 128/62  Pulse: 81  88 86  Resp: 18   16  Temp: 98.2 F (36.8 C)  98.1 F (36.7 C)  TempSrc: Oral   Oral  SpO2: 100% 100%  99%  Weight:      Height:        General: Pt is alert, awake, not in acute distress Cardiovascular: RRR, S1/S2 +, no rubs, no gallops Respiratory: CTA bilaterally, no wheezing, no rhonchi Abdominal: Soft, NT, ND, bowel sounds + Extremities: no edema, no cyanosis    The results of significant diagnostics from this hospitalization (including imaging, microbiology, ancillary and laboratory) are listed below for reference.     Microbiology: No results found for this or any previous visit (from the past 240 hour(s)).   Labs: BNP (last 3 results) No results for input(s): BNP in the last 8760 hours. Basic Metabolic Panel:  Recent Labs Lab 12/22/16 1525 12/22/16 1739 12/22/16 2012 12/23/16 0917  NA 139  --   --  140  K 2.9*  --   --  3.4*  CL 103  --   --  109  CO2 25  --   --  23  GLUCOSE 114*  --   --  158*  BUN 8  --   --  6  CREATININE 0.70  --  0.64 0.67  CALCIUM 9.4  --   --  9.1  MG  --  1.9  --   --    Liver Function  Tests:  Recent Labs Lab 12/22/16 1525  AST 22  ALT 17  ALKPHOS 151*  BILITOT 0.8  PROT 7.3  ALBUMIN 3.0*   No results for input(s): LIPASE, AMYLASE in the last 168 hours. No results for input(s): AMMONIA in the last 168 hours. CBC:  Recent Labs Lab 12/22/16 1525 12/22/16 2012 12/23/16 0917  WBC 8.6 10.7* 8.1  NEUTROABS 4.7  --   --   HGB 10.0* 9.6* 9.6*  HCT 33.4* 31.3* 32.1*  MCV 72.3* 72.3* 73.0*  PLT 358 335 309   Cardiac Enzymes:  Recent Labs Lab 12/22/16 2012 12/23/16 0223 12/23/16 0917  TROPONINI <0.03 <0.03 <0.03   BNP: Invalid input(s): POCBNP CBG:  Recent Labs Lab 12/22/16 1521  GLUCAP 97   D-Dimer  Recent Labs  12/23/16 1532  DDIMER 2.02*   Hgb A1c No results for input(s): HGBA1C in the last 72 hours. Lipid Profile No results for input(s): CHOL, HDL, LDLCALC, TRIG, CHOLHDL, LDLDIRECT in the last 72 hours. Thyroid function studies  Recent Labs  12/22/16 1556  TSH <0.010*   Anemia work up No results for input(s): VITAMINB12, FOLATE, FERRITIN, TIBC, IRON, RETICCTPCT in the last 72 hours. Urinalysis    Component Value Date/Time   COLORURINE YELLOW 12/22/2016 1637   APPEARANCEUR CLEAR 12/22/2016 1637   LABSPEC 1.004 (L) 12/22/2016 1637   PHURINE 6.0 12/22/2016 1637   GLUCOSEU NEGATIVE 12/22/2016 1637   HGBUR NEGATIVE 12/22/2016 1637   BILIRUBINUR NEGATIVE 12/22/2016 1637   KETONESUR NEGATIVE 12/22/2016 1637   PROTEINUR NEGATIVE 12/22/2016 1637   UROBILINOGEN 0.2 09/05/2010 1552   NITRITE NEGATIVE 12/22/2016 1637   LEUKOCYTESUR NEGATIVE 12/22/2016 1637   Sepsis Labs Invalid input(s): PROCALCITONIN,  WBC,  LACTICIDVEN Microbiology No results found for this or any previous visit (from the past 240 hour(s)).   Time coordinating discharge: Over 30 minutes  SIGNED:   Debbe Odea, MD  [facility] 12/23/2016, 6:08 PM Pager   If 7PM-7AM, please contact night-coverage [email or web address] Password [email or web address]

## 2016-12-23 NOTE — Progress Notes (Signed)
Pt arrived to unit and ambulated to bed. Identified appropriately and assessed. Placed on telemetry. Pt resting, denies any pain. Will continue to monitor.

## 2016-12-23 NOTE — Progress Notes (Signed)
SATURATION QUALIFICATIONS: (This note is used to comply with regulatory documentation for home oxygen)  Patient Saturations on Room Air at Rest = 100%  Patient Saturations on Room Air while Ambulating = 100%  

## 2016-12-26 ENCOUNTER — Inpatient Hospital Stay: Admission: RE | Admit: 2016-12-26 | Payer: PPO | Source: Ambulatory Visit

## 2016-12-26 ENCOUNTER — Telehealth: Payer: Self-pay

## 2016-12-26 ENCOUNTER — Other Ambulatory Visit: Payer: Self-pay | Admitting: Endocrinology

## 2016-12-26 ENCOUNTER — Ambulatory Visit: Payer: PPO

## 2016-12-26 DIAGNOSIS — E0520 Thyrotoxicosis with toxic multinodular goiter without thyrotoxic crisis or storm: Secondary | ICD-10-CM

## 2016-12-26 NOTE — Telephone Encounter (Signed)
Called [facility] and scheduled radio active iodine treatment for this patient- appointment is 01/26/17 at 10am patient needs to be there to check in a 9:45- I called the patient and left her a vm making her aware of this appointment and requesting she call [facility] if there is a schedule conflict and she needs to reschedule

## 2016-12-27 ENCOUNTER — Telehealth: Payer: Self-pay | Admitting: Endocrinology

## 2016-12-27 NOTE — Telephone Encounter (Signed)
LM for pt to call back to schedule appt with [place]

## 2016-12-27 NOTE — Telephone Encounter (Signed)
-----   Message from Elayne Snare, MD sent at 12/26/2016  1:56 PM EST ----- Needs follow-up appointment with labs, no appointment made for her thyroid.  I also let her know that have ordered I-131 uptake which needs to be repeated since the previous one is over 10 months old

## 2017-01-04 ENCOUNTER — Telehealth: Payer: Self-pay | Admitting: Endocrinology

## 2017-01-04 NOTE — Telephone Encounter (Signed)
Please advise 

## 2017-01-04 NOTE — Telephone Encounter (Signed)
It has been over 6 months since radioactive iodine uptake was done and the radioactive iodine treatment dose cannot be calculated with an outdated test result

## 2017-01-04 NOTE — Telephone Encounter (Signed)
New Message  Kim from [facility] verbalized questioning procedure code CPT [phone] submitted on 11.8.18 for thyroid.   Kim verbalized they have already paid for the procedure and inquiring why a repeat/second time.  Please f/u with Maudie Mercury

## 2017-01-05 NOTE — Telephone Encounter (Signed)
Called Kim and gave her the message from Dr. Dwyane Dee and she was going to process and document.

## 2017-01-15 ENCOUNTER — Ambulatory Visit: Payer: PPO | Admitting: Internal Medicine

## 2017-01-15 ENCOUNTER — Encounter: Payer: Self-pay | Admitting: Internal Medicine

## 2017-01-15 VITALS — BP 132/88 | HR 99 | Ht 69.0 in | Wt 159.6 lb

## 2017-01-15 DIAGNOSIS — J4530 Mild persistent asthma, uncomplicated: Secondary | ICD-10-CM | POA: Diagnosis not present

## 2017-01-15 MED ORDER — MONTELUKAST SODIUM 10 MG PO TABS: 10.0000 mg | ORAL_TABLET | Freq: Every day | ORAL | 3 refills | Status: DC

## 2017-01-15 MED ORDER — BUDESONIDE-FORMOTEROL FUMARATE 160-4.5 MCG/ACT IN AERO: 2.0000 | INHALATION_SPRAY | Freq: Two times a day (BID) | RESPIRATORY_TRACT | 3 refills | Status: AC

## 2017-01-15 NOTE — Progress Notes (Signed)
Subjective:     Patient ID: [name], female   DOB: [birth year]    MRN: [id number]    Brief patient profile:  26 yobf remote minimal smoker quit completely in the 1970's with dtc asthma since 2007 eval for the first time by the pulmonary service during admit [facility] in July 2012 with prominent upper airway "wheezing" with essentially nl pft's 10/01/2012     History of Present Illness   Admit date: 03/31/2012  Discharge date: 04/05/2012  DISCHARGE DIAGNOSES:  Principal Problem:  Acute asthma exacerbation    05/20/2013 f/u ov/[name] re: chronic asthma  Chief Complaint  Patient presents with  . Asthma    Breathing is doing well. Reports slight wheeze at this time. Denies SOB, coughing, chest tightness.   no need for saba, Not limited by breathing from desired activities   rec Continue symbicort 160 Take 2 puffs first thing in am and then another 2 puffs about 12 hours later.  Only use your albuterol (ventolin) as a rescue medication  Admission date: 02/23/2015 Admitting Physician Waldemar Dickens, MD  Discharge Date: 02/27/2015   Admission Diagnosis Asthma exacerbation [J45.901]  Discharge Diagnosis Asthma exacerbation [J45.901]   Principal Problem:  Asthma exacerbation Active Problems:  Polycythemia  Hyperglycemia   03/29/2015 ext post hosp f/u ov/transition of care/[name] re: s/p hosp for asthma now maint on symb 160/ singulair /ppi Chief Complaint  Patient presents with  . HFU    Pt states her breathing is much improved, almost back to her normal baseline. She uses albuterol inhaler 1 x per wk on average and has not needed neb.    did not follow any of the action plans previously discussed/ was not on gerd rx with flare but was still apparently taking symbicort 160 2bid  rec Plan A = Symbicort 80 Take 2 puffs first thing in am and then another 2 puffs about 12 hours later.  Work on inhaler technique:  relax and gently blow all the way out then take a nice smooth deep  breath back in, triggering the inhaler at same time you start breathing in.  Hold for up to 5 seconds if you can. Blow out thru nose. Rinse and gargle with water when done Plan B = back -up Only use your albuterol (ventolin) as a rescue medication Plan C = Crisis Ok to use nebulizer if you must up to every 4 hours if needed  Pantoprazole (protonix) 40 mg   Take  30-60 min before first meal of the day and Pepcid (famotidine)  20 mg one @  bedtime until return to office - this is the best way to tell whether stomach acid is contributing to your problem.   GERD diet  Prednisone 10 mg take  4 each am x 2 days,   2 each am x 2 days,  1 each am x 2 days and stop      07/13/2016  f/u ov/[name] re:  Chronic asthma on symb 160 2bid and singulair and ppi/h2hs Chief Complaint  Patient presents with  . Follow-up    Breathing is doing well overall. No need to albuterol inhaler or neb.   Not limited by breathing from desired activities  Even doing steps ok  rec No change rx    01/15/2017  f/u ov/[name] re: chronic asthma/ maint symb 160 / singulair Chief Complaint  Patient presents with  . Follow-up    asthma f/u, had recent cold symtoms, but no asthma problems   sleeping  fine/ good ex tol / no need for saba even during cold like illness which resolved on it's own with abx or pred  Not limited by breathing from desired activities    No obvious day to day or daytime variability or assoc excess/ purulent sputum or mucus plugs or hemoptysis or cp or chest tightness, subjective wheeze or overt sinus or hb symptoms. No unusual exp hx or h/o childhood pna/ asthma or knowledge of premature birth.  Sleeping ok flat without nocturnal  or early am exacerbation  of respiratory  c/o's or need for noct saba. Also denies any obvious fluctuation of symptoms with weather or environmental changes or other aggravating or alleviating factors except as outlined above   Current Allergies, Complete Past Medical History,  Past Surgical History, Family History, and Social History were reviewed in [employer] record.  ROS  The following are not active complaints unless bolded Hoarseness, sore throat, dysphagia, dental problems, itching, sneezing,  nasal congestion or discharge of excess mucus or purulent secretions, ear ache,   fever, chills, sweats, unintended wt loss or wt gain, classically pleuritic or exertional cp,  orthopnea pnd or leg swelling, presyncope, palpitations, abdominal pain, anorexia, nausea, vomiting, diarrhea  or change in bowel habits or change in bladder habits, change in stools or change in urine, dysuria, hematuria,  rash, arthralgias, visual complaints, headache, numbness, weakness or ataxia or problems with walking or coordination,  change in mood/affect or memory.        Current Meds  Medication Sig  . acetaminophen (TYLENOL) 325 MG tablet Take 325-650 mg by mouth every 6 (six) hours as needed for mild pain or headache.  . albuterol (PROVENTIL HFA;VENTOLIN HFA) 108 (90 Base) MCG/ACT inhaler Inhale 2 puffs into the lungs every 6 (six) hours as needed for wheezing.  Marland Kitchen albuterol (PROVENTIL) (2.5 MG/3ML) 0.083% nebulizer solution Take 3 mLs (2.5 mg total) by nebulization every 6 (six) hours as needed. asthma  . benzonatate (TESSALON) 100 MG capsule Take 1 capsule (100 mg total) by mouth 3 (three) times daily as needed for cough.  . budesonide-formoterol (SYMBICORT) 160-4.5 MCG/ACT inhaler Inhale 2 puffs into the lungs 2 (two) times daily.  Marland Kitchen dextromethorphan-guaiFENesin (MUCINEX DM) 30-600 MG 12hr tablet Take 1 tablet by mouth 2 (two) times daily as needed for cough.  . Diclofenac Sodium (PENNSAID) 2 % SOLN Place 1 inch onto the skin 2 times daily at 12 noon and 4 pm.  . famotidine (PEPCID) 20 MG tablet One at bedtime (Patient taking differently: Take 20 mg by mouth at bedtime. One at bedtime)  . fluticasone (FLONASE) 50 MCG/ACT nasal spray Place 2 sprays into the nose  daily. (Patient taking differently: Place 2 sprays into both nostrils daily as needed for allergies. )  . ipratropium (ATROVENT) 0.03 % nasal spray Place 2 sprays into both nostrils 2 (two) times daily. Do not use for more than 5days.  . methimazole (TAPAZOLE) 10 MG tablet 3 in am and 2 in pm  . metoprolol succinate (TOPROL-XL) 25 MG 24 hr tablet Take 1 tablet (25 mg total) by mouth daily.  . montelukast (SINGULAIR) 10 MG tablet Take 1 tablet (10 mg total) by mouth at bedtime.  . pantoprazole (PROTONIX) 40 MG tablet Take 1 tablet (40 mg total) by mouth daily before breakfast.  . sodium chloride (OCEAN) 0.65 % SOLN nasal spray Place 1 spray into both nostrils as needed for congestion.  Marland Kitchen     Marland Kitchen  Objective:   Physical Exam  amb bf nad / vital signs reviewed -  - Note on arrival 02 sats  98% on RA      wt   192  10/06/10 > 11/08/2010  192 > 02/08/2011 201> 05/09/2011  196> 08/09/2011  198 >11/13/2011 191 > 05/01/2012 188 > 186 08/20/2012 > 10/01/2012  190 > 05/20/2013 204 > 03/29/2015  206 > 05/28/2015  203 > 11/26/2015 171 > 12/16/2015  168 > 03/17/2016 160 > 07/13/2016  166  > 01/15/2017   160    HEENT: nl dentition, turbinates bilaterally, and oropharynx. Nl external ear canals without cough reflex   NECK :  without JVD/Nodes/TM/ nl carotid upstrokes bilaterally   LUNGS: no acc muscle use,  Nl contour chest which is clear to A and P bilaterally without cough on insp or exp maneuvers   CV:  RRR  no s3 or murmur or increase in P2, and no edema   ABD:  soft and nontender with nl inspiratory excursion in the supine position. No bruits or organomegaly appreciated, bowel sounds nl  MS:  Nl gait/ ext warm without deformities, calf tenderness, cyanosis or clubbing No obvious joint restrictions   SKIN: warm and dry without lesions    NEURO:  alert, approp, nl sensorium with  no motor or cerebellar deficits apparent.        Assessment:

## 2017-01-15 NOTE — Assessment & Plan Note (Signed)
-  Sinus CT 09/12/10 Previous bilateral nasoantral windows. Mild mucosal inflammation,  particularly notable in the maxillary, ethmoid and sphenoid  sinuses. No advanced sinusitis or free fluid. - Barium swallow nl 09/12/10 - PFTs 08/09/2011 FEV1  1.24 (50%) ratio 68 and no change p B2, DLCO 67 improves to 133 c/w minimal airflow obstruction - PFT's 10/01/2012 FEV2  1.63 (72%) ratio 76 and no change p B2, DLCO 76 improvede to 105   - PFT's  05/28/2015  FEV1 1.60  (71% ) ratio 75  p 6 % improvement from saba p nothing x 12 h prior to study with DLCO  65 % corrects to 93 % for alv volume  07/13/2016  After extensive coaching HFA effectiveness =    90%     All goals of chronic asthma control met including optimal function and elimination of symptoms with minimal need for rescue therapy.  Contingencies discussed in full including contacting this office immediately if not controlling the symptoms using the rule of two's.     F/u q 6 months appropriate ? Consider step down at some point   Each maintenance medication was reviewed in detail including most importantly the difference between maintenance and as needed and under what circumstances the prns are to be used.  Please see AVS for specific  Instructions which are unique to this visit and I personally typed out  which were reviewed in detail in writing with the patient and a copy provided.

## 2017-01-15 NOTE — Patient Instructions (Signed)
No change in medications    Please schedule a follow up visit in 6  months but call sooner if needed  

## 2017-02-08 ENCOUNTER — Encounter (HOSPITAL_COMMUNITY)
Admission: RE | Admit: 2017-02-08 | Discharge: 2017-02-08 | Disposition: A | Discharge status: Home / Self Care | Payer: PPO | Source: Ambulatory Visit | Attending: Endocrinology | Admitting: Endocrinology

## 2017-02-08 DIAGNOSIS — E0520 Thyrotoxicosis with toxic multinodular goiter without thyrotoxic crisis or storm: Secondary | ICD-10-CM | POA: Insufficient documentation

## 2017-02-08 MED ORDER — SODIUM IODIDE I 131 CAPSULE
5.6000 | Freq: Once | INTRAVENOUS | Status: AC | PRN
  Administered 2017-02-08: 5.6 via ORAL

## 2017-02-09 ENCOUNTER — Encounter (HOSPITAL_COMMUNITY)
Admission: RE | Admit: 2017-02-09 | Discharge: 2017-02-09 | Disposition: A | Discharge status: Home / Self Care | Payer: PPO | Source: Ambulatory Visit | Attending: Endocrinology | Admitting: Endocrinology

## 2017-02-09 DIAGNOSIS — E0520 Thyrotoxicosis with toxic multinodular goiter without thyrotoxic crisis or storm: Secondary | ICD-10-CM

## 2017-02-09 DIAGNOSIS — R634 Abnormal weight loss: Secondary | ICD-10-CM | POA: Diagnosis not present

## 2017-02-21 ENCOUNTER — Telehealth: Payer: Self-pay | Admitting: Endocrinology

## 2017-02-21 NOTE — Telephone Encounter (Signed)
Radioactive iodine uptake test is high indicating hyperthyroidism. We will need to order radioactive iodine treatment but she has not been seen since August and needs updated lab work and office visit as soon as possible before this can be done

## 2017-02-22 NOTE — Telephone Encounter (Signed)
I contacted the patient and left a voicemail advising of the message below. Requested a call back from the patient to schedule a lab and office visit appointment.

## 2017-03-05 NOTE — Telephone Encounter (Signed)
LM for pt to call back to schedule °

## 2017-03-20 NOTE — Telephone Encounter (Signed)
LM for pt to call back to schedule °

## 2017-03-29 ENCOUNTER — Encounter: Payer: Self-pay | Admitting: Endocrinology

## 2017-03-29 NOTE — Telephone Encounter (Signed)
Mailed a letter

## 2017-05-08 ENCOUNTER — Ambulatory Visit: Payer: PPO | Admitting: Nurse Practitioner

## 2017-05-08 DIAGNOSIS — Z0289 Encounter for other administrative examinations: Secondary | ICD-10-CM

## 2017-05-22 ENCOUNTER — Encounter: Payer: Self-pay | Admitting: Endocrinology

## 2017-05-22 ENCOUNTER — Ambulatory Visit (INDEPENDENT_AMBULATORY_CARE_PROVIDER_SITE_OTHER): Payer: Medicare HMO | Admitting: Endocrinology

## 2017-05-22 ENCOUNTER — Telehealth: Payer: Self-pay

## 2017-05-22 ENCOUNTER — Other Ambulatory Visit: Payer: Self-pay | Admitting: Endocrinology

## 2017-05-22 VITALS — BP 146/84 | HR 95 | Ht 69.0 in | Wt 165.0 lb

## 2017-05-22 DIAGNOSIS — E0520 Thyrotoxicosis with toxic multinodular goiter without thyrotoxic crisis or storm: Secondary | ICD-10-CM | POA: Diagnosis not present

## 2017-05-22 LAB — T4, FREE: Free T4: 3.07 ng/dL — ABNORMAL HIGH (ref 0.60–1.60)

## 2017-05-22 LAB — T3, FREE: T3, Free: 8.9 pg/mL — ABNORMAL HIGH (ref 2.3–4.2)

## 2017-05-22 LAB — TSH: TSH: <0.01 u[IU]/mL — ABNORMAL LOW (ref 0.35–4.50)

## 2017-05-22 MED ORDER — METHIMAZOLE 10 MG PO TABS: ORAL_TABLET | ORAL | 1 refills | Status: DC

## 2017-05-22 NOTE — Telephone Encounter (Signed)
Scheduled appointment with the patient present to go to [facility] to have treatment on 06/15/17 at 10 am just an Delia

## 2017-05-22 NOTE — Telephone Encounter (Signed)
Please let her know that her thyroid levels are quite high and we will start her back on methimazole 60 mg daily, to be stopped on 06/08/17 and restarted on 06/19/17.  Please remind her that she needs to go to [facility] without fail for her radioactive iodine treatment

## 2017-05-22 NOTE — Progress Notes (Signed)
Patient ID: [name], female   DOB: 05-19-1945, 72 y.o.   MRN: [id number]                                                                                                                 Reason for Appointment:  Hyperthyroidism, follow-up    Chief complaint: Follow-up of thyroid   History of Present Illness:   History obtained on patient's initial visit: Patient had started losing weight last August and apparently had lost a total of about 30 pounds last year despite an increased appetite She did not discuss this with any physician Over the last couple of months also she has noticed that she is feeling excessively warm and sometimes sweaty.  Also has had tiredness. She does not think she has had significant symptoms of palpitations, shakiness, nervousness or change in bowel habits  In early February she was admitted to the hospital for shortness of breath and worsening of her asthma She had a significantly high pulse rate and was found to be hyperthyroid She was started on methimazole 20 mg twice a day on her discharge on 03/31/16  Recent history: Also she was supposed to have her I-131 treatment in 5/18 she has either canceled or no showed for her treatments Again she was referred for I-131 treatment after her visit 09/2016 and she did not go for this The nuclear medicine Department states that they have difficulty contacting her she will also not keep appointments She was also referred for treatment after she was hospitalized in November when her thyroid levels were markedly increased and she did not do so  Since her discharge in November she is taking 40mg  of methimazole She has however not come back for follow-up  Her baseline symptoms are those of feeling warm, fatigue and weight loss He says that for some time she has been feeling fairly good although her symptoms are usually not significant even thyroid levels are consistently high  Her weight has improved recently She is  not taking any metoprolol and did not feel any palpitations  She thinks she is taking her methimazole regularly until last Saturday when she ran out  No recent thyroid levels available  Her thyroid scan shows hot and cold nodules and uptake of 54% of the first measurement and follow-up measurement in 12/18 was 76.8%  Wt Readings from Last 3 Encounters:  05/22/17 165 lb (74.8 kg)  01/15/17 159 lb 9.6 oz (72.4 kg)  12/22/16 152 lb 9.6 oz (69.2 kg)       Thyroid function tests as follows:     Lab Results  Component Value Date   FREET4 4.82 (H) 12/22/2016   FREET4 2.23 (H) 11/28/2016   FREET4 1.18 10/16/2016   FREET4 3.23 (H) 09/04/2016   T3FREE 3.9 10/16/2016   T3FREE 10.9 (H) 07/28/2016   T3FREE 5.4 (H) 05/26/2016   T3FREE 3.7 04/18/2016    Lab Results  Component Value Date   TSH <0.010 (L) 12/22/2016     No results found for:  THYROTRECAB     Allergies as of 05/22/2017      Reactions   Aspirin Shortness Of Breath      Medication List        Accurate as of 05/22/17  1:09 PM. Always use your most recent med list.          acetaminophen 325 MG tablet Commonly known as:  TYLENOL Take 325-650 mg by mouth every 6 (six) hours as needed for mild pain or headache.   albuterol (2.5 MG/3ML) 0.083% nebulizer solution Commonly known as:  PROVENTIL Take 3 mLs (2.5 mg total) by nebulization every 6 (six) hours as needed. asthma   albuterol 108 (90 Base) MCG/ACT inhaler Commonly known as:  PROVENTIL HFA;VENTOLIN HFA Inhale 2 puffs into the lungs every 6 (six) hours as needed for wheezing.   benzonatate 100 MG capsule Commonly known as:  TESSALON Take 1 capsule (100 mg total) by mouth 3 (three) times daily as needed for cough.   budesonide-formoterol 160-4.5 MCG/ACT inhaler Commonly known as:  SYMBICORT Inhale 2 puffs into the lungs 2 (two) times daily.   famotidine 20 MG tablet Commonly known as:  PEPCID One at bedtime   fluticasone 50 MCG/ACT nasal  spray Commonly known as:  FLONASE Place 2 sprays into the nose daily.   methimazole 10 MG tablet Commonly known as:  TAPAZOLE 3 in am and 2 in pm   metoprolol succinate 25 MG 24 hr tablet Commonly known as:  TOPROL-XL Take 1 tablet (25 mg total) by mouth daily.   montelukast 10 MG tablet Commonly known as:  SINGULAIR Take 1 tablet (10 mg total) by mouth at bedtime.   pantoprazole 40 MG tablet Commonly known as:  PROTONIX Take 1 tablet (40 mg total) by mouth daily before breakfast.           Past Medical History:  Diagnosis Date  . Asthma   . GERD (gastroesophageal reflux disease)   . Heart murmur   . Pneumonia 03/28/2016    Past Surgical History:  Procedure Laterality Date  . CATARACT EXTRACTION W/ INTRAOCULAR LENS  IMPLANT, BILATERAL  2011 &2009  . TONSILLECTOMY    . TUBAL LIGATION      Family History  Problem Relation Age of Onset  . Asthma Sister   . Emphysema Father        was a smoker  . Hypertension Mother   . Hypertension Sister   . Thyroid disease Neg Hx   . Diabetes Neg Hx     Social History:  reports that she has never smoked. She has never used smokeless tobacco. She reports that she drinks alcohol. She reports that she does not use drugs.  Allergies:  Allergies  Allergen Reactions  . Aspirin Shortness Of Breath      Review of Systems     Examination:   BP (!) 146/84 (BP Location: Left Arm, Patient Position: Sitting, Cuff Size: Normal)   Pulse 95   Ht 5\' 9"  (1.753 m)   Wt 165 lb (74.8 kg)   SpO2 97%   BMI 24.37 kg/m         No lid lag or ptosis  The thyroid is bilaterally enlarged and nodular  Right lobe is enlarged about 3 times normal, nodular and extended laterally   The isthmus is enlarged with about a 2.5 cm nodule  Left lobe is enlarged with a 2.5 cm smooth nodule and overall about twice normal at least  Neurological: Deep tendon reflexes at biceps  are slightly brisk She has a tremor of her hands  Skin is not  unusually warm    Assessment/Plan:   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   She has been extremely noncompliant with recommendations for treatment and follow-up despite repeated explanation of need for definitive treatment  She has not been seen in follow-up since August last year Her last thyroid levels were significantly increased when she was hospitalized and her I-131 treatment did go up in December 2 77% Even though she was referred for I-131 treatment in December she has not been scheduled yet Apparently there is difficulty reaching the patient when appointments are to be made  Her exam indicates that she is still hypothyroid and still has a significant nodular goiter She will need I-131 treatment  Information on I-131 given to patient She will stop the methimazole 7 days prior to her scheduled treatment and resume 3 days later, this was written for the patient  We will have her scheduled for follow-up with labs in 6 weeks  Total visit time 15 minutes  There are no Patient Instructions on file for this visit.    Elayne Snare 05/22/2017, 1:09 PM

## 2017-05-22 NOTE — Telephone Encounter (Signed)
Noted  

## 2017-05-23 NOTE — Telephone Encounter (Signed)
Pt called back and verbalized understanding of instructions and stated that she would pick up meds from her pharmacy and that she would keep her appt for her radioactive iodine treatment

## 2017-05-23 NOTE — Telephone Encounter (Signed)
Left message to have pt return call to the office

## 2017-06-15 ENCOUNTER — Encounter (HOSPITAL_COMMUNITY)
Admission: RE | Admit: 2017-06-15 | Discharge: 2017-06-15 | Disposition: A | Discharge status: Home / Self Care | Payer: Medicare HMO | Source: Ambulatory Visit | Attending: Endocrinology | Admitting: Endocrinology

## 2017-06-15 DIAGNOSIS — E0520 Thyrotoxicosis with toxic multinodular goiter without thyrotoxic crisis or storm: Secondary | ICD-10-CM | POA: Diagnosis not present

## 2017-07-03 ENCOUNTER — Ambulatory Visit: Payer: 59 | Admitting: Endocrinology

## 2017-07-03 ENCOUNTER — Encounter: Payer: Self-pay | Admitting: Endocrinology

## 2017-07-03 VITALS — BP 140/78 | HR 92 | Ht 69.0 in | Wt 163.2 lb

## 2017-07-03 DIAGNOSIS — E0520 Thyrotoxicosis with toxic multinodular goiter without thyrotoxic crisis or storm: Secondary | ICD-10-CM

## 2017-07-03 LAB — T3, FREE: T3 FREE: 9.9 pg/mL — AB (ref 2.3–4.2)

## 2017-07-03 LAB — T4, FREE: Free T4: 3.11 ng/dL — ABNORMAL HIGH (ref 0.60–1.60)

## 2017-07-03 MED ORDER — PREDNISONE 10 MG PO TABS: ORAL_TABLET | ORAL | 0 refills | Status: DC

## 2017-07-03 NOTE — Progress Notes (Signed)
Patient ID: [name], female   DOB: 04/30/1945, 72 y.o.   MRN: [id number]                                                                                                                 Reason for Appointment:  Hyperthyroidism, follow-up    Chief complaint: Follow-up of thyroid   History of Present Illness:   History obtained on patient's initial visit: Patient had started losing weight last August and apparently had lost a total of about 30 pounds last year despite an increased appetite She did not discuss this with any physician Over the last couple of months also she has noticed that she is feeling excessively warm and sometimes sweaty.  Also has had tiredness. She does not think she has had significant symptoms of palpitations, shakiness, nervousness or change in bowel habits In early February she was admitted to the hospital for shortness of breath and worsening of her asthma She had a significantly high pulse rate and was found to be hyperthyroid She was started on methimazole 20 mg twice a day on her discharge on 03/31/16  Recent history: Also she was supposed to have her I-131 treatment in 5/18 she has either canceled or no showed for her treatments She has finally had her I-131 treatment 06/15/2017 with 28.9 mCi She did resume her methimazole 3 days after the treatment  She has had no palpitations, unusual shakiness or sweating significant weight change Since about last week she said that she is having some difficulty swallowing unless she is eating soft foods she had a little choking on eating bread yesterday  No recent thyroid levels available  Her thyroid scan shows hot and cold nodules and uptake of 54% of the first measurement and follow-up measurement in 12/18 was 76.8%  Wt Readings from Last 3 Encounters:  07/03/17 163 lb 3.2 oz (74 kg)  05/22/17 165 lb (74.8 kg)  01/15/17 159 lb 9.6 oz (72.4 kg)       Thyroid function tests as follows:     Lab Results    Component Value Date   FREET4 3.07 (H) 05/22/2017   FREET4 4.82 (H) 12/22/2016   FREET4 2.23 (H) 11/28/2016   FREET4 1.18 10/16/2016   T3FREE 8.9 (H) 05/22/2017   T3FREE 3.9 10/16/2016   T3FREE 10.9 (H) 07/28/2016   T3FREE 5.4 (H) 05/26/2016    Lab Results  Component Value Date   TSH <0.01 (L) 05/22/2017     No results found for: THYROTRECAB     Allergies as of 07/03/2017      Reactions   Aspirin Shortness Of Breath      Medication List        Accurate as of 07/03/17  8:49 AM. Always use your most recent med list.          acetaminophen 325 MG tablet Commonly known as:  TYLENOL Take 325-650 mg by mouth every 6 (six) hours as needed for mild pain or headache.   albuterol (2.5 MG/3ML) 0.083%  nebulizer solution Commonly known as:  PROVENTIL Take 3 mLs (2.5 mg total) by nebulization every 6 (six) hours as needed. asthma   albuterol 108 (90 Base) MCG/ACT inhaler Commonly known as:  PROVENTIL HFA;VENTOLIN HFA Inhale 2 puffs into the lungs every 6 (six) hours as needed for wheezing.   budesonide-formoterol 160-4.5 MCG/ACT inhaler Commonly known as:  SYMBICORT Inhale 2 puffs into the lungs 2 (two) times daily.   famotidine 20 MG tablet Commonly known as:  PEPCID One at bedtime   fluticasone 50 MCG/ACT nasal spray Commonly known as:  FLONASE Place 2 sprays into the nose daily.   methimazole 10 MG tablet Commonly known as:  TAPAZOLE 3 tablets twice daily   metoprolol succinate 25 MG 24 hr tablet Commonly known as:  TOPROL-XL Take 1 tablet (25 mg total) by mouth daily.   montelukast 10 MG tablet Commonly known as:  SINGULAIR Take 1 tablet (10 mg total) by mouth at bedtime.   pantoprazole 40 MG tablet Commonly known as:  PROTONIX Take 1 tablet (40 mg total) by mouth daily before breakfast.           Past Medical History:  Diagnosis Date  . Asthma   . GERD (gastroesophageal reflux disease)   . Heart murmur   . Pneumonia 03/28/2016    Past  Surgical History:  Procedure Laterality Date  . CATARACT EXTRACTION W/ INTRAOCULAR LENS  IMPLANT, BILATERAL  2011 &2009  . TONSILLECTOMY    . TUBAL LIGATION      Family History  Problem Relation Age of Onset  . Asthma Sister   . Emphysema Father        was a smoker  . Hypertension Mother   . Hypertension Sister   . Thyroid disease Neg Hx   . Diabetes Neg Hx     Social History:  reports that she has never smoked. She has never used smokeless tobacco. She reports that she drinks alcohol. She reports that she does not use drugs.  Allergies:  Allergies  Allergen Reactions  . Aspirin Shortness Of Breath      Review of Systems  No recent problems with her asthma or dyspnea She has previously had steroids for asthma without side effects   Examination:   BP 140/78 (BP Location: Left Arm, Patient Position: Sitting, Cuff Size: Normal)   Pulse 92   Ht 5\' 9"  (1.753 m)   Wt 163 lb 3.2 oz (74 kg)   SpO2 97%   BMI 24.10 kg/m          The thyroid is bilaterally enlarged and nodular  Right lobe is enlarged about 3-3.5 times normal, nodular and extended laterally This is very firm, nontender  The isthmus is enlarged with a 2.5 cm nodule  Left lobe is enlarged and nodular, overall about twice normal at least  Neurological: Deep tendon reflexes at biceps are brisk She has a tremor of her hands  Skin is not warm    Assessment/Plan:   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    Elayne Snare 07/03/2017, 8:49 AM

## 2017-07-17 ENCOUNTER — Ambulatory Visit: Payer: PPO | Admitting: Internal Medicine

## 2017-08-16 ENCOUNTER — Telehealth: Payer: Self-pay | Admitting: Endocrinology

## 2017-08-16 ENCOUNTER — Other Ambulatory Visit: Payer: Self-pay

## 2017-08-16 MED ORDER — METHIMAZOLE 10 MG PO TABS: ORAL_TABLET | ORAL | 1 refills | Status: DC

## 2017-08-16 NOTE — Telephone Encounter (Signed)
methimazole (TAPAZOLE) 10 MG tablet    Patient stated that she needs refills sent into pharmacy.        [facility] ([facility]), [place] [facility] [place]

## 2017-08-16 NOTE — Telephone Encounter (Signed)
Rx sent 

## 2017-08-22 ENCOUNTER — Other Ambulatory Visit (INDEPENDENT_AMBULATORY_CARE_PROVIDER_SITE_OTHER): Payer: Medicare HMO

## 2017-08-22 DIAGNOSIS — E0520 Thyrotoxicosis with toxic multinodular goiter without thyrotoxic crisis or storm: Secondary | ICD-10-CM | POA: Diagnosis not present

## 2017-08-22 LAB — TSH: TSH: <0.01 u[IU]/mL — ABNORMAL LOW (ref 0.35–4.50)

## 2017-08-22 LAB — T3, FREE: T3, Free: 5 pg/mL — ABNORMAL HIGH (ref 2.3–4.2)

## 2017-08-22 LAB — T4, FREE: FREE T4: 1.6 ng/dL (ref 0.60–1.60)

## 2017-08-24 ENCOUNTER — Ambulatory Visit: Payer: Medicare HMO | Admitting: Endocrinology

## 2017-08-24 ENCOUNTER — Encounter: Payer: Self-pay | Admitting: Endocrinology

## 2017-08-24 VITALS — BP 140/80 | HR 82 | Ht 69.0 in | Wt 168.8 lb

## 2017-08-24 DIAGNOSIS — E0520 Thyrotoxicosis with toxic multinodular goiter without thyrotoxic crisis or storm: Secondary | ICD-10-CM

## 2017-08-24 NOTE — Patient Instructions (Signed)
Take 2 pills 2x daily for 1 week, then 1 pill 2x for 1 week then none

## 2017-08-24 NOTE — Progress Notes (Signed)
Patient ID: [name], female   DOB: April 27, 1945, 72 y.o.   MRN: [id number]                                                                                                                 Reason for Appointment:  Hyperthyroidism, follow-up    Chief complaint: Follow-up of thyroid   History of Present Illness:   History obtained on patient's initial visit: Patient had started losing weight in August 2017 and apparently had lost a total of about 30 pounds last year despite an increased appetite She did not discuss this with any physician Over the last couple of months also she has noticed that she is feeling excessively warm and sometimes sweaty.  Also has had tiredness. She does not think she has had significant symptoms of palpitations, shakiness, nervousness or change in bowel habits In early February she was admitted to the hospital for shortness of breath and worsening of her asthma She had a significantly high pulse rate and was found to be hyperthyroid She was started on methimazole 20 mg twice a day on her discharge on 03/31/16  Recent history: Also she was supposed to have her I-131 treatment in 5/18 she has either canceled or no showed for her treatments She finally had her I-131 treatment 06/15/2017 with 28.9 mCi  She did resume her methimazole 3 days after the treatment, now taking 30 mg twice daily  She has had more energy and less sleepiness now, also no palpitations, shakiness or sweating Also has gained back 5 pounds now On her last visit in 5/19 she was having difficulty swallowing and rarely choking but this got better after course of prednisone for 10 days  Her thyroid scan shows hot and cold nodules and uptake of 54% of the first measurement and follow-up measurement in 12/18 was 76.8%  Wt Readings from Last 3 Encounters:  08/24/17 168 lb 12.8 oz (76.6 kg)  07/03/17 163 lb 3.2 oz (74 kg)  05/22/17 165 lb (74.8 kg)       Thyroid function tests as follows:      Lab Results  Component Value Date   FREET4 1.60 08/22/2017   FREET4 3.11 (H) 07/03/2017   FREET4 3.07 (H) 05/22/2017   FREET4 4.82 (H) 12/22/2016   T3FREE 5.0 (H) 08/22/2017   T3FREE 9.9 (H) 07/03/2017   T3FREE 8.9 (H) 05/22/2017   T3FREE 3.9 10/16/2016    Lab Results  Component Value Date   TSH <0.01 (L) 08/22/2017     No results found for: THYROTRECAB     Allergies as of 08/24/2017      Reactions   Aspirin Shortness Of Breath      Medication List        Accurate as of 08/24/17  8:58 AM. Always use your most recent med list.          acetaminophen 325 MG tablet Commonly known as:  TYLENOL Take 325-650 mg by mouth every 6 (six) hours as needed for mild pain or  headache.   albuterol (2.5 MG/3ML) 0.083% nebulizer solution Commonly known as:  PROVENTIL Take 3 mLs (2.5 mg total) by nebulization every 6 (six) hours as needed. asthma   albuterol 108 (90 Base) MCG/ACT inhaler Commonly known as:  PROVENTIL HFA;VENTOLIN HFA Inhale 2 puffs into the lungs every 6 (six) hours as needed for wheezing.   budesonide-formoterol 160-4.5 MCG/ACT inhaler Commonly known as:  SYMBICORT Inhale 2 puffs into the lungs 2 (two) times daily.   famotidine 20 MG tablet Commonly known as:  PEPCID One at bedtime   fluticasone 50 MCG/ACT nasal spray Commonly known as:  FLONASE Place 2 sprays into the nose daily.   methimazole 10 MG tablet Commonly known as:  TAPAZOLE 3 tablets twice daily   metoprolol succinate 25 MG 24 hr tablet Commonly known as:  TOPROL-XL Take 1 tablet (25 mg total) by mouth daily.   montelukast 10 MG tablet Commonly known as:  SINGULAIR Take 1 tablet (10 mg total) by mouth at bedtime.   pantoprazole 40 MG tablet Commonly known as:  PROTONIX Take 1 tablet (40 mg total) by mouth daily before breakfast.           Past Medical History:  Diagnosis Date  . Asthma   . GERD (gastroesophageal reflux disease)   . Heart murmur   . Pneumonia 03/28/2016     Past Surgical History:  Procedure Laterality Date  . CATARACT EXTRACTION W/ INTRAOCULAR LENS  IMPLANT, BILATERAL  2011 &2009  . TONSILLECTOMY    . TUBAL LIGATION      Family History  Problem Relation Age of Onset  . Asthma Sister   . Emphysema Father        was a smoker  . Hypertension Mother   . Hypertension Sister   . Thyroid disease Neg Hx   . Diabetes Neg Hx     Social History:  reports that she has never smoked. She has never used smokeless tobacco. She reports that she drinks alcohol. She reports that she does not use drugs.  Allergies:  Allergies  Allergen Reactions  . Aspirin Shortness Of Breath      Review of Systems  She takes inhalers as needed for asthma   Examination:   BP 140/80 (BP Location: Left Arm, Patient Position: Sitting, Cuff Size: Normal)   Pulse 82   Ht 5\' 9"  (1.753 m)   Wt 168 lb 12.8 oz (76.6 kg)   SpO2 98%   BMI 24.93 kg/m          The thyroid is still quite enlarged and nodular  Right lobe is enlarged about 3 times normal, nodular, firm  The isthmus is enlarged with a 2.5 cm nodule  Left lobe is firm also and nodular, overall about twice normal  Neurological: Deep tendon reflexes at biceps are slightly brisk No tremor  Skin is not warm    Assessment/Plan:   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   She has had her radioactive iodine treatment in 05/2017 and continues to be on methimazole However even though it has been over 2 months since her I-131 treatment her T3 level is still above normal Subjectively she is doing significantly better and she has more energy and has gained back some weight  She still has a significant nodular goiter, may be slightly smaller on the right side  Since her thyroid levels are much easier to control now expect that she should become euthyroid within the next 2 to 3 weeks and will start tapering  down her methimazole dose and stop in about 2 weeks  We will have her scheduled for  follow-up with labs in 4-5 weeks  Total visit time 15 minutes  Patient Instructions  Take 2 pills 2x daily for 1 week, then 1 pill 2x for 1 week then none     [name] 08/24/2017, 8:58 AM

## 2017-10-08 ENCOUNTER — Other Ambulatory Visit (INDEPENDENT_AMBULATORY_CARE_PROVIDER_SITE_OTHER): Payer: Medicare HMO

## 2017-10-08 DIAGNOSIS — E0520 Thyrotoxicosis with toxic multinodular goiter without thyrotoxic crisis or storm: Secondary | ICD-10-CM

## 2017-10-08 LAB — T3, FREE: T3 FREE: 7 pg/mL — AB (ref 2.3–4.2)

## 2017-10-08 LAB — ALT: ALT: 10 U/L (ref 0–35)

## 2017-10-08 LAB — T4, FREE: Free T4: 2.51 ng/dL — ABNORMAL HIGH (ref 0.60–1.60)

## 2017-10-08 LAB — TSH: TSH: <0.01 u[IU]/mL — ABNORMAL LOW (ref 0.35–4.50)

## 2017-10-11 ENCOUNTER — Ambulatory Visit (INDEPENDENT_AMBULATORY_CARE_PROVIDER_SITE_OTHER): Payer: Medicare HMO | Admitting: Endocrinology

## 2017-10-11 ENCOUNTER — Encounter: Payer: Self-pay | Admitting: Endocrinology

## 2017-10-11 VITALS — BP 148/80 | HR 74 | Ht 69.0 in | Wt 168.0 lb

## 2017-10-11 DIAGNOSIS — E0520 Thyrotoxicosis with toxic multinodular goiter without thyrotoxic crisis or storm: Secondary | ICD-10-CM

## 2017-10-11 MED ORDER — METOPROLOL SUCCINATE ER 25 MG PO TB24: 25.0000 mg | ORAL_TABLET | Freq: Every day | ORAL | 0 refills | Status: DC

## 2017-10-11 NOTE — Patient Instructions (Signed)
Stay on methimazole   when you are scheduled for the testing for the radioactive iodine treatment please note the following instructions:  7 days before the scheduled test dose stop the methimazole medication Restart the methimazole 3 days after the actual radioactive iodine treatment

## 2017-10-11 NOTE — Progress Notes (Signed)
Patient ID: [name], female   DOB: 1945/09/05, 72 y.o.   MRN: [id number]                                                                                                                 Reason for Appointment:  Hyperthyroidism, follow-up    Chief complaint: Follow-up of thyroid   History of Present Illness:   History obtained on patient's initial visit: Patient had started losing weight in August 2017 and apparently had lost a total of about 30 pounds last year despite an increased appetite She did not discuss this with any physician Over the last couple of months also she has noticed that she is feeling excessively warm and sometimes sweaty.  Also has had tiredness. She does not think she has had significant symptoms of palpitations, shakiness, nervousness or change in bowel habits In early February she was admitted to the hospital for shortness of breath and worsening of her asthma She had a significantly high pulse rate and was found to be hyperthyroid She was started on methimazole 20 mg twice a day on her discharge on 03/31/16  Recent history: Also she was supposed to have her I-131 treatment in 5/18 she has either canceled or no showed for her treatments She finally had her I-131 treatment 06/15/2017 with 28.9 mCi  She did have improvement in her thyroid levels and free T4 was back to normal in July and was told to stop her methimazole after a couple of weeks but she did not do so;, now taking 30 mg twice daily reportedly She does not have metoprolol at home  However she thinks that she is still having good energy level without any change in appetite and no fatigue or heat intolerance No weight change  Her thyroid scan previously showed hot and cold nodules and uptake of 54% of the first measurement and follow-up measurement in 12/18 was 76.8%  Wt Readings from Last 3 Encounters:  10/11/17 168 lb (76.2 kg)  08/24/17 168 lb 12.8 oz (76.6 kg)  07/03/17 163 lb 3.2 oz (74 kg)         Thyroid function tests as follows:     Lab Results  Component Value Date   FREET4 2.51 (H) 10/08/2017   FREET4 1.60 08/22/2017   FREET4 3.11 (H) 07/03/2017   FREET4 3.07 (H) 05/22/2017   T3FREE 7.0 (H) 10/08/2017   T3FREE 5.0 (H) 08/22/2017   T3FREE 9.9 (H) 07/03/2017   T3FREE 8.9 (H) 05/22/2017    Lab Results  Component Value Date   TSH <0.01 (L) 10/08/2017     No results found for: THYROTRECAB     Allergies as of 10/11/2017      Reactions   Aspirin Shortness Of Breath      Medication List        Accurate as of 10/11/17  3:33 PM. Always use your most recent med list.          acetaminophen 325 MG tablet Commonly known as:  TYLENOL Take  325-650 mg by mouth every 6 (six) hours as needed for mild pain or headache.   albuterol (2.5 MG/3ML) 0.083% nebulizer solution Commonly known as:  PROVENTIL Take 3 mLs (2.5 mg total) by nebulization every 6 (six) hours as needed. asthma   albuterol 108 (90 Base) MCG/ACT inhaler Commonly known as:  PROVENTIL HFA;VENTOLIN HFA Inhale 2 puffs into the lungs every 6 (six) hours as needed for wheezing.   budesonide-formoterol 160-4.5 MCG/ACT inhaler Commonly known as:  SYMBICORT Inhale 2 puffs into the lungs 2 (two) times daily.   famotidine 20 MG tablet Commonly known as:  PEPCID One at bedtime   fluticasone 50 MCG/ACT nasal spray Commonly known as:  FLONASE Place 2 sprays into the nose daily.   methimazole 10 MG tablet Commonly known as:  TAPAZOLE 3 tablets twice daily   metoprolol succinate 25 MG 24 hr tablet Commonly known as:  TOPROL-XL Take 1 tablet (25 mg total) by mouth daily.   montelukast 10 MG tablet Commonly known as:  SINGULAIR Take 1 tablet (10 mg total) by mouth at bedtime.   pantoprazole 40 MG tablet Commonly known as:  PROTONIX Take 1 tablet (40 mg total) by mouth daily before breakfast.           Past Medical History:  Diagnosis Date  . Asthma   . GERD (gastroesophageal reflux  disease)   . Heart murmur   . Pneumonia 03/28/2016    Past Surgical History:  Procedure Laterality Date  . CATARACT EXTRACTION W/ INTRAOCULAR LENS  IMPLANT, BILATERAL  2011 &2009  . TONSILLECTOMY    . TUBAL LIGATION      Family History  Problem Relation Age of Onset  . Asthma Sister   . Emphysema Father        was a smoker  . Hypertension Mother   . Hypertension Sister   . Thyroid disease Neg Hx   . Diabetes Neg Hx     Social History:  reports that she has never smoked. She has never used smokeless tobacco. She reports that she drinks alcohol. She reports that she does not use drugs.  Allergies:  Allergies  Allergen Reactions  . Aspirin Shortness Of Breath      Review of Systems  She takes inhalers as needed for asthma   Examination:   BP (!) 148/80   Pulse 74   Ht 5\' 9"  (1.753 m)   Wt 168 lb (76.2 kg)   SpO2 98%   BMI 24.81 kg/m          Repeat pulse 80  .Right lobe is enlarged about 3 times normal and partly under the sternomastoid laterally, smooth and slightly firm  The isthmus is enlarged with a 2-2.5 cm nodule  Left lobe is firm significantly nodular, measuring about twice normal  Neurological: Deep tendon reflexes at biceps are brisk No tremor  Skin is not warm    Assessment/Plan:   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   She has had her radioactive iodine treatment in 05/2017 and continues to be hyperthyroid Surprisingly has not had correlation of her thyroid levels and her symptoms  Patient does not appear to be understanding her medication instructions at all and still continues to be on methimazole even though this was supposed to be tapered off on the last visit with specific written instructions  Since it has been about 4 months since her I-131 treatment and her levels are rising she has not had complete treatment for her toxic nodular goiter which  has been significantly enlarged and markedly hyperthyroid previously  Since she is  reportedly taking 60 mg of methimazole without any improvement she does need a repeat treatment also  Schedule I-123 uptake and scan and decide on doses based on this Explained to her the instructions for methimazole and to stop this will be before her scheduled scan date  We will have her scheduled for follow-up with labs in 6 weeks  Total visit time 15 minutes  There are no Patient Instructions on file for this visit.    Elayne Snare 10/11/2017, 3:33 PM

## 2017-10-12 ENCOUNTER — Other Ambulatory Visit: Payer: Self-pay

## 2017-10-12 ENCOUNTER — Emergency Department (HOSPITAL_COMMUNITY)
Admission: EM | Admit: 2017-10-12 | Discharge: 2017-10-12 | Disposition: A | Discharge status: Home / Self Care | Payer: Medicare HMO | Attending: Emergency Medicine | Admitting: Emergency Medicine

## 2017-10-12 ENCOUNTER — Encounter (HOSPITAL_COMMUNITY): Payer: Self-pay

## 2017-10-12 DIAGNOSIS — R220 Localized swelling, mass and lump, head: Secondary | ICD-10-CM | POA: Diagnosis not present

## 2017-10-12 DIAGNOSIS — R600 Localized edema: Secondary | ICD-10-CM | POA: Insufficient documentation

## 2017-10-12 DIAGNOSIS — J45909 Unspecified asthma, uncomplicated: Secondary | ICD-10-CM | POA: Diagnosis not present

## 2017-10-12 DIAGNOSIS — Z79899 Other long term (current) drug therapy: Secondary | ICD-10-CM | POA: Insufficient documentation

## 2017-10-12 DIAGNOSIS — E0500 Thyrotoxicosis with diffuse goiter without thyrotoxic crisis or storm: Secondary | ICD-10-CM | POA: Insufficient documentation

## 2017-10-12 MED ORDER — PREDNISONE 10 MG PO TABS: 40.0000 mg | ORAL_TABLET | Freq: Every day | ORAL | 0 refills | Status: DC

## 2017-10-12 MED ORDER — DIPHENHYDRAMINE HCL 25 MG PO CAPS
25.0000 mg | ORAL_CAPSULE | Freq: Once | ORAL | Status: AC
  Administered 2017-10-12: 25 mg via ORAL
  Filled 2017-10-12: qty 1

## 2017-10-12 MED ORDER — PREDNISONE 20 MG PO TABS
60.0000 mg | ORAL_TABLET | Freq: Once | ORAL | Status: AC
  Administered 2017-10-12: 60 mg via ORAL
  Filled 2017-10-12: qty 3

## 2017-10-12 NOTE — ED Notes (Signed)
Family at bedside. 

## 2017-10-12 NOTE — ED Provider Notes (Signed)
[facility] Provider Note   CSN: [id number] Arrival date & time: 10/12/17  1625     History   Chief Complaint Chief Complaint  Patient presents with  . Facial Swelling    HPI [name] is a 72 y.o. female.  HPI 72 year old female with history of asthma and GERD presents to the emergency department today for evaluation of facial swelling.  States that she has been feeling well with no recent illnesses however she awoke from taking a nap today at 3 PM and noticed left-sided facial swelling.  States that she did not feel any swelling or pain however did notice the swelling whenever she looked into a mirror.  Denies any throat swelling sensation, difficulty breathing, wheezing, or tongue swelling.  No known allergen exposures.  No history of facial swelling or anaphylaxis in the past.  Denies any recent changes in her medications.  Does not take ACE inhibitor.  Denies any rash or other symptoms at this time.  Reports feeling well.  Took no medications prior to coming to the emergency department.  Received Benadryl in triage and states that she feels like she is getting better.  No new exposures including make-up, soaps, or other products.  Past Medical History:  Diagnosis Date  . Asthma   . GERD (gastroesophageal reflux disease)   . Heart murmur   . Pneumonia 03/28/2016    Patient Active Problem List   Diagnosis Date Noted  . Syncope 12/22/2016  . Hyperthyroidism 12/22/2016  . Alkaline phosphatase elevation 11/08/2016  . Anemia 06/30/2016  . GERD (gastroesophageal reflux disease) 06/30/2016  . Elevated BP without diagnosis of hypertension 05/01/2016  . Solitary pulmonary nodule 04/10/2016  . Influenza with pneumonia 03/29/2016  . Nonspecific abnormal electrocardiogram (ECG) (EKG) 03/29/2016  . Polycythemia 02/23/2015  . Hyperglycemia 02/23/2015  . Asthma, chronic 05/04/2012  . Acute asthma exacerbation 03/31/2012  . Hypokalemia  03/31/2012    Past Surgical History:  Procedure Laterality Date  . CATARACT EXTRACTION W/ INTRAOCULAR LENS  IMPLANT, BILATERAL  2011 &2009  . TONSILLECTOMY    . TUBAL LIGATION       OB History   None      Home Medications    Prior to Admission medications   Medication Sig Start Date End Date Taking? Authorizing Provider  acetaminophen (TYLENOL) 325 MG tablet Take 325-650 mg by mouth every 6 (six) hours as needed for mild pain or headache.    [provider]  albuterol (PROVENTIL HFA;VENTOLIN HFA) 108 (90 Base) MCG/ACT inhaler Inhale 2 puffs into the lungs every 6 (six) hours as needed for wheezing. 02/27/15   Thurnell Lose, MD  albuterol (PROVENTIL) (2.5 MG/3ML) 0.083% nebulizer solution Take 3 mLs (2.5 mg total) by nebulization every 6 (six) hours as needed. asthma 04/05/12   Bonnielee Haff, MD  budesonide-formoterol [facility]) 160-4.5 MCG/ACT inhaler Inhale 2 puffs into the lungs 2 (two) times daily. 01/15/17   Tanda Rockers, MD  famotidine (PEPCID) 20 MG tablet One at bedtime Patient taking differently: Take 20 mg by mouth at bedtime. One at bedtime 11/26/15   Tanda Rockers, MD  fluticasone [facility]) 50 MCG/ACT nasal spray Place 2 sprays into the nose daily. Patient taking differently: Place 2 sprays into both nostrils daily as needed for allergies.  11/28/11   Tanda Rockers, MD  methimazole (TAPAZOLE) 10 MG tablet 3 tablets twice daily 08/16/17   Elayne Snare, MD  metoprolol succinate (TOPROL-XL) 25 MG 24 hr tablet Take 1  tablet (25 mg total) by mouth daily. 10/11/17   Elayne Snare, MD  montelukast (SINGULAIR) 10 MG tablet Take 1 tablet (10 mg total) by mouth at bedtime. 01/15/17   Tanda Rockers, MD  pantoprazole (PROTONIX) 40 MG tablet Take 1 tablet (40 mg total) by mouth daily before breakfast. 12/16/15   Tanda Rockers, MD  predniSONE (DELTASONE) 10 MG tablet Take 4 tablets (40 mg total) by mouth daily for 3 days. 10/12/17 10/15/17  Corrie Dandy, MD    Family  History Family History  Problem Relation Age of Onset  . Asthma Sister   . Emphysema Father        was a smoker  . Hypertension Mother   . Hypertension Sister   . Thyroid disease Neg Hx   . Diabetes Neg Hx     Social History Social History   Tobacco Use  . Smoking status: Never Smoker  . Smokeless tobacco: Never Used  Substance Use Topics  . Alcohol use: Yes    Comment: maybe 1-2 times annually  . Drug use: No     Allergies   Aspirin   Review of Systems Review of Systems  Constitutional: Negative for chills and fever.  HENT: Positive for facial swelling. Negative for congestion, drooling, mouth sores, sore throat, trouble swallowing and voice change.   Eyes: Negative for visual disturbance.  Respiratory: Negative for cough, shortness of breath and wheezing.   Cardiovascular: Negative for chest pain and leg swelling.  Gastrointestinal: Negative for abdominal pain, diarrhea, nausea and vomiting.  Genitourinary: Negative for dysuria and hematuria.  Musculoskeletal: Negative for back pain and neck pain.  Skin: Negative for color change and rash.  Neurological: Negative for weakness and headaches.  All other systems reviewed and are negative.    Physical Exam Updated Vital Signs BP (!) 156/73   Pulse 95   Temp 99 F (37.2 C) (Oral)   Resp 16   Ht 5\' 9"  (1.753 m)   Wt 76.2 kg   SpO2 92%   BMI 24.81 kg/m   Physical Exam  Constitutional: She appears well-developed and well-nourished. No distress.  HENT:  Head: Normocephalic and atraumatic.  Patient has swelling limited to the upper lip as well as underneath the left eye.  No intraoral swelling.  Uvula midline.  No uvula edema.  Phonating clearly.  No drooling.  Tolerating secretions.  Full range of motion of neck without pain.  Eyes: Conjunctivae are normal.  EOMI w/o pain  Neck: Neck supple.  Cardiovascular: Regular rhythm and intact distal pulses.  Pulmonary/Chest: Effort normal and breath sounds normal.  No stridor. No respiratory distress. She has no wheezes.  Abdominal: Soft. She exhibits no distension. There is no tenderness.  Musculoskeletal: She exhibits no edema.  Neurological: She is alert.  Skin: Skin is warm and dry.  Psychiatric: She has a normal mood and affect.  Nursing note and vitals reviewed.    ED Treatments / Results  Labs (all labs ordered are listed, but only abnormal results are displayed) Labs Reviewed - No data to display  EKG None  Radiology No results found.  Procedures Procedures (including critical care time)  Medications Ordered in ED Medications  diphenhydrAMINE (BENADRYL) capsule 25 mg (25 mg Oral Given 10/12/17 1649)  predniSONE (DELTASONE) tablet 60 mg (60 mg Oral Given 10/12/17 1814)     Initial Impression / Assessment and Plan / ED Course  I have reviewed the triage vital signs and the nursing notes.  Pertinent labs &  imaging results that were available during my care of the patient were reviewed by me and considered in my medical decision making (see chart for details).     72 year old female presents for left-sided facial swelling.  Afebrile, hemodynamically stable, well-appearing.  Exam as above.  No signs of anaphylaxis.  No signs of orbital/periorbital cellulitis.  No infectious signs or symptoms.  No signs of trauma.  No known allergen exposures.  Could be allergic reaction.  Does not appear consistent with angioedema.  No intraoral or pharyngeal involvement.  Monitored in the ED for approximately 4 hours from time of symptom onset with no worsening.  Appears to be improving.  Continues to phonate normally and tolerate secretions.  Steroids given in the emergency department as well as Benadryl.  Will discharge with prescription for steroids and encouraged Benadryl use.  Advised to follow closely with PCP.  Strict return precautions provided.  Stable at discharge.  Case and plan of care discussed with Dr. Wilson Singer.  Final Clinical  Impressions(s) / ED Diagnoses   Final diagnoses:  Facial swelling    ED Discharge Orders         Ordered    predniSONE (DELTASONE) 10 MG tablet  Daily     10/12/17 1911           Corrie Dandy, MD 10/12/17 Lanetta Inch    Virgel Manifold, MD 10/18/17 1432

## 2017-10-12 NOTE — ED Triage Notes (Signed)
Pt presents with left sided facial swelling that started after she woke up from a nap around 3pm today. Pt reports allergy to aspirin but states she has not had any in years. Denies difficulty breathing or swallowing.

## 2017-10-12 NOTE — Discharge Instructions (Addendum)
As we have discussed there is no clear diagnosis for your facial swelling.  It could be allergic reaction.  If you can think of any new exposures to make-up, home products, soaps pets or other possible allergens recommend removing these from your house.  Take steroids as prescribed.  May take Benadryl 25-50 mg every 6 hours as needed if swelling persists or develop rash.  Do not drive or operate heavy machinery while taking this medication.  Return to the emergency department if symptoms worsen, shortness of breath, throat swelling, tongue swelling, difficulty swallowing or breathing, fevers or other concerning symptoms.

## 2017-10-12 NOTE — ED Provider Notes (Signed)
Patient placed in Quick Look pathway, seen and evaluated   Chief Complaint: Facial swelling  HPI:   Patient presents today for evaluation of facial swelling.  She laid down at around noon for nap and when she woke up she has swelling on the left side of her face, along with bilaterally on the upper lip.  She denies any new exposures.  She does not take lisinopril.  She denies any difficulty breathing or swallowing, is not sure if it is gotten worse since she woke up.  ROS: No fevers, no dental or facial pain  Physical Exam:   Gen: No distress  Neuro: Awake and Alert  Skin: Warm    Focused Exam: Upper lip is swollen bilaterally.  There is obvious swelling under the left eye.  There is swelling along the left mandible.  There is no elevation of the floor the mouth, speech is normal, lungs are clear to auscultation bilaterally without stridor.   Initiation of care has begun. The patient has been counseled on the process, plan, and necessity for staying for the completion/evaluation, and the remainder of the medical screening examination.     Lorin Glass, PA-C 10/12/17 1656    Virgel Manifold, MD 10/18/17 1431

## 2017-10-15 ENCOUNTER — Ambulatory Visit (INDEPENDENT_AMBULATORY_CARE_PROVIDER_SITE_OTHER): Payer: Medicare HMO | Admitting: Nurse Practitioner

## 2017-10-15 ENCOUNTER — Encounter: Payer: Self-pay | Admitting: Nurse Practitioner

## 2017-10-15 VITALS — BP 130/70 | HR 97 | Temp 98.7°F | Ht 69.0 in | Wt 160.0 lb

## 2017-10-15 DIAGNOSIS — R220 Localized swelling, mass and lump, head: Secondary | ICD-10-CM | POA: Diagnosis not present

## 2017-10-15 LAB — CBC WITH DIFFERENTIAL/PLATELET
BASOS ABS: 0.1 10*3/uL (ref 0.0–0.1)
Basophils Relative: 1.5 % (ref 0.0–3.0)
EOS PCT: 0.9 % (ref 0.0–5.0)
Eosinophils Absolute: 0.1 10*3/uL (ref 0.0–0.7)
HCT: 34.8 % — ABNORMAL LOW (ref 36.0–46.0)
HEMOGLOBIN: 10.9 g/dL — AB (ref 12.0–15.0)
Lymphocytes Relative: 9.4 % — ABNORMAL LOW (ref 12.0–46.0)
Lymphs Abs: 0.8 10*3/uL (ref 0.7–4.0)
MCHC: 31.4 g/dL (ref 30.0–36.0)
MCV: 73.1 fl — ABNORMAL LOW (ref 78.0–100.0)
MONO ABS: 0.4 10*3/uL (ref 0.1–1.0)
MONOS PCT: 4.4 % (ref 3.0–12.0)
NEUTROS PCT: 83.8 % — AB (ref 43.0–77.0)
Neutro Abs: 7.5 10*3/uL (ref 1.4–7.7)
Platelets: 219 10*3/uL (ref 150.0–400.0)
RBC: 4.75 Mil/uL (ref 3.87–5.11)
RDW: 17.2 % — ABNORMAL HIGH (ref 11.5–15.5)
WBC: 9 10*3/uL (ref 4.0–10.5)

## 2017-10-15 LAB — SEDIMENTATION RATE: SED RATE: 112 mm/h — AB (ref 0–30)

## 2017-10-15 NOTE — Progress Notes (Signed)
Subjective:  Patient ID: [name], female    DOB: April 15, 1945  Age: 72 y.o. MRN: [id number]  CC: Follow-up (ED follow up for left side face swelling and painful, hear echo in left ear and pain going toward nose. this start 3 days ago. ED gave prednison and benadryl. getting better. )  Other  This is a new (left facial swelling and pain) problem. The current episode started in the past 7 days. The problem occurs constantly. The problem has been unchanged. Pertinent negatives include no abdominal pain, anorexia, chills, fever, headaches, nausea, neck pain, rash, sore throat, swollen glands, vertigo or weakness. Associated symptoms comments: Ear fullness, and sinus pressure, . Nothing aggravates the symptoms. She has tried NSAIDs (oral prednisone and benadryl) for the symptoms. The treatment provided mild relief.  denies any new medication or dose, no new food item, no new personal hygiene item, no recent dental work Evaluated in ED, treated for possible allergic reaction with oral prednisone and bendryl  Reviewed past Medical, Social and Family history today.  Outpatient Medications Prior to Visit  Medication Sig Dispense Refill  . acetaminophen (TYLENOL) 325 MG tablet Take 325-650 mg by mouth every 6 (six) hours as needed for mild pain or headache.    . albuterol (PROVENTIL HFA;VENTOLIN HFA) 108 (90 Base) MCG/ACT inhaler Inhale 2 puffs into the lungs every 6 (six) hours as needed for wheezing. 1 Inhaler 1  . albuterol (PROVENTIL) (2.5 MG/3ML) 0.083% nebulizer solution Take 3 mLs (2.5 mg total) by nebulization every 6 (six) hours as needed. asthma 75 mL 0  . budesonide-formoterol (SYMBICORT) 160-4.5 MCG/ACT inhaler Inhale 2 puffs into the lungs 2 (two) times daily. 3 Inhaler 3  . famotidine (PEPCID) 20 MG tablet One at bedtime (Patient taking differently: Take 20 mg by mouth at bedtime. One at bedtime)    . fluticasone (FLONASE) 50 MCG/ACT nasal spray Place 2 sprays into the nose daily.  (Patient taking differently: Place 2 sprays into both nostrils daily as needed for allergies. ) 48 g 3  . methimazole (TAPAZOLE) 10 MG tablet 3 tablets twice daily 180 tablet 1  . metoprolol succinate (TOPROL-XL) 25 MG 24 hr tablet Take 1 tablet (25 mg total) by mouth daily. 30 tablet 0  . montelukast (SINGULAIR) 10 MG tablet Take 1 tablet (10 mg total) by mouth at bedtime. 90 tablet 3  . pantoprazole (PROTONIX) 40 MG tablet Take 1 tablet (40 mg total) by mouth daily before breakfast. 90 tablet 4  . predniSONE (DELTASONE) 10 MG tablet Take 4 tablets (40 mg total) by mouth daily for 3 days. 12 tablet 0   No facility-administered medications prior to visit.     ROS See HPI  Objective:  BP 130/70   Pulse 97   Temp 98.7 F (37.1 C) (Oral)   Ht 5\' 9"  (1.753 m)   Wt 160 lb (72.6 kg)   SpO2 98%   BMI 23.63 kg/m   BP Readings from Last 3 Encounters:  10/15/17 130/70  10/12/17 (!) 156/73  10/11/17 (!) 148/80    Wt Readings from Last 3 Encounters:  10/15/17 160 lb (72.6 kg)  10/12/17 167 lb 15.9 oz (76.2 kg)  10/11/17 168 lb (76.2 kg)    Physical Exam  Constitutional: No distress.  HENT:  Head:    Right Ear: External ear normal.  Left Ear: External ear normal.  Mouth/Throat: Oropharynx is clear and moist. No trismus in the jaw. Abnormal dentition. Dental caries present. No oropharyngeal exudate or posterior oropharyngeal  erythema.  Some missing upper teeth. Left upper premolar with plaque. No obvious gum swelling and erythema.  Eyes: Pupils are equal, round, and reactive to light. Conjunctivae and EOM are normal.  Neck: Normal range of motion. Neck supple.  Cardiovascular: Normal rate.  Pulmonary/Chest: Effort normal and breath sounds normal. No stridor. No respiratory distress.  Lymphadenopathy:    She has no cervical adenopathy.  Skin: There is erythema.  Psychiatric: She has a normal mood and affect. [name] behavior is normal.  Vitals reviewed.   Lab Results    Component Value Date   WBC 9.0 10/15/2017   HGB 10.9 (L) 10/15/2017   HCT 34.8 (L) 10/15/2017   PLT 219.0 10/15/2017   GLUCOSE 158 (H) 12/23/2016   CHOL 107 08/04/2016   TRIG 76.0 08/04/2016   HDL 45.00 08/04/2016   LDLCALC 47 08/04/2016   ALT 10 10/08/2017   AST 22 12/22/2016   NA 140 12/23/2016   K 3.4 (L) 12/23/2016   CL 109 12/23/2016   CREATININE 0.67 12/23/2016   BUN 6 12/23/2016   CO2 23 12/23/2016   TSH <0.01 (L) 10/08/2017   INR 0.99 03/31/2012   HGBA1C 6.2 08/04/2016    No results found.  Assessment & Plan:   [name] was seen today for follow-up.  Diagnoses and all orders for this visit:  Left facial swelling -     CBC w/Diff -     Sedimentation rate -     CT MAXILLOFACIAL WO CONTRAST; Future -     C-reactive protein  Localized swelling, mass, and lump of head -     CT MAXILLOFACIAL WO CONTRAST; Future   I am having [name] maintain [name] fluticasone, albuterol, albuterol, famotidine, pantoprazole, acetaminophen, montelukast, budesonide-formoterol, methimazole, metoprolol succinate, and predniSONE.  No orders of the defined types were placed in this encounter.   Follow-up: Return if symptoms worsen or fail to improve.  Wilfred Lacy, NP

## 2017-10-15 NOTE — Patient Instructions (Addendum)
Left voice message to call back about lab results and CT appointment. CBC indicates elevated neutrophil Sed rate is also elevated. These number mean possible bacterial infection. Sent additional oral prednisone and oral abx. It is imperative that we schedule your CT sooner than 10/18/17 to rule out sinus or dental abscess.  Return to ED if symptoms worsen

## 2017-10-16 ENCOUNTER — Encounter: Payer: Self-pay | Admitting: Nurse Practitioner

## 2017-10-16 MED ORDER — PREDNISONE 20 MG PO TABS: 40.0000 mg | ORAL_TABLET | Freq: Every day | ORAL | 0 refills | Status: AC

## 2017-10-16 MED ORDER — AMOXICILLIN-POT CLAVULANATE 875-125 MG PO TABS: 1.0000 | ORAL_TABLET | Freq: Two times a day (BID) | ORAL | 0 refills | Status: DC

## 2017-10-18 ENCOUNTER — Encounter (HOSPITAL_COMMUNITY): Payer: Self-pay | Admitting: Emergency Medicine

## 2017-10-18 ENCOUNTER — Other Ambulatory Visit: Payer: Self-pay

## 2017-10-18 ENCOUNTER — Emergency Department (HOSPITAL_COMMUNITY)
Admission: EM | Admit: 2017-10-18 | Discharge: 2017-10-18 | Disposition: A | Discharge status: Home / Self Care | Payer: Medicare HMO | Attending: Emergency Medicine | Admitting: Emergency Medicine

## 2017-10-18 ENCOUNTER — Ambulatory Visit (INDEPENDENT_AMBULATORY_CARE_PROVIDER_SITE_OTHER)
Admission: RE | Admit: 2017-10-18 | Discharge: 2017-10-18 | Disposition: A | Discharge status: Home / Self Care | Payer: Medicare HMO | Source: Ambulatory Visit | Attending: Nurse Practitioner | Admitting: Nurse Practitioner

## 2017-10-18 DIAGNOSIS — Z79899 Other long term (current) drug therapy: Secondary | ICD-10-CM | POA: Diagnosis not present

## 2017-10-18 DIAGNOSIS — R51 Headache: Secondary | ICD-10-CM | POA: Diagnosis not present

## 2017-10-18 DIAGNOSIS — J45909 Unspecified asthma, uncomplicated: Secondary | ICD-10-CM | POA: Insufficient documentation

## 2017-10-18 DIAGNOSIS — G501 Atypical facial pain: Secondary | ICD-10-CM | POA: Insufficient documentation

## 2017-10-18 DIAGNOSIS — R220 Localized swelling, mass and lump, head: Secondary | ICD-10-CM | POA: Diagnosis not present

## 2017-10-18 DIAGNOSIS — K047 Periapical abscess without sinus: Secondary | ICD-10-CM | POA: Diagnosis not present

## 2017-10-18 LAB — BASIC METABOLIC PANEL
Anion gap: 12 (ref 5–15)
BUN: 10 mg/dL (ref 8–23)
CO2: 24 mmol/L (ref 22–32)
Calcium: 8.9 mg/dL (ref 8.9–10.3)
Chloride: 105 mmol/L (ref 98–111)
Creatinine, Ser: 0.72 mg/dL (ref 0.44–1.00)
GFR calc Af Amer: >60 mL/min (ref >60)
GFR calc non Af Amer: >60 mL/min (ref >60)
Glucose, Bld: 106 mg/dL — ABNORMAL HIGH (ref 70–99)
Potassium: 3.5 mmol/L (ref 3.5–5.1)
Sodium: 141 mmol/L (ref 135–145)

## 2017-10-18 LAB — CBC WITH DIFFERENTIAL/PLATELET
Abs Immature Granulocytes: 0 10*3/uL (ref 0.0–0.1)
Basophils Absolute: 0.1 10*3/uL (ref 0.0–0.1)
Basophils Relative: 1 %
Eosinophils Absolute: 0.2 10*3/uL (ref 0.0–0.7)
Eosinophils Relative: 2 %
HCT: 34.4 % — ABNORMAL LOW (ref 36.0–46.0)
Hemoglobin: 10.3 g/dL — ABNORMAL LOW (ref 12.0–15.0)
Immature Granulocytes: 0 %
Lymphocytes Relative: 16 %
Lymphs Abs: 2 10*3/uL (ref 0.7–4.0)
MCH: 22.9 pg — ABNORMAL LOW (ref 26.0–34.0)
MCHC: 29.9 g/dL — ABNORMAL LOW (ref 30.0–36.0)
MCV: 76.4 fL — ABNORMAL LOW (ref 78.0–100.0)
Monocytes Absolute: 1.1 10*3/uL — ABNORMAL HIGH (ref 0.1–1.0)
Monocytes Relative: 9 %
Neutro Abs: 9.1 10*3/uL — ABNORMAL HIGH (ref 1.7–7.7)
Neutrophils Relative %: 72 %
Platelets: 269 10*3/uL (ref 150–400)
RBC: 4.5 MIL/uL (ref 3.87–5.11)
RDW: 15.9 % — ABNORMAL HIGH (ref 11.5–15.5)
WBC: 12.5 10*3/uL — ABNORMAL HIGH (ref 4.0–10.5)

## 2017-10-18 MED ORDER — HYDROCODONE-ACETAMINOPHEN 5-325 MG PO TABS: 1.0000 | ORAL_TABLET | Freq: Four times a day (QID) | ORAL | 0 refills | Status: DC | PRN

## 2017-10-18 MED ORDER — AMOXICILLIN-POT CLAVULANATE 875-125 MG PO TABS: 1.0000 | ORAL_TABLET | Freq: Two times a day (BID) | ORAL | 0 refills | Status: DC

## 2017-10-18 MED ORDER — HYDROCODONE-ACETAMINOPHEN 5-325 MG PO TABS
1.0000 | ORAL_TABLET | Freq: Once | ORAL | Status: AC
  Administered 2017-10-18: 1 via ORAL
  Filled 2017-10-18: qty 1

## 2017-10-18 MED ORDER — AMOXICILLIN-POT CLAVULANATE 875-125 MG PO TABS
1.0000 | ORAL_TABLET | Freq: Once | ORAL | Status: AC
  Administered 2017-10-18: 1 via ORAL
  Filled 2017-10-18: qty 1

## 2017-10-18 NOTE — ED Triage Notes (Addendum)
Patient to ED for persistent L sided facial swelling - was seen here on 8/23 and followed up with her family doctor Friday, who gave her a 3-day supply of prednisone. She states it got a little better but came right back. States she feels like the L side of her face is tight, tender to touch, pain with chewing, unable to breathe out of nose. Airway intact, pt talking in clear, full sentences. CT maxillofacial done today at [facility].

## 2017-10-18 NOTE — Discharge Instructions (Signed)
Please read attached information. If you experience any new or worsening signs or symptoms please return to the emergency room for evaluation. Please follow-up with your primary care provider or specialist as discussed. Please use medication prescribed only as directed and discontinue taking if you have any concerning signs or symptoms.   °

## 2017-10-18 NOTE — ED Provider Notes (Signed)
Patient placed in Quick Look pathway, seen and evaluated   Chief Complaint: facial swelling   HPI:   [name] is a 72 y.o. female who present to the ED with facial swelling and pain. Patient reports she was here last week and treated for facial swelling and possible allergic reaction. Patient went to her PCP today and a CT scan was ordered. CT shows a dental abscess 2.3 cm left maxillary area.   ROS: Skin: facial swelling left side  Physical Exam:  BP (!) 156/69 (BP Location: Right Arm)   Pulse 94   Temp 98.4 F (36.9 C) (Oral)   Resp 16   SpO2 99%    Gen: No distress  Neuro: Awake and Alert  Skin: swelling of face left  HEENT: facial swelling, tender on exam of left upper dental area.    Ct Maxillofacial Wo Contrast  Result Date: 10/18/2017 CLINICAL DATA:  Left facial swelling and pain. EXAM: CT MAXILLOFACIAL WITHOUT CONTRAST TECHNIQUE: Multidetector CT imaging of the maxillofacial structures was performed. Multiplanar CT image reconstructions were also generated. COMPARISON:  Head CT 12/22/2016 FINDINGS: Osseous: Scattered dental caries are noted. There is periapical lucency associated with the left maxillary canine with disruption of the buccal cortex and an overlying 2.3 x 1.3 cm fluid collection with surrounding soft tissue inflammation. No acute fracture. Moderate cervical disc and facet degeneration. Orbits: Bilateral cataract extraction. Sinuses: Postsurgical changes in the maxillary sinuses. 2.5 cm right maxillary sinus polyp or mucous retention cyst. Mild left maxillary and bilateral ethmoid sinus mucosal thickening. Multiple small polypoid masses in the nasal cavity bilaterally. Clear mastoid air cells. Soft tissues: Soft tissue swelling/inflammation throughout the left face. Limited intracranial: Unremarkable. IMPRESSION: 2.3 cm fluid collection consistent with abscess anterior to the left maxilla associated with left maxillary canine periodontal disease. Electronically  Signed   By: Logan Bores M.D.   On: 10/18/2017 12:40     Initiation of care has begun. The patient has been counseled on the process, plan, and necessity for staying for the completion/evaluation, and the remainder of the medical screening examination    Ashley Murrain, NP 10/18/17 1828    Hayden Rasmussen, MD 10/19/17 [phone]

## 2017-10-18 NOTE — ED Provider Notes (Signed)
[facility] EMERGENCY DEPARTMENT Provider Note   CSN: [id number] Arrival date & time: 10/18/17  1803     History   Chief Complaint Chief Complaint  Patient presents with  . Facial Swelling    HPI [name] is a 72 y.o. female.  HPI   72 year old female presents today with complaints of facial swelling.patient has approximately one week ago she developed swelling to the left upper face. Unclear etiology at that time, she was questioning allergic nature. She was seen in the emergency room did have improvement in her symptoms with antihistamines. She notes symptoms again worsened with swelling and pain. She was seen at primary care provider's office today, outpatient CT ordered which returned showing dental abscess. Patient denies any fever, she notes pain to the upper gumline and face, no pain in the eyes, painful ocular movements or purulent nasal discharge. She denies any extension down into the neck.  Past Medical History:  Diagnosis Date  . Asthma   . GERD (gastroesophageal reflux disease)   . Heart murmur   . Pneumonia 03/28/2016    Patient Active Problem List   Diagnosis Date Noted  . Syncope 12/22/2016  . Hyperthyroidism 12/22/2016  . Alkaline phosphatase elevation 11/08/2016  . Anemia 06/30/2016  . GERD (gastroesophageal reflux disease) 06/30/2016  . Elevated BP without diagnosis of hypertension 05/01/2016  . Solitary pulmonary nodule 04/10/2016  . Influenza with pneumonia 03/29/2016  . Nonspecific abnormal electrocardiogram (ECG) (EKG) 03/29/2016  . Polycythemia 02/23/2015  . Hyperglycemia 02/23/2015  . Asthma, chronic 05/04/2012  . Acute asthma exacerbation 03/31/2012  . Hypokalemia 03/31/2012    Past Surgical History:  Procedure Laterality Date  . CATARACT EXTRACTION W/ INTRAOCULAR LENS  IMPLANT, BILATERAL  2011 &2009  . TONSILLECTOMY    . TUBAL LIGATION       OB History   None      Home Medications    Prior to Admission  medications   Medication Sig Start Date End Date Taking? Authorizing Provider  acetaminophen (TYLENOL) 325 MG tablet Take 325-650 mg by mouth every 6 (six) hours as needed for mild pain or headache.    [provider]  albuterol (PROVENTIL HFA;VENTOLIN HFA) 108 (90 Base) MCG/ACT inhaler Inhale 2 puffs into the lungs every 6 (six) hours as needed for wheezing. 02/27/15   Thurnell Lose, MD  albuterol (PROVENTIL) (2.5 MG/3ML) 0.083% nebulizer solution Take 3 mLs (2.5 mg total) by nebulization every 6 (six) hours as needed. asthma 04/05/12   Bonnielee Haff, MD  amoxicillin-clavulanate (AUGMENTIN) 875-125 MG tablet Take 1 tablet by mouth 2 (two) times daily. 10/16/17   Nche, Charlene Brooke, NP  amoxicillin-clavulanate (AUGMENTIN) 875-125 MG tablet Take 1 tablet by mouth every 12 (twelve) hours. 10/18/17   Lunabella Badgett, Dellis Filbert, PA-C  budesonide-formoterol (SYMBICORT) 160-4.5 MCG/ACT inhaler Inhale 2 puffs into the lungs 2 (two) times daily. 01/15/17   Tanda Rockers, MD  famotidine (PEPCID) 20 MG tablet One at bedtime Patient taking differently: Take 20 mg by mouth at bedtime. One at bedtime 11/26/15   Tanda Rockers, MD  fluticasone [facility]) 50 MCG/ACT nasal spray Place 2 sprays into the nose daily. Patient taking differently: Place 2 sprays into both nostrils daily as needed for allergies.  11/28/11   Tanda Rockers, MD  HYDROcodone-acetaminophen (NORCO/VICODIN) 5-325 MG tablet Take 1 tablet by mouth every 6 (six) hours as needed. 10/18/17   Chabely Norby, Dellis Filbert, PA-C  methimazole (TAPAZOLE) 10 MG tablet 3 tablets twice daily 08/16/17   Dwyane Dee,  Vicenta Aly, MD  metoprolol succinate (TOPROL-XL) 25 MG 24 hr tablet Take 1 tablet (25 mg total) by mouth daily. 10/11/17   Elayne Snare, MD  montelukast (SINGULAIR) 10 MG tablet Take 1 tablet (10 mg total) by mouth at bedtime. 01/15/17   Tanda Rockers, MD  pantoprazole (PROTONIX) 40 MG tablet Take 1 tablet (40 mg total) by mouth daily before breakfast. 12/16/15   Tanda Rockers, MD  predniSONE (DELTASONE) 20 MG tablet Take 2 tablets (40 mg total) by mouth daily with breakfast for 4 days. 10/16/17 10/20/17  Nche, Charlene Brooke, NP    Family History Family History  Problem Relation Age of Onset  . Asthma Sister   . Emphysema Father        was a smoker  . Hypertension Mother   . Hypertension Sister   . Thyroid disease Neg Hx   . Diabetes Neg Hx     Social History Social History   Tobacco Use  . Smoking status: Never Smoker  . Smokeless tobacco: Never Used  Substance Use Topics  . Alcohol use: Yes    Comment: maybe 1-2 times annually  . Drug use: No     Allergies   Aspirin   Review of Systems Review of Systems  All other systems reviewed and are negative.    Physical Exam Updated Vital Signs BP (!) 156/69 (BP Location: Right Arm)   Pulse 94   Temp 98.4 F (36.9 C) (Oral)   Resp 16   SpO2 99%   Physical Exam  Constitutional: She is oriented to person, place, and time. She appears well-developed and well-nourished.  HENT:  Head: Normocephalic and atraumatic.  Large abscess to the left upper gumline with fluctuance, overlying cellulitis to the face with induration extending up the cheek, neck supple full active range of motion to tenderness or firmness to the floor the mouth  Eyes: Pupils are equal, round, and reactive to light. Conjunctivae are normal. Right eye exhibits no discharge. Left eye exhibits no discharge. No scleral icterus.  Neck: Normal range of motion. No JVD present. No tracheal deviation present.  Pulmonary/Chest: Effort normal. No stridor.  Neurological: She is alert and oriented to person, place, and time. Coordination normal.  Psychiatric: She has a normal mood and affect. Her behavior is normal. Judgment and thought content normal.  Nursing note and vitals reviewed.    ED Treatments / Results  Labs (all labs ordered are listed, but only abnormal results are displayed) Labs Reviewed  CBC WITH  DIFFERENTIAL/PLATELET - Abnormal; Notable for the following components:      Result Value   WBC 12.5 (*)    Hemoglobin 10.3 (*)    HCT 34.4 (*)    MCV 76.4 (*)    MCH 22.9 (*)    MCHC 29.9 (*)    RDW 15.9 (*)    Neutro Abs 9.1 (*)    Monocytes Absolute 1.1 (*)    All other components within normal limits  BASIC METABOLIC PANEL - Abnormal; Notable for the following components:   Glucose, Bld 106 (*)    All other components within normal limits    EKG None  Radiology Ct Maxillofacial Wo Contrast  Result Date: 10/18/2017 CLINICAL DATA:  Left facial swelling and pain. EXAM: CT MAXILLOFACIAL WITHOUT CONTRAST TECHNIQUE: Multidetector CT imaging of the maxillofacial structures was performed. Multiplanar CT image reconstructions were also generated. COMPARISON:  Head CT 12/22/2016 FINDINGS: Osseous: Scattered dental caries are noted. There is periapical lucency associated with  the left maxillary canine with disruption of the buccal cortex and an overlying 2.3 x 1.3 cm fluid collection with surrounding soft tissue inflammation. No acute fracture. Moderate cervical disc and facet degeneration. Orbits: Bilateral cataract extraction. Sinuses: Postsurgical changes in the maxillary sinuses. 2.5 cm right maxillary sinus polyp or mucous retention cyst. Mild left maxillary and bilateral ethmoid sinus mucosal thickening. Multiple small polypoid masses in the nasal cavity bilaterally. Clear mastoid air cells. Soft tissues: Soft tissue swelling/inflammation throughout the left face. Limited intracranial: Unremarkable. IMPRESSION: 2.3 cm fluid collection consistent with abscess anterior to the left maxilla associated with left maxillary canine periodontal disease. Electronically Signed   By: Logan Bores M.D.   On: 10/18/2017 12:40    Procedures Procedures (including critical care time)  Medications Ordered in ED Medications  amoxicillin-clavulanate (AUGMENTIN) 875-125 MG per tablet 1 tablet (has no  administration in time range)  HYDROcodone-acetaminophen (NORCO/VICODIN) 5-325 MG per tablet 1 tablet (1 tablet Oral Given 10/18/17 2000)     Initial Impression / Assessment and Plan / ED Course  I have reviewed the triage vital signs and the nursing notes.  Pertinent labs & imaging results that were available during my care of the patient were reviewed by me and considered in my medical decision making (see chart for details).     Labs: CBC, BMP  Imaging: CT Maxillofacial as an outpatient  Consults:  Therapeutics:  Discharge Meds: augmentin, hydrocodone   Assessment/Plan: 72 year old female presents today with dental abscess. She had a large abscess that was I&D successfully here. Patient is afebrile. Patient stable for outpatient management including Augmentin, close dental follow-up. Patient will return to the emergency room in 2 days if symptoms are not improving, she will return immediately if they worsen. Patient was given hydrocodone here she tolerated without difficulty. I will give her several pills for home as needed, she is counseled on the risks of taking these medications.She verbalized understanding and agreement to this plan and had no further questions or concerns at time of discharge.    Final Clinical Impressions(s) / ED Diagnoses   Final diagnoses:  Dental abscess    ED Discharge Orders         Ordered    amoxicillin-clavulanate (AUGMENTIN) 875-125 MG tablet  Every 12 hours     10/18/17 2124    HYDROcodone-acetaminophen (NORCO/VICODIN) 5-325 MG tablet  Every 6 hours PRN     10/18/17 2124           Okey Regal, PA-C 10/18/17 2151    Maudie Flakes, MD 10/18/17 2208

## 2017-10-19 ENCOUNTER — Other Ambulatory Visit: Payer: Self-pay | Admitting: Nurse Practitioner

## 2017-10-19 DIAGNOSIS — J0100 Acute maxillary sinusitis, unspecified: Secondary | ICD-10-CM

## 2017-10-19 DIAGNOSIS — R220 Localized swelling, mass and lump, head: Secondary | ICD-10-CM

## 2017-10-24 ENCOUNTER — Encounter: Payer: Self-pay | Admitting: Nurse Practitioner

## 2017-11-27 ENCOUNTER — Encounter (HOSPITAL_COMMUNITY)
Admission: RE | Admit: 2017-11-27 | Discharge: 2017-11-27 | Disposition: A | Discharge status: Home / Self Care | Payer: Medicare HMO | Source: Ambulatory Visit | Attending: Endocrinology | Admitting: Endocrinology

## 2017-11-27 ENCOUNTER — Ambulatory Visit (HOSPITAL_COMMUNITY)
Admission: RE | Admit: 2017-11-27 | Discharge: 2017-11-27 | Disposition: A | Discharge status: Home / Self Care | Payer: Medicare HMO | Source: Ambulatory Visit | Attending: Endocrinology | Admitting: Endocrinology

## 2017-11-27 DIAGNOSIS — E0520 Thyrotoxicosis with toxic multinodular goiter without thyrotoxic crisis or storm: Secondary | ICD-10-CM | POA: Diagnosis not present

## 2017-11-27 MED ORDER — SODIUM IODIDE I-123 7.4 MBQ CAPS
435.5000 | ORAL_CAPSULE | Freq: Once | ORAL | Status: AC
  Administered 2017-11-27: 435.5 via ORAL

## 2017-11-28 ENCOUNTER — Encounter (HOSPITAL_COMMUNITY)
Admission: RE | Admit: 2017-11-28 | Discharge: 2017-11-28 | Disposition: A | Discharge status: Home / Self Care | Payer: Medicare HMO | Source: Ambulatory Visit | Attending: Endocrinology | Admitting: Endocrinology

## 2017-11-28 DIAGNOSIS — E0520 Thyrotoxicosis with toxic multinodular goiter without thyrotoxic crisis or storm: Secondary | ICD-10-CM | POA: Diagnosis not present

## 2017-11-30 ENCOUNTER — Other Ambulatory Visit (INDEPENDENT_AMBULATORY_CARE_PROVIDER_SITE_OTHER): Payer: Medicare HMO

## 2017-11-30 DIAGNOSIS — E0520 Thyrotoxicosis with toxic multinodular goiter without thyrotoxic crisis or storm: Secondary | ICD-10-CM

## 2017-11-30 LAB — T4, FREE: FREE T4: 3.41 ng/dL — AB (ref 0.60–1.60)

## 2017-11-30 LAB — T3, FREE: T3 FREE: 10.8 pg/mL — AB (ref 2.3–4.2)

## 2017-12-04 ENCOUNTER — Ambulatory Visit (INDEPENDENT_AMBULATORY_CARE_PROVIDER_SITE_OTHER): Payer: Medicare HMO | Admitting: Endocrinology

## 2017-12-04 ENCOUNTER — Encounter: Payer: Self-pay | Admitting: Endocrinology

## 2017-12-04 VITALS — BP 142/80 | HR 88 | Ht 69.0 in | Wt 167.0 lb

## 2017-12-04 DIAGNOSIS — E0520 Thyrotoxicosis with toxic multinodular goiter without thyrotoxic crisis or storm: Secondary | ICD-10-CM | POA: Diagnosis not present

## 2017-12-04 NOTE — Progress Notes (Signed)
Patient ID: [name], female   DOB: [birth year], 72 y.o.   MRN: [id number]                                                                                                                 Reason for Appointment:  Hyperthyroidism, follow-up    Chief complaint: Follow-up of thyroid   History of Present Illness:   History obtained on patient's initial visit: Patient had started losing weight in August 2017 and apparently had lost a total of about 30 pounds last year despite an increased appetite She did not discuss this with any physician Over the last couple of months also she has noticed that she is feeling excessively warm and sometimes sweaty.  Also has had tiredness. She does not think she has had significant symptoms of palpitations, shakiness, nervousness or change in bowel habits In early February she was admitted to the hospital for shortness of breath and worsening of her asthma She had a significantly high pulse rate and was found to be hyperthyroid She was started on methimazole 20 mg twice a day on her discharge on 03/31/16  Recent history: Also she was supposed to have her I-131 treatment in 5/18 she has either canceled or no showed for her treatments She finally had her I-131 treatment 06/15/2017 with 28.9 mCi  She did have improvement in her thyroid levels and free T4 was back to normal in July and was told to stop her methimazole  However she has had progressively increasing thyroid function subsequently Even with taking methimazole her thyroid levels continue to be high, she has been usually taking 30 mg twice daily of methimazole Despite her high thyroid levels she continues to think that she does not have any heat intolerance or palpitations She does tend to feel shaky at times  Her thyroid scan previously showed hot and cold nodules and uptake of 54% of the first measurement and follow-up measurement in 12/18 was 76.8% However most recently her 24 uptake is only  13.4%, 4-hour uptake 32% Although she was needing to stay off methimazole for the treatment she has gone back on it last Thursday  Wt Readings from Last 3 Encounters:  12/04/17 167 lb (75.8 kg)  10/15/17 160 lb (72.6 kg)  10/12/17 167 lb 15.9 oz (76.2 kg)       Thyroid function tests as follows:     Lab Results  Component Value Date   FREET4 3.41 (H) 11/30/2017   FREET4 2.51 (H) 10/08/2017   FREET4 1.60 08/22/2017   FREET4 3.11 (H) 07/03/2017   T3FREE 10.8 (H) 11/30/2017   T3FREE 7.0 (H) 10/08/2017   T3FREE 5.0 (H) 08/22/2017   T3FREE 9.9 (H) 07/03/2017    Lab Results  Component Value Date   TSH <0.01 (L) 10/08/2017     No results found for: THYROTRECAB     Allergies as of 12/04/2017      Reactions   Aspirin Shortness Of Breath      Medication List  Accurate as of 12/04/17  3:41 PM. Always use your most recent med list.          acetaminophen 325 MG tablet Commonly known as:  TYLENOL Take 325-650 mg by mouth every 6 (six) hours as needed for mild pain or headache.   albuterol (2.5 MG/3ML) 0.083% nebulizer solution Commonly known as:  PROVENTIL Take 3 mLs (2.5 mg total) by nebulization every 6 (six) hours as needed. asthma   albuterol 108 (90 Base) MCG/ACT inhaler Commonly known as:  PROVENTIL HFA;VENTOLIN HFA Inhale 2 puffs into the lungs every 6 (six) hours as needed for wheezing.   budesonide-formoterol 160-4.5 MCG/ACT inhaler Commonly known as:  SYMBICORT Inhale 2 puffs into the lungs 2 (two) times daily.   famotidine 20 MG tablet Commonly known as:  PEPCID One at bedtime   fluticasone 50 MCG/ACT nasal spray Commonly known as:  FLONASE Place 2 sprays into the nose daily.   HYDROcodone-acetaminophen 5-325 MG tablet Commonly known as:  NORCO/VICODIN Take 1 tablet by mouth every 6 (six) hours as needed.   methimazole 10 MG tablet Commonly known as:  TAPAZOLE 3 tablets twice daily   metoprolol succinate 25 MG 24 hr tablet Commonly  known as:  TOPROL-XL Take 1 tablet (25 mg total) by mouth daily.   montelukast 10 MG tablet Commonly known as:  SINGULAIR Take 1 tablet (10 mg total) by mouth at bedtime.   pantoprazole 40 MG tablet Commonly known as:  PROTONIX Take 1 tablet (40 mg total) by mouth daily before breakfast.           Past Medical History:  Diagnosis Date  . Asthma   . GERD (gastroesophageal reflux disease)   . Heart murmur   . Pneumonia 03/28/2016    Past Surgical History:  Procedure Laterality Date  . CATARACT EXTRACTION W/ INTRAOCULAR LENS  IMPLANT, BILATERAL  2011 &2009  . TONSILLECTOMY    . TUBAL LIGATION      Family History  Problem Relation Age of Onset  . Asthma Sister   . Emphysema Father        was a smoker  . Hypertension Mother   . Hypertension Sister   . Thyroid disease Neg Hx   . Diabetes Neg Hx     Social History:  reports that she has never smoked. She has never used smokeless tobacco. She reports that she drinks alcohol. She reports that she does not use drugs.  Allergies:  Allergies  Allergen Reactions  . 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   She has had her radioactive iodine treatment in 05/2017 with only some improvement and subsequently has had a relapse Her thyroid levels are progressively higher now She is however not very symptomatic  Her uptake now is only 13.4% but  this may  be related to only small areas of her thyroid being overactive Since she has difficulty with controlling her hyperthyroidism with methimazole she needs to have another treatment  Currently her thyroid enlargement is about the same as before  We will have her scheduled for 30 mCi and follow-up in about 6 weeks She needs to stop her methimazole a week before the reschedule treatment and restart 3 days later  Total visit time 15 minutes  There are no Patient Instructions on file for this visit.    Elayne Snare 12/04/2017, 3:41 PM

## 2017-12-14 ENCOUNTER — Encounter (HOSPITAL_COMMUNITY)
Admission: RE | Admit: 2017-12-14 | Discharge: 2017-12-14 | Disposition: A | Discharge status: Home / Self Care | Payer: Medicare HMO | Source: Ambulatory Visit | Attending: Endocrinology | Admitting: Endocrinology

## 2017-12-14 DIAGNOSIS — E0520 Thyrotoxicosis with toxic multinodular goiter without thyrotoxic crisis or storm: Secondary | ICD-10-CM | POA: Diagnosis not present

## 2017-12-14 DIAGNOSIS — E0590 Thyrotoxicosis, unspecified without thyrotoxic crisis or storm: Secondary | ICD-10-CM | POA: Diagnosis not present

## 2017-12-14 MED ORDER — SODIUM IODIDE I 131 CAPSULE
28.7000 | Freq: Once | INTRAVENOUS | Status: AC | PRN
  Administered 2017-12-14: 28.7 via ORAL

## 2018-01-22 ENCOUNTER — Other Ambulatory Visit (INDEPENDENT_AMBULATORY_CARE_PROVIDER_SITE_OTHER): Payer: Medicare HMO

## 2018-01-22 DIAGNOSIS — E0520 Thyrotoxicosis with toxic multinodular goiter without thyrotoxic crisis or storm: Secondary | ICD-10-CM

## 2018-01-22 LAB — T4, FREE: FREE T4: 0.92 ng/dL (ref 0.60–1.60)

## 2018-01-22 LAB — T3, FREE: T3, Free: 4 pg/mL (ref 2.3–4.2)

## 2018-01-22 LAB — TSH: TSH: <0.01 u[IU]/mL — ABNORMAL LOW (ref 0.35–4.50)

## 2018-01-24 ENCOUNTER — Ambulatory Visit: Payer: Medicare HMO | Admitting: Endocrinology

## 2018-01-24 ENCOUNTER — Encounter: Payer: Self-pay | Admitting: Endocrinology

## 2018-01-24 VITALS — BP 122/68 | HR 61 | Ht 69.0 in | Wt 164.0 lb

## 2018-01-24 DIAGNOSIS — E0520 Thyrotoxicosis with toxic multinodular goiter without thyrotoxic crisis or storm: Secondary | ICD-10-CM | POA: Diagnosis not present

## 2018-01-24 NOTE — Progress Notes (Signed)
Patient ID: [name], female   DOB: 04-28-1945, 72 y.o.   MRN: [id number]                                                                                                                 Reason for Appointment:  Hyperthyroidism, follow-up    Chief complaint: Follow-up of thyroid   History of Present Illness:   History obtained on patient's initial visit: Patient had started losing weight in August 2017 and apparently had lost a total of about 30 pounds last year despite an increased appetite She did not discuss this with any physician Over the last couple of months also she has noticed that she is feeling excessively warm and sometimes sweaty.  Also has had tiredness. She does not think she has had significant symptoms of palpitations, shakiness, nervousness or change in bowel habits In early February she was admitted to the hospital for shortness of breath and worsening of her asthma She had a significantly high pulse rate and was found to be hyperthyroid She was started on methimazole 20 mg twice a day on her discharge on 03/31/16  Recent history:  She had her first I-131 treatment 06/15/2017 with 28.9 mCi  She did have improvement in her thyroid levels and free T4 was back to normal in July and was told to stop her methimazole  However she had progressively increasing thyroid function subsequently  She had her second I-131 treatment with 28.7 mCi on 12/04/2017  She is continuing to take methimazole, 3 tablets twice a day She thinks she feels better with her energy level and is not as hot as before No palpitations or shakiness now Her weight has been fluctuating and slightly lower now  Her thyroid scan previously showed hot and cold nodules and uptake of 54% of the first measurement and follow-up measurement in 12/18 was 76.8% Prior to second I-131 treatment the 24 uptake is only 13.4%, 4-hour uptake 32%  Thyroid levels are back to normal and previously had been persistently  high or high  normal  Wt Readings from Last 3 Encounters:  01/24/18 164 lb (74.4 kg)  12/04/17 167 lb (75.8 kg)  10/15/17 160 lb (72.6 kg)       Thyroid function tests as follows:     Lab Results  Component Value Date   FREET4 0.92 01/22/2018   FREET4 3.41 (H) 11/30/2017   FREET4 2.51 (H) 10/08/2017   FREET4 1.60 08/22/2017   T3FREE 4.0 01/22/2018   T3FREE 10.8 (H) 11/30/2017   T3FREE 7.0 (H) 10/08/2017   T3FREE 5.0 (H) 08/22/2017    Lab Results  Component Value Date   TSH <0.01 (L) 01/22/2018     No results found for: THYROTRECAB     Allergies as of 01/24/2018      Reactions   Aspirin Shortness Of Breath      Medication List        Accurate as of 01/24/18  9:49 AM. Always use your most recent med list.  acetaminophen 325 MG tablet Commonly known as:  TYLENOL Take 325-650 mg by mouth every 6 (six) hours as needed for mild pain or headache.   albuterol (2.5 MG/3ML) 0.083% nebulizer solution Commonly known as:  PROVENTIL Take 3 mLs (2.5 mg total) by nebulization every 6 (six) hours as needed. asthma   albuterol 108 (90 Base) MCG/ACT inhaler Commonly known as:  PROVENTIL HFA;VENTOLIN HFA Inhale 2 puffs into the lungs every 6 (six) hours as needed for wheezing.   budesonide-formoterol 160-4.5 MCG/ACT inhaler Commonly known as:  SYMBICORT Inhale 2 puffs into the lungs 2 (two) times daily.   famotidine 20 MG tablet Commonly known as:  PEPCID One at bedtime   fluticasone 50 MCG/ACT nasal spray Commonly known as:  FLONASE Place 2 sprays into the nose daily.   HYDROcodone-acetaminophen 5-325 MG tablet Commonly known as:  NORCO/VICODIN Take 1 tablet by mouth every 6 (six) hours as needed.   methimazole 10 MG tablet Commonly known as:  TAPAZOLE 3 tablets twice daily   metoprolol succinate 25 MG 24 hr tablet Commonly known as:  TOPROL-XL Take 1 tablet (25 mg total) by mouth daily.   montelukast 10 MG tablet Commonly known as:   SINGULAIR Take 1 tablet (10 mg total) by mouth at bedtime.   pantoprazole 40 MG tablet Commonly known as:  PROTONIX Take 1 tablet (40 mg total) by mouth daily before breakfast.           Past Medical History:  Diagnosis Date  . Asthma   . GERD (gastroesophageal reflux disease)   . Heart murmur   . Pneumonia 03/28/2016    Past Surgical History:  Procedure Laterality Date  . CATARACT EXTRACTION W/ INTRAOCULAR LENS  IMPLANT, BILATERAL  2011 &2009  . TONSILLECTOMY    . TUBAL LIGATION      Family History  Problem Relation Age of Onset  . Asthma Sister   . Emphysema Father        was a smoker  . Hypertension Mother   . Hypertension Sister   . Thyroid disease Neg Hx   . Diabetes Neg Hx     Social History:  reports that she has never smoked. She has never used smokeless tobacco. She reports that she drinks alcohol. She reports that she does not use drugs.  Allergies:  Allergies  Allergen Reactions  . Aspirin Shortness Of Breath      Review of Systems  Currently on metoprolol 25 mg   Examination:   BP 122/68 (BP Location: Left Arm, Patient Position: Sitting, Cuff Size: Normal)   Pulse 61   Ht 5\' 9"  (1.753 m)   Wt 164 lb (74.4 kg)   SpO2 97%   BMI 24.22 kg/m          .Right lobe is enlarged about 2.5-3 times normal and lateral part is under the sternomastoid, smooth and firm  The isthmus is also palpable with a 2  cm nodule  Left lobe is very firm, nodular, measuring about 2.5 cm  No lymphadenopathy  Neurological: Deep tendon reflexes at biceps are normal Has no tremor  Skin is not unusually warm    Assessment/Plan:   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   She has had her radioactive iodine treatment a second time in 11/2017  She is still taking methimazole since her I-131 treatment nearly 2 months ago She is subjectively doing well Her thyroid levels are significantly better and now quite normal, previously unable to achieve good thyroid  levels including  T3 levels Her goiter appears to be slightly smaller also   Although it is unclear whether her hyperthyroidism has completely resolved her levels are better than before and may be able to stay euthyroid without methimazole now She will stop methimazole and follow-up in 6 weeks She will call if she has any new symptoms Also discussed possibility of hypothyroidism over time if she does not have adequate thyroid reserve  Total visit time 15 minutes  There are no Patient Instructions on file for this visit.    Elayne Snare 01/24/2018, 9:49 AM

## 2018-01-24 NOTE — Patient Instructions (Signed)
Stop Methimazole 

## 2018-03-12 ENCOUNTER — Other Ambulatory Visit (INDEPENDENT_AMBULATORY_CARE_PROVIDER_SITE_OTHER): Payer: Medicare Other

## 2018-03-12 DIAGNOSIS — E0520 Thyrotoxicosis with toxic multinodular goiter without thyrotoxic crisis or storm: Secondary | ICD-10-CM | POA: Diagnosis not present

## 2018-03-12 LAB — T3, FREE: T3, Free: 2.7 pg/mL (ref 2.3–4.2)

## 2018-03-12 LAB — T4, FREE: Free T4: 0.67 ng/dL (ref 0.60–1.60)

## 2018-03-12 LAB — TSH: TSH: <0.01 u[IU]/mL — ABNORMAL LOW (ref 0.35–4.50)

## 2018-03-14 ENCOUNTER — Encounter: Payer: Self-pay | Admitting: Endocrinology

## 2018-03-14 ENCOUNTER — Ambulatory Visit: Payer: Medicare Other | Admitting: Endocrinology

## 2018-03-14 VITALS — BP 142/70 | HR 66 | Ht 69.0 in | Wt 180.0 lb

## 2018-03-14 DIAGNOSIS — E042 Nontoxic multinodular goiter: Secondary | ICD-10-CM

## 2018-03-14 MED ORDER — LEVOTHYROXINE SODIUM 25 MCG PO TABS: 25.0000 ug | ORAL_TABLET | Freq: Every day | ORAL | 3 refills | Status: DC

## 2018-03-14 NOTE — Progress Notes (Signed)
Patient ID: [name], female   DOB: 1945-10-09, 73 y.o.   MRN: [id number]                                                                                                                 Reason for Appointment:  Hyperthyroidism, follow-up    Chief complaint: Follow-up of thyroid   History of Present Illness:   History obtained on patient's initial visit: Patient had started losing weight in August 2017 and apparently had lost a total of about 30 pounds last year despite an increased appetite She did not discuss this with any physician Over the last couple of months also she has noticed that she is feeling excessively warm and sometimes sweaty.  Also has had tiredness. She does not think she has had significant symptoms of palpitations, shakiness, nervousness or change in bowel habits In early February she was admitted to the hospital for shortness of breath and worsening of her asthma She had a significantly high pulse rate and was found to be hyperthyroid She was started on methimazole 20 mg twice a day on her discharge on 03/31/16  Recent history:  She had her first I-131 treatment 06/15/2017 with 28.9 mCi  She did have improvement in her thyroid levels and free T4 was back to normal in July and was told to stop her methimazole However she had progressively increasing thyroid function subsequently  She had her second I-131 treatment with 28.7 mCi on 12/04/2017 Methimazole was subsequently stopped  In the last few weeks she feels very normal with her energy level and has no shakiness, heat or cold intolerance or skin or hair changes Her weight has however gone up 16 pounds which is unusual She says her normal weight is about 175 pounds  Her thyroid scan previously showed hot and cold nodules and uptake of 54% of the first measurement and follow-up measurement in 12/18 was 76.8% Prior to second I-131 treatment the 24 uptake is only 13.4%, 4-hour uptake 32%  Thyroid levels are  back to normal and previously had been persistently high or high  normal  Wt Readings from Last 3 Encounters:  03/14/18 180 lb (81.6 kg)  01/24/18 164 lb (74.4 kg)  12/04/17 167 lb (75.8 kg)       Thyroid function tests as follows:     Lab Results  Component Value Date   FREET4 0.67 03/12/2018   FREET4 0.92 01/22/2018   FREET4 3.41 (H) 11/30/2017   FREET4 2.51 (H) 10/08/2017   T3FREE 2.7 03/12/2018   T3FREE 4.0 01/22/2018   T3FREE 10.8 (H) 11/30/2017   T3FREE 7.0 (H) 10/08/2017    Lab Results  Component Value Date   TSH <0.01 (L) 03/12/2018     No results found for: THYROTRECAB     Allergies as of 03/14/2018      Reactions   Aspirin Shortness Of Breath      Medication List       Accurate as of March 14, 2018  8:50 AM. Always use  your most recent med list.        acetaminophen 325 MG tablet Commonly known as:  TYLENOL Take 325-650 mg by mouth every 6 (six) hours as needed for mild pain or headache.   albuterol (2.5 MG/3ML) 0.083% nebulizer solution Commonly known as:  PROVENTIL Take 3 mLs (2.5 mg total) by nebulization every 6 (six) hours as needed. asthma   albuterol 108 (90 Base) MCG/ACT inhaler Commonly known as:  PROVENTIL HFA;VENTOLIN HFA Inhale 2 puffs into the lungs every 6 (six) hours as needed for wheezing.   budesonide-formoterol 160-4.5 MCG/ACT inhaler Commonly known as:  SYMBICORT Inhale 2 puffs into the lungs 2 (two) times daily.   famotidine 20 MG tablet Commonly known as:  PEPCID One at bedtime   fluticasone 50 MCG/ACT nasal spray Commonly known as:  FLONASE Place 2 sprays into the nose daily.   montelukast 10 MG tablet Commonly known as:  SINGULAIR Take 1 tablet (10 mg total) by mouth at bedtime.   pantoprazole 40 MG tablet Commonly known as:  PROTONIX Take 1 tablet (40 mg total) by mouth daily before breakfast.           Past Medical History:  Diagnosis Date  . Asthma   . GERD (gastroesophageal reflux disease)   .  Heart murmur   . Pneumonia 03/28/2016    Past Surgical History:  Procedure Laterality Date  . CATARACT EXTRACTION W/ INTRAOCULAR LENS  IMPLANT, BILATERAL  2011 &2009  . TONSILLECTOMY    . TUBAL LIGATION      Family History  Problem Relation Age of Onset  . Asthma Sister   . Emphysema Father        was a smoker  . Hypertension Mother   . Hypertension Sister   . Thyroid disease Neg Hx   . Diabetes Neg Hx     Social History:  reports that she has never smoked. She has never used smokeless tobacco. She reports current alcohol use. She reports that she does not use drugs.  Allergies:  Allergies  Allergen Reactions  . Aspirin Shortness Of Breath      Review of Systems  No recent difficulty breathing    Examination:   BP (!) 142/70 (BP Location: Left Arm, Patient Position: Sitting, Cuff Size: Normal)   Pulse 66   Ht 5\' 9"  (1.753 m)   Wt 180 lb (81.6 kg)   SpO2 96%   BMI 26.58 kg/m         She looks well No swelling of the face or eyes  .Right lobe is enlarged about 2.5-3 times normal, nodular and firm   Left lobe is very firm, nodular, but is normal  Neurological: Deep tendon reflexes at biceps are slightly brisk Has no tremor  Skin appears normal and not unusually cool    Assessment/Plan:  History of hypothyroidism from toxic nodular goiter   She has had her radioactive iodine treatment a second time in 11/2017  Now even with stopping methimazole her thyroid levels have gone down to the low normal range TSH is still suppressed as expected Her goiter is about the same as on the last visit still showing nodularity  She is feeling very well and she thinks she is feeling quite normal with her energy level, no symptoms of cold intolerance However her weight gain is unusual  Her free T4 and T3 are low normal she may be getting mildly hypothyroid We will give her an empiric trial of levothyroxine 25 mcg daily to  take before breakfast She will follow-up in  2 months for retesting  Total visit time 15 minutes  There are no Patient Instructions on file for this visit.    Elayne Snare 03/14/2018, 8:50 AM

## 2018-05-10 ENCOUNTER — Other Ambulatory Visit (INDEPENDENT_AMBULATORY_CARE_PROVIDER_SITE_OTHER): Payer: Medicare Other

## 2018-05-10 ENCOUNTER — Other Ambulatory Visit: Payer: Self-pay

## 2018-05-10 DIAGNOSIS — E042 Nontoxic multinodular goiter: Secondary | ICD-10-CM | POA: Diagnosis not present

## 2018-05-10 LAB — TSH

## 2018-05-10 LAB — T4, FREE: FREE T4: 0.59 ng/dL — AB (ref 0.60–1.60)

## 2018-05-10 LAB — T3, FREE: T3, Free: 3.1 pg/mL (ref 2.3–4.2)

## 2018-05-14 ENCOUNTER — Encounter: Payer: Self-pay | Admitting: Endocrinology

## 2018-05-14 ENCOUNTER — Other Ambulatory Visit: Payer: Self-pay

## 2018-05-14 ENCOUNTER — Ambulatory Visit: Payer: Medicare Other | Admitting: Endocrinology

## 2018-05-14 VITALS — BP 128/70 | HR 78 | Temp 98.3°F | Ht 69.0 in | Wt 185.0 lb

## 2018-05-14 DIAGNOSIS — E890 Postprocedural hypothyroidism: Secondary | ICD-10-CM | POA: Diagnosis not present

## 2018-05-14 DIAGNOSIS — E042 Nontoxic multinodular goiter: Secondary | ICD-10-CM | POA: Diagnosis not present

## 2018-05-14 MED ORDER — LEVOTHYROXINE SODIUM 50 MCG PO TABS: 50.0000 ug | ORAL_TABLET | Freq: Every day | ORAL | 3 refills | Status: DC

## 2018-05-14 NOTE — Progress Notes (Signed)
Patient ID: [name], female   DOB: Feb 19, 1946, 73 y.o.   MRN: [id number]                                                                                                                 Reason for Appointment:  Hyperthyroidism, follow-up    Chief complaint: Follow-up of thyroid   History of Present Illness:   History obtained on patient's initial visit: Patient had started losing weight in August 2017 and apparently had lost a total of about 30 pounds last year despite an increased appetite She did not discuss this with any physician Over the last couple of months also she has noticed that she is feeling excessively warm and sometimes sweaty.  Also has had tiredness. She does not think she has had significant symptoms of palpitations, shakiness, nervousness or change in bowel habits In early February she was admitted to the hospital for shortness of breath and worsening of her asthma She had a significantly high pulse rate and was found to be hyperthyroid She was started on methimazole 20 mg twice a day on her discharge on 03/31/16  Recent history:  She had her first I-131 treatment 06/15/2017 with 28.9 mCi  She did have improvement in her thyroid levels and free T4 was back to normal in July and was told to stop her methimazole However she had progressively increasing thyroid function subsequently  She had her second I-131 treatment with 28.7 mCi on 12/04/2017 Methimazole was subsequently stopped  On her follow-up in 1/20 although she was not complaining of any fatigue her free T4 was low normal She is now on levothyroxine 25 mcg daily She takes this before breakfast daily However does not think she feels any different with taking this She says she is feeling fairly good with her energy level and no heat or cold intolerance Gaining weight back recently  Her free T4 is however lower than before at 0.59  Wt Readings from Last 3 Encounters:  05/14/18 185 lb (83.9 kg)   03/14/18 180 lb (81.6 kg)  01/24/18 164 lb (74.4 kg)       Thyroid function tests as follows:     Lab Results  Component Value Date   FREET4 0.59 (L) 05/10/2018   FREET4 0.67 03/12/2018   FREET4 0.92 01/22/2018   FREET4 3.41 (H) 11/30/2017   T3FREE 3.1 05/10/2018   T3FREE 2.7 03/12/2018   T3FREE 4.0 01/22/2018   T3FREE 10.8 (H) 11/30/2017   Her thyroid scan previously showed hot and cold nodules and uptake of 54% of the first measurement and follow-up measurement in 12/18 was 76.8% Prior to second I-131 treatment the 24 uptake is only 13.4%, 4-hour uptake 32%  Lab Results  Component Value Date   TSH <0.01 (L) 05/10/2018     No results found for: THYROTRECAB     Allergies as of 05/14/2018      Reactions   Aspirin Shortness Of Breath      Medication List  Accurate as of May 14, 2018  3:17 PM. Always use your most recent med list.        acetaminophen 325 MG tablet Commonly known as:  TYLENOL Take 325-650 mg by mouth every 6 (six) hours as needed for mild pain or headache.   albuterol (2.5 MG/3ML) 0.083% nebulizer solution Commonly known as:  PROVENTIL Take 3 mLs (2.5 mg total) by nebulization every 6 (six) hours as needed. asthma   albuterol 108 (90 Base) MCG/ACT inhaler Commonly known as:  PROVENTIL HFA;VENTOLIN HFA Inhale 2 puffs into the lungs every 6 (six) hours as needed for wheezing.   budesonide-formoterol 160-4.5 MCG/ACT inhaler Commonly known as:  SYMBICORT Inhale 2 puffs into the lungs 2 (two) times daily.   famotidine 20 MG tablet Commonly known as:  Pepcid One at bedtime   fluticasone 50 MCG/ACT nasal spray Commonly known as:  FLONASE Place 2 sprays into the nose daily.   levothyroxine 25 MCG tablet Commonly known as:  Synthroid Take 1 tablet (25 mcg total) by mouth daily before breakfast.   montelukast 10 MG tablet Commonly known as:  Singulair Take 1 tablet (10 mg total) by mouth at bedtime.   pantoprazole 40 MG tablet  Commonly known as:  PROTONIX Take 1 tablet (40 mg total) by mouth daily before breakfast.           Past Medical History:  Diagnosis Date  . Asthma   . GERD (gastroesophageal reflux disease)   . Heart murmur   . Pneumonia 03/28/2016    Past Surgical History:  Procedure Laterality Date  . CATARACT EXTRACTION W/ INTRAOCULAR LENS  IMPLANT, BILATERAL  2011 &2009  . TONSILLECTOMY    . TUBAL LIGATION      Family History  Problem Relation Age of Onset  . Asthma Sister   . Emphysema Father        was a smoker  . Hypertension Mother   . Hypertension Sister   . Thyroid disease Neg Hx   . Diabetes Neg Hx     Social History:  reports that she has never smoked. She has never used smokeless tobacco. She reports current alcohol use. She reports that she does not use drugs.  Allergies:  Allergies  Allergen Reactions  . 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   Although she is asymptomatic her free T4 is now below normal even with empirically starting supplement of 25 mcg levothyroxine in January TSH is still suppressed as expected She is subjectively still doing well She still has significant right-sided nodular goiter but this is smaller than originally prior to her I-131 treatments  We will have her increase her levothyroxine to 50 mcg, likely will need long-term supplementation and this was discussed Discussed timing of taking her levothyroxine in the mornings    She will follow-up in 2  months for follow-up   Total visit time 15 minutes  There are no Patient Instructions on file for this visit.    Elayne Snare 05/14/2018, 3:17 PM

## 2018-05-14 NOTE — Patient Instructions (Signed)
Take 2 of 25 dose

## 2018-06-13 IMAGING — DX DG CHEST 2V
2 series · 2 of 2 positions shown · non-contrast
Comparison: December 25, 2015

CLINICAL DATA: Shortness of breath and fever

EXAM:
CHEST  2 VIEW

[chest pa]
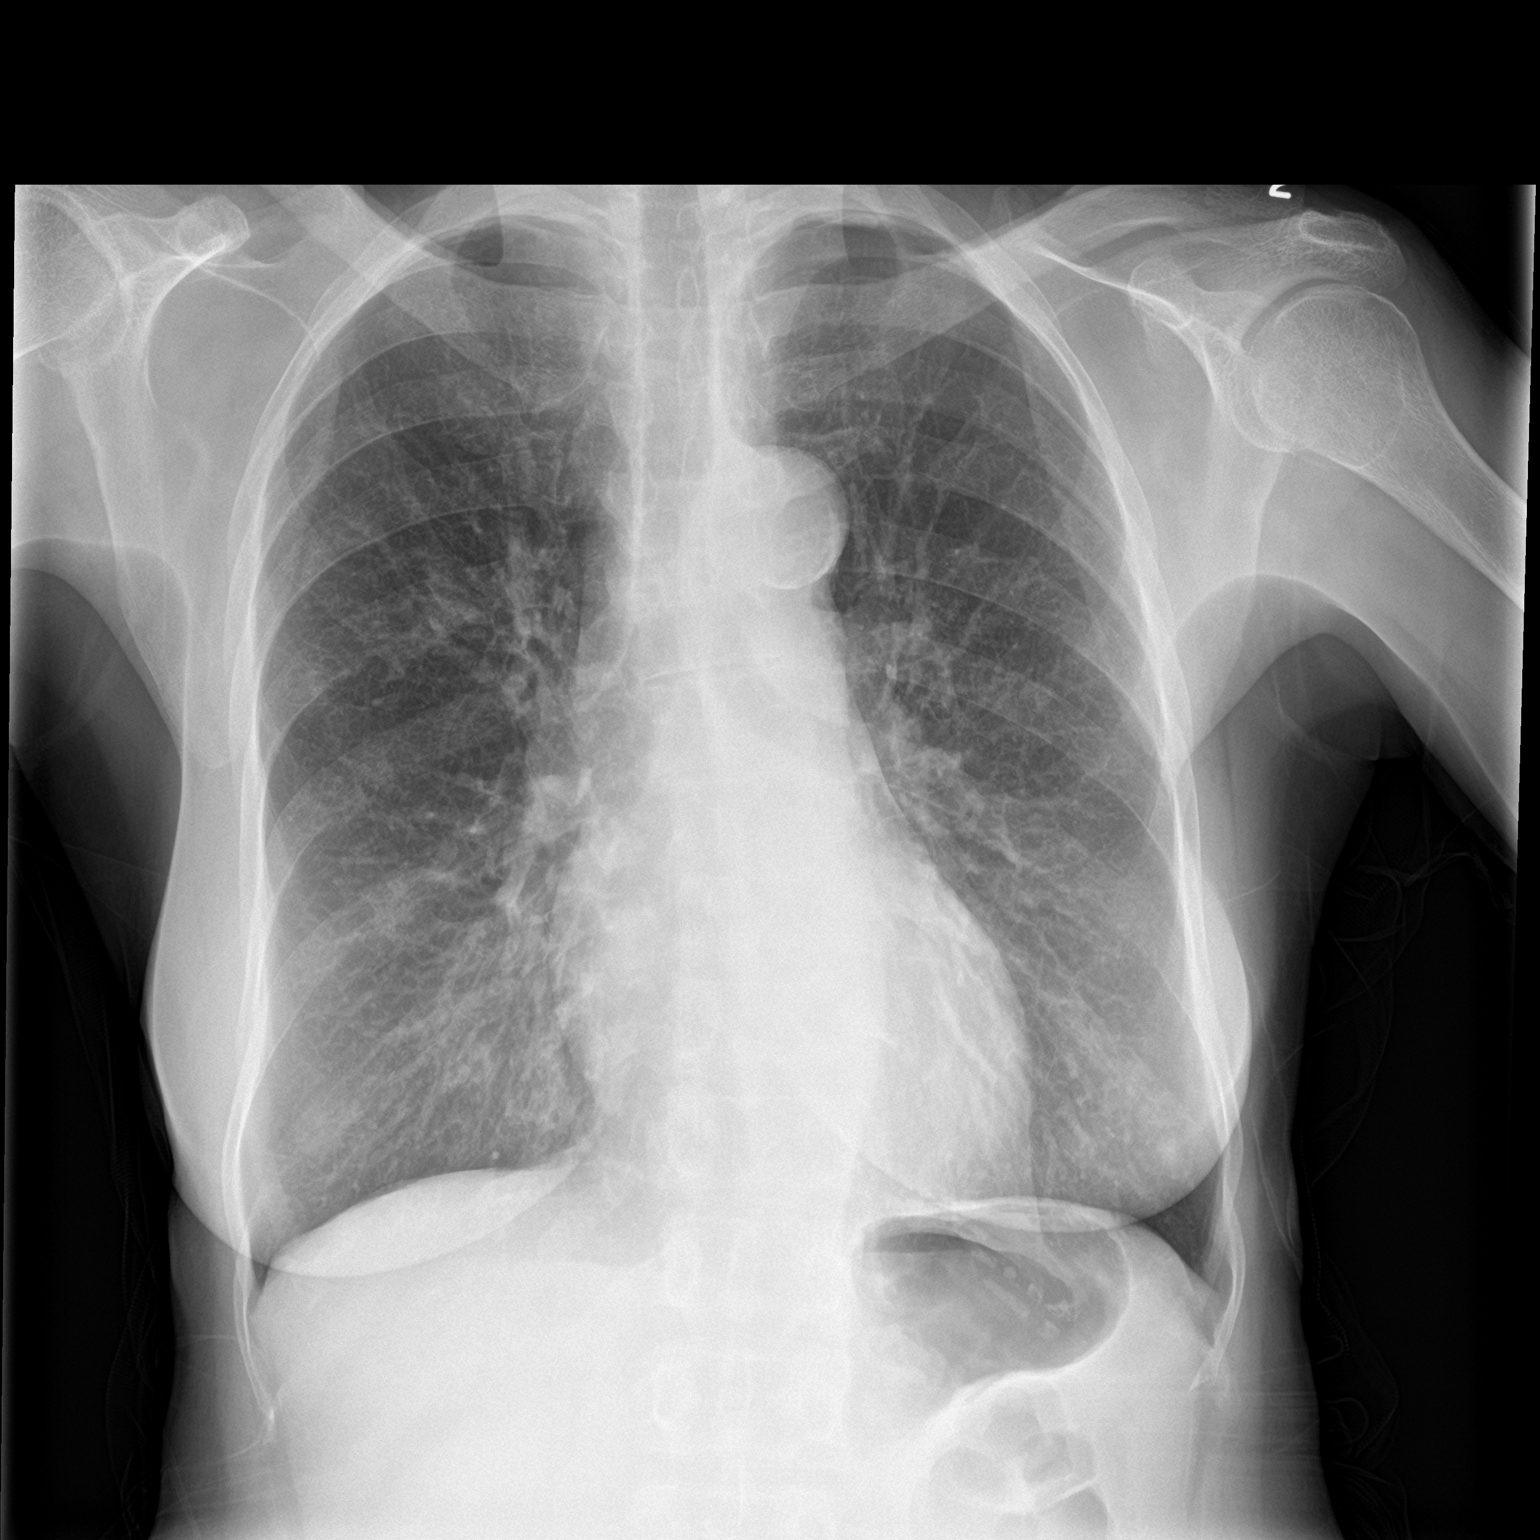

[chest lat]
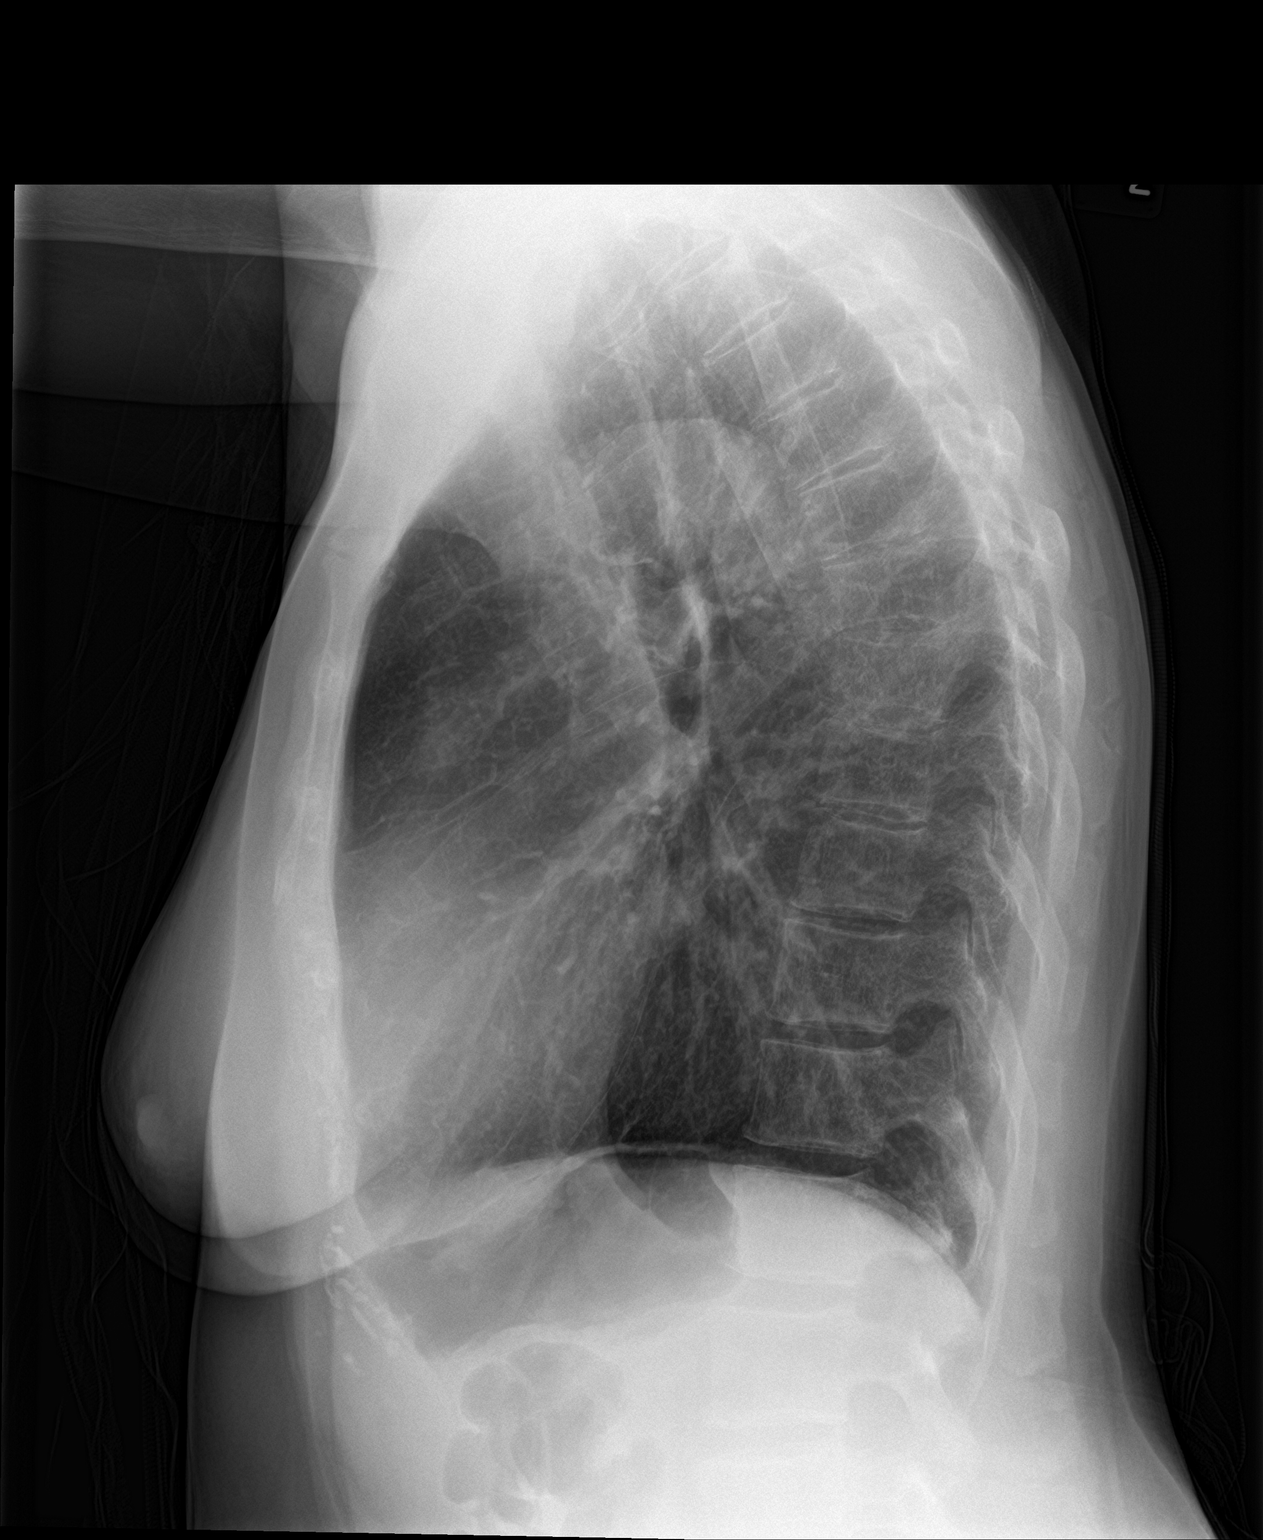

[2 of 2 positions shown; findings below may reference images not displayed]

FINDINGS: There are apparent nipple shadows bilaterally. There is rather
subtle patchy infiltrate in the right upper lobe. A smaller focus of
increased opacity is seen in the right middle lobe superiorly. Lungs
elsewhere are clear. Heart size and pulmonary vascularity are
normal. No adenopathy. There is atherosclerotic calcification in the
aorta. No adenopathy. No bone lesions.
IMPRESSION: Areas of patchy infiltrate on the right consistent with pneumonia.
Lungs elsewhere clear. Aortic atherosclerosis.

Followup PA and lateral chest radiographs recommended in 3-4 weeks
following trial of antibiotic therapy to ensure resolution and
exclude underlying malignancy. Note that the area of opacity in
superior right middle lobe appears subtly nodular. Short interval
follow-up advised in particular given this finding.

## 2018-07-12 ENCOUNTER — Other Ambulatory Visit (INDEPENDENT_AMBULATORY_CARE_PROVIDER_SITE_OTHER): Payer: Medicare Other

## 2018-07-12 ENCOUNTER — Other Ambulatory Visit: Payer: Self-pay

## 2018-07-12 DIAGNOSIS — E890 Postprocedural hypothyroidism: Secondary | ICD-10-CM | POA: Diagnosis not present

## 2018-07-12 NOTE — Addendum Note (Signed)
Addended by: Kaylyn Lim I on: 07/12/2018 03:26 PM   Modules accepted: Orders

## 2018-07-13 LAB — T4, FREE: Free T4: 1.32 ng/dL (ref 0.82–1.77)

## 2018-07-13 LAB — TSH: TSH: 0.016 u[IU]/mL — ABNORMAL LOW (ref 0.450–4.500)

## 2018-07-18 ENCOUNTER — Encounter: Payer: Self-pay | Admitting: Endocrinology

## 2018-07-18 ENCOUNTER — Other Ambulatory Visit: Payer: Self-pay

## 2018-07-18 ENCOUNTER — Ambulatory Visit (INDEPENDENT_AMBULATORY_CARE_PROVIDER_SITE_OTHER): Payer: Medicare Other | Admitting: Endocrinology

## 2018-07-18 DIAGNOSIS — E042 Nontoxic multinodular goiter: Secondary | ICD-10-CM

## 2018-07-18 NOTE — Progress Notes (Signed)
Patient ID: [name], female   DOB: 1945/12/12, 73 y.o.   MRN: [id number]                                                                                                                 Reason for Appointment:  Hyperthyroidism, follow-up   Today's office visit was provided via telemedicine using audio technique Explained to the patient and the the limitations of evaluation and management by telemedicine and the availability of in person appointments.  The patient understood the limitations and agreed to proceed. Patient also understood that the telehealth visit is billable. . Location of the patient: Home . Location of the provider: Office Only the patient and myself were participating in the encounter    Chief complaint: Follow-up of thyroid   History of Present Illness:   History obtained on patient's initial visit: Patient had started losing weight in August 2017 and apparently had lost a total of about 30 pounds last year despite an increased appetite She did not discuss this with any physician Over the last couple of months also she has noticed that she is feeling excessively warm and sometimes sweaty.  Also has had tiredness. She does not think she has had significant symptoms of palpitations, shakiness, nervousness or change in bowel habits In early February she was admitted to the hospital for shortness of breath and worsening of her asthma She had a significantly high pulse rate and was found to be hyperthyroid She was started on methimazole 20 mg twice a day on her discharge on 03/31/16  Recent history:  She had her first I-131 treatment 06/15/2017 with 28.9 mCi  She did have improvement in her thyroid levels and free T4 was back to normal in July and was told to stop her methimazole However she had progressively increasing thyroid function subsequently  She had her second I-131 treatment with 28.7 mCi on 12/04/2017  On her follow-up in 1/20 although she was not  complaining of any fatigue her free T4 was low normal She was started on 25 mcg levothyroxine This was further increased on follow-up since her free T4 was down to 0.59 she had been asymptomatic She takes her levothyroxine before breakfast  She continues to feel fairly good with energy level, has no insomnia, feeling hot or sweaty Also no palpitations or shakiness She thinks her weight is about the same as on her last visit Also she thinks that her neck swelling with the goiter has been relatively small  Her free T4 is however higher than usual at 1.32 and TSH is still suppressed   Wt Readings from Last 3 Encounters:  05/14/18 185 lb (83.9 kg)  03/14/18 180 lb (81.6 kg)  01/24/18 164 lb (74.4 kg)       Thyroid function tests as follows:     Lab Results  Component Value Date   FREET4 1.32 07/12/2018   FREET4 0.59 (L) 05/10/2018   FREET4 0.67 03/12/2018   FREET4 0.92 01/22/2018   T3FREE 3.1 05/10/2018  T3FREE 2.7 03/12/2018   T3FREE 4.0 01/22/2018   T3FREE 10.8 (H) 11/30/2017    Lab Results  Component Value Date   TSH 0.016 (L) 07/12/2018   Her thyroid scan previously showed hot and cold nodules and uptake of 54% of the first measurement and follow-up measurement in 12/18 was 76.8% Prior to second I-131 treatment the 24 uptake is only 13.4%, 4-hour uptake 32%  No results found for: THYROTRECAB     Allergies as of 07/18/2018      Reactions   Aspirin Shortness Of Breath      Medication List       Accurate as of Jul 18, 2018  8:55 AM. If you have any questions, ask your nurse or doctor.        acetaminophen 325 MG tablet Commonly known as:  TYLENOL Take 325-650 mg by mouth every 6 (six) hours as needed for mild pain or headache.   albuterol (2.5 MG/3ML) 0.083% nebulizer solution Commonly known as:  PROVENTIL Take 3 mLs (2.5 mg total) by nebulization every 6 (six) hours as needed. asthma   albuterol 108 (90 Base) MCG/ACT inhaler Commonly known as:   VENTOLIN HFA Inhale 2 puffs into the lungs every 6 (six) hours as needed for wheezing.   budesonide-formoterol 160-4.5 MCG/ACT inhaler Commonly known as:  SYMBICORT Inhale 2 puffs into the lungs 2 (two) times daily.   famotidine 20 MG tablet Commonly known as:  Pepcid One at bedtime What changed:    how much to take  how to take this  when to take this   fluticasone 50 MCG/ACT nasal spray Commonly known as:  FLONASE Place 2 sprays into the nose daily. What changed:    how to take this  when to take this  reasons to take this   levothyroxine 50 MCG tablet Commonly known as:  SYNTHROID Take 1 tablet (50 mcg total) by mouth daily.   montelukast 10 MG tablet Commonly known as:  Singulair Take 1 tablet (10 mg total) by mouth at bedtime.   pantoprazole 40 MG tablet Commonly known as:  PROTONIX Take 1 tablet (40 mg total) by mouth daily before breakfast.           Past Medical History:  Diagnosis Date  . Asthma   . GERD (gastroesophageal reflux disease)   . Heart murmur   . Pneumonia 03/28/2016    Past Surgical History:  Procedure Laterality Date  . CATARACT EXTRACTION W/ INTRAOCULAR LENS  IMPLANT, BILATERAL  2011 &2009  . TONSILLECTOMY    . TUBAL LIGATION      Family History  Problem Relation Age of Onset  . Asthma Sister   . Emphysema Father        was a smoker  . Hypertension Mother   . Hypertension Sister   . Thyroid disease Neg Hx   . Diabetes Neg Hx     Social History:  reports that she has never smoked. She has never used smokeless tobacco. She reports current alcohol use. She reports that she does not use drugs.  Allergies:  Allergies  Allergen Reactions  . Aspirin Shortness Of Breath      Review of Systems  She has chronic asthma   Examination:   There were no vitals taken for this visit.             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   Although she low free T4  levels indicating hypothyroidism now  with taking 50 mcg levothyroxine supplementation her free T4 level is higher than her usual maintenance levels on treatment TSH is still significantly suppressed  She is currently asymptomatic Unable to examine today but she thinks that her goiter is not any larger  She may not be hypothyroid long-term and transient hypothyroidism may be from stunning of the thyroid after I-131 For now we will stop her levothyroxine to see what her thyroid levels are especially since she is asymptomatic  She will follow-up in 2 months for follow-up   Total telephone visit time 5 minutes  There are no Patient Instructions on file for this visit.    Elayne Snare 07/18/2018, 8:55 AM

## 2018-09-18 ENCOUNTER — Other Ambulatory Visit (INDEPENDENT_AMBULATORY_CARE_PROVIDER_SITE_OTHER): Payer: Medicare Other

## 2018-09-18 ENCOUNTER — Other Ambulatory Visit: Payer: Self-pay

## 2018-09-18 DIAGNOSIS — E042 Nontoxic multinodular goiter: Secondary | ICD-10-CM

## 2018-09-18 LAB — T4, FREE: Free T4: 0.72 ng/dL (ref 0.60–1.60)

## 2018-09-18 LAB — T3, FREE: T3, Free: 2.7 pg/mL (ref 2.3–4.2)

## 2018-09-18 LAB — TSH: TSH: 0.15 u[IU]/mL — ABNORMAL LOW (ref 0.35–4.50)

## 2018-09-20 ENCOUNTER — Ambulatory Visit (INDEPENDENT_AMBULATORY_CARE_PROVIDER_SITE_OTHER): Payer: Medicare Other | Admitting: Endocrinology

## 2018-09-20 ENCOUNTER — Other Ambulatory Visit: Payer: Self-pay

## 2018-09-20 ENCOUNTER — Encounter: Payer: Self-pay | Admitting: Endocrinology

## 2018-09-20 DIAGNOSIS — E042 Nontoxic multinodular goiter: Secondary | ICD-10-CM

## 2018-09-20 NOTE — Progress Notes (Signed)
Patient ID: [name], female   DOB: 1945/09/11, 73 y.o.   MRN: [id number]                                                                                                                 Reason for Appointment:  Hyperthyroidism, follow-up   Today's office visit was provided via telemedicine using video technique Explained to the patient and the the limitations of evaluation and management by telemedicine and the availability of in person appointments.  The patient understood the limitations and agreed to proceed. Patient also understood that the telehealth visit is billable. . Location of the patient: Home . Location of the provider: Office Only the patient and myself were participating in the encounter    Chief complaint: Follow-up of thyroid   History of Present Illness:   History obtained on patient's initial visit: Patient had started losing weight in August 2017 and apparently had lost a total of about 30 pounds last year despite an increased appetite She did not discuss this with any physician Over the last couple of months also she has noticed that she is feeling excessively warm and sometimes sweaty.  Also has had tiredness. She does not think she has had significant symptoms of palpitations, shakiness, nervousness or change in bowel habits In early February she was admitted to the hospital for shortness of breath and worsening of her asthma She had a significantly high pulse rate and was found to be hyperthyroid She was started on methimazole 20 mg twice a day on her discharge on 03/31/16  Recent history:  She had her first I-131 treatment 06/15/2017 with 28.9 mCi  She did have improvement in her thyroid levels and free T4 was back to normal in July and was told to stop her methimazole However she had progressively increasing thyroid function subsequently  She had her second I-131 treatment with 28.7 mCi on 12/04/2017  On her follow-up in 1/20 although she was not  complaining of any fatigue her free T4 was low normal She was started on 25 mcg levothyroxine and this was subsequently increased to 50 mcg  However in 06/2018 she had an asymptomatic increase in her free T4 up to 1.3 along with suppressed TSH as before She is now not taking any levothyroxine  She did not complain of any fatigue and has good energy level Did not have any temperature sensitivity or heat intolerance, shakiness She thinks her weight is about 187 pounds Does not report any increased swelling of her thyroid area of the neck  Her TSH is now not suppressed as much as before Free T4 is normal at 0.7 and T3 is also normal   Wt Readings from Last 3 Encounters:  05/14/18 185 lb (83.9 kg)  03/14/18 180 lb (81.6 kg)  01/24/18 164 lb (74.4 kg)       Thyroid function tests as follows:     Lab Results  Component Value Date   FREET4 0.72 09/18/2018   FREET4 1.32 07/12/2018   FREET4 0.59 (  L) 05/10/2018   FREET4 0.67 03/12/2018   T3FREE 2.7 09/18/2018   T3FREE 3.1 05/10/2018   T3FREE 2.7 03/12/2018   T3FREE 4.0 01/22/2018   Lab Results  Component Value Date   TSH 0.15 (L) 09/18/2018   TSH 0.016 (L) 07/12/2018   TSH <0.01 (L) 05/10/2018   FREET4 0.72 09/18/2018   FREET4 1.32 07/12/2018   FREET4 0.59 (L) 05/10/2018     Her thyroid scan previously showed hot and cold nodules and uptake of 54% of the first measurement and follow-up measurement in 12/18 was 76.8% Prior to second I-131 treatment the 24 uptake is only 13.4%, 4-hour uptake 32%  No results found for: THYROTRECAB     Allergies as of 09/20/2018      Reactions   Aspirin Shortness Of Breath      Medication List       Accurate as of September 20, 2018  8:59 AM. If you have any questions, ask your nurse or doctor.        acetaminophen 325 MG tablet Commonly known as: TYLENOL Take 325-650 mg by mouth every 6 (six) hours as needed for mild pain or headache.   albuterol (2.5 MG/3ML) 0.083% nebulizer  solution Commonly known as: PROVENTIL Take 3 mLs (2.5 mg total) by nebulization every 6 (six) hours as needed. asthma   albuterol 108 (90 Base) MCG/ACT inhaler Commonly known as: VENTOLIN HFA Inhale 2 puffs into the lungs every 6 (six) hours as needed for wheezing.   budesonide-formoterol 160-4.5 MCG/ACT inhaler Commonly known as: SYMBICORT Inhale 2 puffs into the lungs 2 (two) times daily.   famotidine 20 MG tablet Commonly known as: Pepcid One at bedtime What changed:   how much to take  how to take this  when to take this   fluticasone 50 MCG/ACT nasal spray Commonly known as: FLONASE Place 2 sprays into the nose daily. What changed:   how to take this  when to take this  reasons to take this   levothyroxine 50 MCG tablet Commonly known as: SYNTHROID Take 1 tablet (50 mcg total) by mouth daily.   montelukast 10 MG tablet Commonly known as: Singulair Take 1 tablet (10 mg total) by mouth at bedtime.   pantoprazole 40 MG tablet Commonly known as: PROTONIX Take 1 tablet (40 mg total) by mouth daily before breakfast.           Past Medical History:  Diagnosis Date  . Asthma   . GERD (gastroesophageal reflux disease)   . Heart murmur   . Pneumonia 03/28/2016    Past Surgical History:  Procedure Laterality Date  . CATARACT EXTRACTION W/ INTRAOCULAR LENS  IMPLANT, BILATERAL  2011 &2009  . TONSILLECTOMY    . TUBAL LIGATION      Family History  Problem Relation Age of Onset  . Asthma Sister   . Emphysema Father        was a smoker  . Hypertension Mother   . Hypertension Sister   . Thyroid disease Neg Hx   . Diabetes Neg Hx     Social History:  reports that she has never smoked. She has never used smokeless tobacco. She reports current alcohol use. She reports that she does not use drugs.  Allergies:  Allergies  Allergen Reactions  . Aspirin Shortness Of Breath      Review of Systems  She has chronic asthma   Examination:   There were  no vitals taken for this visit.  Assessment/Plan:  History of toxic nodular goiter   Although she did have transient hypothyroidism earlier this year now without any thyroid supplements her thyroid levels are fairly good Free T4 and T3 are low normal but TSH is still slightly suppressed This is compared to high normal free T4 level on 50 mcg levothyroxine  She is asymptomatic and feels good Her previous exam had shown relatively small size of her multinodular goiter  Recommend that we continue with to follow her without any thyroid supplement She will report any unusual fatigue or temperature sensitivity  She will follow-up in 3 months for follow-up Discussed that we should try and examine her thyroid in the office on the next visit    There are no Patient Instructions on file for this visit.    Elayne Snare 09/20/2018, 8:59 AM

## 2018-10-10 ENCOUNTER — Telehealth: Payer: Self-pay | Admitting: Nurse Practitioner

## 2018-10-10 NOTE — Telephone Encounter (Signed)
I called and left message with patient son to have patient to call office and schedule a follow up appointment with [facility].

## 2018-12-24 ENCOUNTER — Other Ambulatory Visit: Payer: Medicare Other

## 2018-12-26 ENCOUNTER — Ambulatory Visit: Payer: Medicare Other | Admitting: Endocrinology

## 2018-12-27 ENCOUNTER — Telehealth: Payer: Self-pay | Admitting: Endocrinology

## 2018-12-27 NOTE — Telephone Encounter (Signed)
ATC VM full. 

## 2018-12-27 NOTE — Telephone Encounter (Signed)
-----   Message from Elayne Snare, MD sent at 12/26/2018  9:02 AM EST ----- Regarding: Missed appointments Please call her to schedule a follow-up appointment for labs and then office visit for her thyroid

## 2019-01-03 NOTE — Telephone Encounter (Signed)
ATC-VM Full x 2

## 2019-01-08 ENCOUNTER — Encounter: Payer: Self-pay | Admitting: Endocrinology

## 2019-01-08 NOTE — Telephone Encounter (Signed)
LMTCB to schedule labs and follow up. Mailed Letter.

## 2019-03-27 DIAGNOSIS — M25561 Pain in right knee: Secondary | ICD-10-CM | POA: Diagnosis not present

## 2019-03-29 DIAGNOSIS — M25561 Pain in right knee: Secondary | ICD-10-CM | POA: Diagnosis not present

## 2022-10-19 ENCOUNTER — Encounter: Payer: Self-pay | Admitting: Family

## 2022-10-19 ENCOUNTER — Ambulatory Visit: Payer: Medicare (Managed Care) | Admitting: Family

## 2022-10-19 VITALS — BP 147/77 | HR 60 | Temp 97.9°F | Ht 69.75 in | Wt 179.4 lb

## 2022-10-19 DIAGNOSIS — Z7689 Persons encountering health services in other specified circumstances: Secondary | ICD-10-CM | POA: Diagnosis not present

## 2022-10-19 DIAGNOSIS — E0590 Thyrotoxicosis, unspecified without thyrotoxic crisis or storm: Secondary | ICD-10-CM

## 2022-10-19 NOTE — Progress Notes (Signed)
Subjective:    [name] - 77 y.o. female MRN [id number]  Date of birth: 05-May-1945  HPI  [name] is to establish care.   Current issues and/or concerns: - Reports history hyperthyroidism. Reports she is a former patient of [facility] and the provider there prescribed her Levothyroxine 112 mcg daily for management of hyperthyroidism. States recently she has symptoms of hair loss and frequent bowel movements and this is usually related to hyperthyroidism.  - Established with Cardiology.  - Established with ENT. - No further issues/concerns for discussion today.  ROS per HPI     Health Maintenance:  Health Maintenance Due  Topic Date Due   Zoster Vaccines- Shingrix (1 of 2) Never done   DEXA SCAN  Never done   Colonoscopy  11/28/2016   COVID-19 Vaccine (2 - 2023-24 season) 10/21/2021   INFLUENZA VACCINE  09/21/2022   Medicare Annual Wellness (AWV)  10/26/2022     Past Medical History: Patient Active Problem List   Diagnosis Date Noted   Syncope 12/22/2016   Hyperthyroidism 12/22/2016   Alkaline phosphatase elevation 11/08/2016   Anemia 06/30/2016   GERD (gastroesophageal reflux disease) 06/30/2016   Elevated BP without diagnosis of hypertension 05/01/2016   Solitary pulmonary nodule 04/10/2016   Influenza with pneumonia 03/29/2016   Nonspecific abnormal electrocardiogram (ECG) (EKG) 03/29/2016   Polycythemia 02/23/2015   Hyperglycemia 02/23/2015   Asthma, chronic 05/04/2012   Acute asthma exacerbation 03/31/2012   Hypokalemia 03/31/2012    Social History   reports that she has never smoked. She has never used smokeless tobacco. She reports current alcohol use. She reports that she does not use drugs.   Family History  family history includes Asthma in her sister; Emphysema in her father; Hypertension in her mother and sister.   Medications: reviewed and updated   Objective:   Physical Exam BP (!) 147/77   Pulse 60   Temp 97.9 F (36.6 C) (Oral)    Ht 5' 9.75" (1.772 m)   Wt 179 lb 6.4 oz (81.4 kg)   SpO2 98%   BMI 25.93 kg/m   Physical Exam HENT:     Head: Normocephalic and atraumatic.     Nose: Nose normal.     Mouth/Throat:     Mouth: Mucous membranes are moist.     Pharynx: Oropharynx is clear.  Eyes:     Extraocular Movements: Extraocular movements intact.     Conjunctiva/sclera: Conjunctivae normal.     Pupils: Pupils are equal, round, and reactive to light.  Cardiovascular:     Rate and Rhythm: Normal rate and regular rhythm.     Pulses: Normal pulses.     Heart sounds: Normal heart sounds.  Pulmonary:     Effort: Pulmonary effort is normal.     Breath sounds: Normal breath sounds.  Musculoskeletal:        General: Normal range of motion.     Cervical back: Normal range of motion and neck supple.  Neurological:     General: No focal deficit present.     Mental Status: She is alert and oriented to person, place, and time.  Psychiatric:        Mood and Affect: Mood normal.        Behavior: Behavior normal.        Assessment & Plan:  1. Encounter to establish care - Patient presents today to establish care. During the interim follow-up with primary provider as scheduled.  - Return for annual physical examination, labs,  and health maintenance. Arrive fasting meaning having no food for at least 8 hours prior to appointment. You may have only water or black coffee. Please take scheduled medications as normal.  2. Hyperthyroidism - Patient reports taking Levothyroxine 112 mcg daily. - Routine screening. Will update medication regimen once lab results. - Follow-up with primary provider as scheduled. - TSH    Patient was given clear instructions to go to Emergency Department or return to medical center if symptoms don't improve, worsen, or new problems develop.The patient verbalized understanding.  I discussed the assessment and treatment plan with the patient. The patient was provided an opportunity to ask  questions and all were answered. The patient agreed with the plan and demonstrated an understanding of the instructions.   The patient was advised to call back or seek an in-person evaluation if the symptoms worsen or if the condition fails to improve as anticipated.    Ricky Stabs, NP 10/19/2022, 10:28 AM Primary Care at [facility]

## 2022-10-19 NOTE — Progress Notes (Signed)
Pt stated she was diagnosed with hyperthyroidism and she states its's beginning to act up again and she can feel it. States limited amount of medication.

## 2022-10-20 ENCOUNTER — Other Ambulatory Visit: Payer: Self-pay | Admitting: Family

## 2022-10-20 DIAGNOSIS — E039 Hypothyroidism, unspecified: Secondary | ICD-10-CM

## 2022-10-20 LAB — TSH: TSH: 10.4 u[IU]/mL — ABNORMAL HIGH (ref 0.450–4.500)

## 2022-10-20 MED ORDER — LEVOTHYROXINE SODIUM 125 MCG PO TABS: 125.0000 ug | ORAL_TABLET | Freq: Every day | ORAL | 1 refills | Status: DC

## 2022-11-16 ENCOUNTER — Ambulatory Visit: Payer: Medicare (Managed Care) | Admitting: Family Medicine

## 2022-11-30 ENCOUNTER — Other Ambulatory Visit: Payer: Self-pay

## 2022-11-30 DIAGNOSIS — E039 Hypothyroidism, unspecified: Secondary | ICD-10-CM

## 2022-12-07 ENCOUNTER — Other Ambulatory Visit: Payer: Medicare (Managed Care)

## 2022-12-07 DIAGNOSIS — E039 Hypothyroidism, unspecified: Secondary | ICD-10-CM

## 2022-12-07 LAB — T4, FREE: Free T4: 0.63 ng/dL (ref 0.60–1.60)

## 2022-12-07 LAB — TSH: TSH: 10.86 u[IU]/mL — ABNORMAL HIGH (ref 0.35–5.50)

## 2022-12-11 ENCOUNTER — Ambulatory Visit: Payer: Medicare (Managed Care) | Admitting: "Endocrinology

## 2022-12-23 ENCOUNTER — Other Ambulatory Visit: Payer: Self-pay | Admitting: Family

## 2022-12-23 DIAGNOSIS — E039 Hypothyroidism, unspecified: Secondary | ICD-10-CM

## 2022-12-25 ENCOUNTER — Other Ambulatory Visit: Payer: Self-pay

## 2022-12-25 DIAGNOSIS — E039 Hypothyroidism, unspecified: Secondary | ICD-10-CM

## 2022-12-25 MED ORDER — LEVOTHYROXINE SODIUM 125 MCG PO TABS: 125.0000 ug | ORAL_TABLET | Freq: Every day | ORAL | 1 refills | Status: DC

## 2023-01-09 ENCOUNTER — Encounter: Payer: Self-pay | Admitting: "Endocrinology

## 2023-01-09 ENCOUNTER — Ambulatory Visit: Payer: Medicare (Managed Care) | Admitting: "Endocrinology

## 2023-01-09 VITALS — BP 140/84 | HR 81 | Ht 69.0 in | Wt 182.6 lb

## 2023-01-09 DIAGNOSIS — E890 Postprocedural hypothyroidism: Secondary | ICD-10-CM | POA: Diagnosis not present

## 2023-01-09 MED ORDER — LEVOTHYROXINE SODIUM 137 MCG PO TABS: 137.0000 ug | ORAL_TABLET | Freq: Every day | ORAL | 1 refills | Status: DC

## 2023-01-09 NOTE — Progress Notes (Signed)
Outpatient Endocrinology Note Terri Manvel, MD  01/09/23   [name] 04/26/1945 [id number]  Referring Provider: Rema Fendt, NP Primary Care Provider: Rema Fendt, NP Subjective  No chief complaint on file.   Assessment & Plan  Diagnoses and all orders for this visit:  Postablative hypothyroidism -     TSH Rfx on Abnormal to Free T4; Future  Other orders -     levothyroxine (LEVOXYL) 137 MCG tablet; Take 1 tablet (137 mcg total) by mouth daily before breakfast.    [name] is currently taking levothyroxine 125 mcg po every day, but takes it with other medication. Patient is currently biochemically hypothyroid.  Educated on thyroid axis.  Recommend the following: Take levothyroxine 137 mcg po every morning.  Advised to take levothyroxine first thing in the morning on empty stomach and wait at least 30 minutes to 1 hour before eating or drinking anything or taking any other medications. Space out levothyroxine by 4 hours from any acid reflux medication/fibrate/iron/calcium/multivitamin. Advised to take birth control pills and nutritional supplements in the evening. Repeat lab before next visit or sooner if symptoms of hyperthyroidism or hypothyroidism develop.  Notify us immediately in case of significant weight gain or loss. Counseled on compliance and follow up needs.  I have reviewed current medications, nurse's notes, allergies, vital signs, past medical and surgical history, family medical history, and social history for this encounter. Counseled patient on symptoms, examination findings, lab findings, imaging results, treatment decisions and monitoring and prognosis. The patient understood the recommendations and agrees with the treatment plan. All questions regarding treatment plan were fully answered.   Return in about 6 weeks (around 02/20/2023) for visit + labs before next visit.   Terri Chester, MD  01/09/23   I have reviewed current  medications, nurse's notes, allergies, vital signs, past medical and surgical history, family medical history, and social history for this encounter. Counseled patient on symptoms, examination findings, lab findings, imaging results, treatment decisions and monitoring and prognosis. The patient understood the recommendations and agrees with the treatment plan. All questions regarding treatment plan were fully answered.   History of Present Illness [name] is a 78 y.o. year old female who presents to our clinic with hypothyroidism diagnosed in 2017.    History obtained on patient's initial visit: Patient had started losing weight in August 2017 and apparently had lost a total of about 30 pounds last year despite an increased appetite. She was started on methimazole 20 mg twice a day on her discharge on 03/31/16. She had her first I-131 treatment 06/15/2017 with 28.9 mCi. She had her second I-131 treatment with 28.7 mCi on 12/04/2017.  On levothyroxine 125 mcg po every day   Symptoms suggestive of HYPOTHYROIDISM:  fatigue No weight gain No cold intolerance  Yes constipation  No  Symptoms suggestive of HYPERTHYROIDISM:  weight loss  No heat intolerance No hyperdefecation  No palpitations  No  Compressive symptoms:  dysphagia  No dysphonia  No positional dyspnea (especially with simultaneous arms elevation)  No  Smokes  No On biotin  No Personal history of head/neck surgery/irradiation  No  Physical Exam  BP (!) 140/84   Pulse 81   Ht 5\' 9"  (1.753 m)   Wt 182 lb 9.6 oz (82.8 kg)   SpO2 95%   BMI 26.97 kg/m  Constitutional: well developed, well nourished Head: normocephalic, atraumatic, no exophthalmos Eyes: sclera anicteric, no redness Neck: no thyromegaly, no thyroid tenderness; no nodules palpated  Lungs: normal respiratory effort Neurology: alert and oriented, no fine hand tremor Skin: dry, no appreciable rashes Musculoskeletal: no appreciable defects Psychiatric:  normal mood and affect  Allergies Allergies  Allergen Reactions   Aspirin Shortness Of Breath    Current Medications Patient's Medications  New Prescriptions   LEVOTHYROXINE (LEVOXYL) 137 MCG TABLET    Take 1 tablet (137 mcg total) by mouth daily before breakfast.  Previous Medications   ACETAMINOPHEN (TYLENOL) 325 MG TABLET    Take 325-650 mg by mouth every 6 (six) hours as needed for mild pain or headache.   ALBUTEROL (PROVENTIL HFA;VENTOLIN HFA) 108 (90 BASE) MCG/ACT INHALER    Inhale 2 puffs into the lungs every 6 (six) hours as needed for wheezing.   ALBUTEROL (PROVENTIL) (2.5 MG/3ML) 0.083% NEBULIZER SOLUTION    Take 3 mLs (2.5 mg total) by nebulization every 6 (six) hours as needed. asthma   BUDESONIDE-FORMOTEROL (SYMBICORT) 160-4.5 MCG/ACT INHALER    Inhale 2 puffs into the lungs 2 (two) times daily.   FAMOTIDINE (PEPCID) 20 MG TABLET    One at bedtime   FLUTICASONE (FLONASE) 50 MCG/ACT NASAL SPRAY    Place 2 sprays into the nose daily.   MONTELUKAST (SINGULAIR) 10 MG TABLET    Take 1 tablet (10 mg total) by mouth at bedtime.   OMEPRAZOLE (PRILOSEC) 10 MG CAPSULE    Take 10 mg by mouth daily.   OXYBUTYNIN (DITROPAN-XL) 5 MG 24 HR TABLET    Take 5 mg by mouth at bedtime.   PANTOPRAZOLE (PROTONIX) 40 MG TABLET    Take 1 tablet (40 mg total) by mouth daily before breakfast.  Modified Medications   No medications on file  Discontinued Medications   LEVOTHYROXINE (SYNTHROID) 125 MCG TABLET    Take 1 tablet by mouth once daily   LEVOTHYROXINE (SYNTHROID) 125 MCG TABLET    Take 1 tablet (125 mcg total) by mouth daily.    Past Medical History Past Medical History:  Diagnosis Date   Asthma    GERD (gastroesophageal reflux disease)    Heart murmur    Pneumonia 03/28/2016    Past Surgical History Past Surgical History:  Procedure Laterality Date   CATARACT EXTRACTION W/ INTRAOCULAR LENS  IMPLANT, BILATERAL  2011 &2009   TONSILLECTOMY     TUBAL LIGATION      Family  History family history includes Asthma in her sister; Emphysema in her father; Hypertension in her mother and sister.  Social History Social History   Socioeconomic History   Marital status: Widowed    Spouse name: Not on file   Number of children: 1   Years of education: Not on file   Highest education level: Not on file  Occupational History   Occupation: Scientist, forensic: [employer]  Tobacco Use   Smoking status: Never   Smokeless tobacco: Never  Substance and Sexual Activity   Alcohol use: Yes    Comment: maybe 1-2 times annually   Drug use: No   Sexual activity: Not Currently    Birth control/protection: None  Other Topics Concern   Not on file  Social History Narrative   Not on file   Social Determinants of Health   Financial Resource Strain: Not on file  Food Insecurity: Not on file  Transportation Needs: Not on file  Physical Activity: Not on file  Stress: Not on file  Social Connections: Not on file  Intimate Partner Violence: Not on file    Laboratory Investigations Lab  Results  Component Value Date   TSH 10.86 (H) 12/07/2022   TSH 10.400 (H) 10/19/2022   TSH 0.15 (L) 09/18/2018   FREET4 0.63 12/07/2022   FREET4 0.72 09/18/2018   FREET4 1.32 07/12/2018     No results found for: "TSI"   No components found for: "TRAB"   Lab Results  Component Value Date   CHOL 107 08/04/2016   Lab Results  Component Value Date   HDL 45.00 08/04/2016   Lab Results  Component Value Date   LDLCALC 47 08/04/2016   Lab Results  Component Value Date   TRIG 76.0 08/04/2016   Lab Results  Component Value Date   CHOLHDL 2 08/04/2016   Lab Results  Component Value Date   CREATININE 0.72 10/18/2017   Lab Results  Component Value Date   GFR 107.24 08/04/2016      Component Value Date/Time   NA 141 10/18/2017 1959   K 3.5 10/18/2017 1959   CL 105 10/18/2017 1959   CO2 24 10/18/2017 1959   GLUCOSE 106 (H) 10/18/2017 1959   BUN 10 10/18/2017  1959   CREATININE 0.72 10/18/2017 1959   CALCIUM 8.9 10/18/2017 1959   PROT 7.3 12/22/2016 1525   ALBUMIN 3.0 (L) 12/22/2016 1525   AST 22 12/22/2016 1525   ALT 10 10/08/2017 1226   ALKPHOS 151 (H) 12/22/2016 1525   BILITOT 0.8 12/22/2016 1525   GFRNONAA >60 10/18/2017 1959   GFRAA >60 10/18/2017 1959      Latest Ref Rng & Units 10/18/2017    7:59 PM 12/23/2016    9:17 AM 12/22/2016    8:12 PM  BMP  Glucose 70 - 99 mg/dL 960  454    BUN 8 - 23 mg/dL 10  6    Creatinine 0.98 - 1.00 mg/dL 1.19  1.47  8.29   Sodium 135 - 145 mmol/L 141  140    Potassium 3.5 - 5.1 mmol/L 3.5  3.4    Chloride 98 - 111 mmol/L 105  109    CO2 22 - 32 mmol/L 24  23    Calcium 8.9 - 10.3 mg/dL 8.9  9.1         Component Value Date/Time   WBC 12.5 (H) 10/18/2017 1959   RBC 4.50 10/18/2017 1959   HGB 10.3 (L) 10/18/2017 1959   HCT 34.4 (L) 10/18/2017 1959   PLT 269 10/18/2017 1959   MCV 76.4 (L) 10/18/2017 1959   MCV 78.5 (A) 04/21/2011 1432   MCH 22.9 (L) 10/18/2017 1959   MCHC 29.9 (L) 10/18/2017 1959   RDW 15.9 (H) 10/18/2017 1959   LYMPHSABS 2.0 10/18/2017 1959   MONOABS 1.1 (H) 10/18/2017 1959   EOSABS 0.2 10/18/2017 1959   BASOSABS 0.1 10/18/2017 1959      Parts of this note may have been dictated using voice recognition software. There may be variances in spelling and vocabulary which are unintentional. Not all errors are proofread. Please notify the Thereasa Parkin if any discrepancies are noted or if the meaning of any statement is not clear.

## 2023-01-22 ENCOUNTER — Telehealth: Payer: Self-pay | Admitting: Family

## 2023-01-22 ENCOUNTER — Ambulatory Visit: Payer: Medicare (Managed Care) | Admitting: Family

## 2023-01-22 ENCOUNTER — Encounter: Payer: Self-pay | Admitting: Family

## 2023-01-22 VITALS — BP 164/89 | HR 64 | Temp 98.0°F | Ht 69.75 in | Wt 184.4 lb

## 2023-01-22 DIAGNOSIS — R030 Elevated blood-pressure reading, without diagnosis of hypertension: Secondary | ICD-10-CM | POA: Diagnosis not present

## 2023-01-22 DIAGNOSIS — Z8679 Personal history of other diseases of the circulatory system: Secondary | ICD-10-CM

## 2023-01-22 DIAGNOSIS — R32 Unspecified urinary incontinence: Secondary | ICD-10-CM

## 2023-01-22 MED ORDER — OXYBUTYNIN CHLORIDE ER 5 MG PO TB24: 5.0000 mg | ORAL_TABLET | Freq: Every day | ORAL | 0 refills | Status: AC

## 2023-01-22 NOTE — Telephone Encounter (Signed)
Called pt and left vm if pt can come back for shingles vaccination per CMA Curley Spice

## 2023-01-22 NOTE — Progress Notes (Signed)
Patient states her thyroid medication keeps her cold.   Declined Flu vaccine.

## 2023-01-22 NOTE — Progress Notes (Signed)
Patient ID: [name], female    DOB: 01-Oct-1945  MRN: [id number]  CC: Chronic Conditions Follow-Up  Subjective: [name] is a 77 y.o. female who presents for chronic conditions follow-up.  Her concerns today include:  - Reports history of high blood pressure and childhood heart murmur. States in the past high blood pressure related to thyroid issues and she took blood pressure medication for a short time in 2018 or 2019 and none since then. She does not complain of red flag symptoms such as but not limited to chest pain, shortness of breath, worst headache of life, nausea/vomiting.  - Established with Endocrinology for thyroid management.  - Needs refills on Oxybutynin for urinary incontinence.   Patient Active Problem List   Diagnosis Date Noted   Syncope 12/22/2016   Hyperthyroidism 12/22/2016   Alkaline phosphatase elevation 11/08/2016   Anemia 06/30/2016   GERD (gastroesophageal reflux disease) 06/30/2016   Elevated BP without diagnosis of hypertension 05/01/2016   Solitary pulmonary nodule 04/10/2016   Influenza with pneumonia 03/29/2016   Nonspecific abnormal electrocardiogram (ECG) (EKG) 03/29/2016   Polycythemia 02/23/2015   Hyperglycemia 02/23/2015   Asthma, chronic 05/04/2012   Acute asthma exacerbation 03/31/2012   Hypokalemia 03/31/2012     Current Outpatient Medications on File Prior to Visit  Medication Sig Dispense Refill   acetaminophen (TYLENOL) 325 MG tablet Take 325-650 mg by mouth every 6 (six) hours as needed for mild pain or headache.     budesonide-formoterol (SYMBICORT) 160-4.5 MCG/ACT inhaler Inhale 2 puffs into the lungs 2 (two) times daily. 3 Inhaler 3   famotidine (PEPCID) 20 MG tablet One at bedtime (Patient taking differently: Take 20 mg by mouth at bedtime. One at bedtime)     levothyroxine (LEVOXYL) 137 MCG tablet Take 1 tablet (137 mcg total) by mouth daily before breakfast. 90 tablet 1   omeprazole (PRILOSEC) 10 MG capsule Take 10 mg  by mouth daily.     albuterol (PROVENTIL HFA;VENTOLIN HFA) 108 (90 Base) MCG/ACT inhaler Inhale 2 puffs into the lungs every 6 (six) hours as needed for wheezing. (Patient not taking: Reported on 01/09/2023) 1 Inhaler 1   albuterol (PROVENTIL) (2.5 MG/3ML) 0.083% nebulizer solution Take 3 mLs (2.5 mg total) by nebulization every 6 (six) hours as needed. asthma (Patient not taking: Reported on 01/22/2023) 75 mL 0   fluticasone (FLONASE) 50 MCG/ACT nasal spray Place 2 sprays into the nose daily. (Patient not taking: Reported on 01/09/2023) 48 g 3   montelukast (SINGULAIR) 10 MG tablet Take 1 tablet (10 mg total) by mouth at bedtime. (Patient not taking: Reported on 01/09/2023) 90 tablet 3   pantoprazole (PROTONIX) 40 MG tablet Take 1 tablet (40 mg total) by mouth daily before breakfast. (Patient not taking: Reported on 01/09/2023) 90 tablet 4   No current facility-administered medications on file prior to visit.    Allergies  Allergen Reactions   Aspirin Shortness Of Breath    Social History   Socioeconomic History   Marital status: Widowed    Spouse name: Not on file   Number of children: 1   Years of education: Not on file   Highest education level: Not on file  Occupational History   Occupation: Scientist, forensic: [employer]  Tobacco Use   Smoking status: Never   Smokeless tobacco: Never  Substance and Sexual Activity   Alcohol use: Yes    Comment: maybe 1-2 times annually   Drug use: No   Sexual activity: Not  Currently    Birth control/protection: None  Other Topics Concern   Not on file  Social History Narrative   Not on file   Social Determinants of Health   Financial Resource Strain: Low Risk  (01/22/2023)   Overall Financial Resource Strain (CARDIA)    Difficulty of Paying Living Expenses: Not hard at all  Food Insecurity: No Food Insecurity (01/22/2023)   Hunger Vital Sign    Worried About Running Out of Food in the Last Year: Never true    Ran Out of Food  in the Last Year: Never true  Transportation Needs: No Transportation Needs (01/22/2023)   PRAPARE - Administrator, Civil Service (Medical): No    Lack of Transportation (Non-Medical): No  Physical Activity: Sufficiently Active (01/22/2023)   Exercise Vital Sign    Days of Exercise per Week: 7 days    Minutes of Exercise per Session: 40 min  Stress: No Stress Concern Present (01/22/2023)   [employer] of Occupational Health - Occupational Stress Questionnaire    Feeling of Stress : Not at all  Social Connections: Moderately Integrated (01/22/2023)   Social Connection and Isolation Panel [NHANES]    Frequency of Communication with Friends and Family: More than three times a week    Frequency of Social Gatherings with Friends and Family: More than three times a week    Attends Religious Services: More than 4 times per year    Active Member of [employer] or Organizations: Yes    Attends Engineer, structural: More than 4 times per year    Marital Status: Divorced  Recent Concern: Social Connections - Moderately Isolated (01/22/2023)   Social Connection and Isolation Panel [NHANES]    Frequency of Communication with Friends and Family: More than three times a week    Frequency of Social Gatherings with Friends and Family: More than three times a week    Attends Religious Services: More than 4 times per year    Active Member of [employer] or Organizations: No    Attends Banker Meetings: Never    Marital Status: Divorced  Catering manager Violence: Not At Risk (01/22/2023)   Humiliation, Afraid, Rape, and Kick questionnaire    Fear of Current or Ex-Partner: No    Emotionally Abused: No    Physically Abused: No    Sexually Abused: No    Family History  Problem Relation Age of Onset   Asthma Sister    Emphysema Father        was a smoker   Hypertension Mother    Hypertension Sister    Thyroid disease Neg Hx    Diabetes Neg Hx     Past Surgical History:   Procedure Laterality Date   CATARACT EXTRACTION W/ INTRAOCULAR LENS  IMPLANT, BILATERAL  2011 &2009   TONSILLECTOMY     TUBAL LIGATION      ROS: Review of Systems Negative except as stated above  PHYSICAL EXAM: BP (!) 164/89   Pulse 64   Temp 98 F (36.7 C) (Oral)   Ht 5' 9.75" (1.772 m)   Wt 184 lb 6.4 oz (83.6 kg)   SpO2 97%   BMI 26.65 kg/m   Physical Exam HENT:     Head: Normocephalic and atraumatic.     Nose: Nose normal.     Mouth/Throat:     Mouth: Mucous membranes are moist.     Pharynx: Oropharynx is clear.  Eyes:     Extraocular Movements: Extraocular  movements intact.     Conjunctiva/sclera: Conjunctivae normal.     Pupils: Pupils are equal, round, and reactive to light.  Cardiovascular:     Rate and Rhythm: Normal rate and regular rhythm.     Pulses: Normal pulses.     Heart sounds: Normal heart sounds.  Pulmonary:     Effort: Pulmonary effort is normal.     Breath sounds: Normal breath sounds.  Musculoskeletal:        General: Normal range of motion.     Cervical back: Normal range of motion and neck supple.  Neurological:     General: No focal deficit present.     Mental Status: She is alert and oriented to person, place, and time.  Psychiatric:        Mood and Affect: Mood normal.        Behavior: Behavior normal.     ASSESSMENT AND PLAN: 1. Elevated blood pressure reading 2. History of heart murmur in childhood - Blood pressure not at goal during today's visit. Patient asymptomatic without chest pressure, chest pain, palpitations, shortness of breath, worst headache of life, and any additional red flag symptoms. - Referral to Cardiology for evaluation/management. During the interim follow-up with primary provider as scheduled until established with referral. - Ambulatory referral to Cardiology  3. Urinary incontinence, unspecified type - Continue Oxybutynin as prescribed. Counseled on medication adherence/adverse effects.  - Follow-up with  primary provider as scheduled.  - oxybutynin (DITROPAN-XL) 5 MG 24 hr tablet; Take 1 tablet (5 mg total) by mouth at bedtime.  Dispense: 90 tablet; Refill: 0   Patient was given the opportunity to ask questions.  Patient verbalized understanding of the plan and was able to repeat key elements of the plan. Patient was given clear instructions to go to Emergency Department or return to medical center if symptoms don't improve, worsen, or new problems develop.The patient verbalized understanding.    Requested Prescriptions   Signed Prescriptions Disp Refills   oxybutynin (DITROPAN-XL) 5 MG 24 hr tablet 90 tablet 0    Sig: Take 1 tablet (5 mg total) by mouth at bedtime.    Follow-up with primary provider as scheduled.   Rema Fendt, NP

## 2023-01-23 ENCOUNTER — Other Ambulatory Visit: Payer: Self-pay

## 2023-02-10 ENCOUNTER — Other Ambulatory Visit: Payer: Self-pay

## 2023-02-10 DIAGNOSIS — E890 Postprocedural hypothyroidism: Secondary | ICD-10-CM

## 2023-02-12 ENCOUNTER — Other Ambulatory Visit: Payer: Self-pay

## 2023-02-12 ENCOUNTER — Other Ambulatory Visit: Payer: Medicare (Managed Care)

## 2023-02-12 DIAGNOSIS — E039 Hypothyroidism, unspecified: Secondary | ICD-10-CM

## 2023-02-12 DIAGNOSIS — E890 Postprocedural hypothyroidism: Secondary | ICD-10-CM

## 2023-02-13 LAB — TSH(REFL): TSH: 0.03 m[IU]/L — ABNORMAL LOW (ref 0.40–4.50)

## 2023-02-13 LAB — REFLEX TIQ

## 2023-02-22 ENCOUNTER — Ambulatory Visit: Payer: Medicare (Managed Care) | Admitting: "Endocrinology

## 2023-02-22 ENCOUNTER — Encounter: Payer: Self-pay | Admitting: "Endocrinology

## 2023-02-22 VITALS — BP 136/84 | HR 89 | Ht 69.75 in | Wt 181.0 lb

## 2023-02-22 DIAGNOSIS — E890 Postprocedural hypothyroidism: Secondary | ICD-10-CM | POA: Diagnosis not present

## 2023-02-22 NOTE — Progress Notes (Signed)
 Outpatient Endocrinology Note Terri Birmingham, MD  02/22/23   [name] 1945/07/27 [id number]  Referring Provider: Lorren Greig PARAS, NP Primary Care Provider: Lorren Greig PARAS, NP Subjective  Chief Complaint  Patient presents with   Follow-up    Assessment & Plan  Azzure was seen today for follow-up.  Diagnoses and all orders for this visit:  Postablative hypothyroidism -     TSH(Reflex)    [name] is currently taking levothyroxine  137 mcg po every day: TSH 0.03. Patient is currently biochemically hypothyroid.  Educated on thyroid  axis.  Recommend the following: Take levothyroxine  137 mcg po every day except Sunday take half a pill.  Advised to take levothyroxine  first thing in the morning on empty stomach and wait at least 30 minutes to 1 hour before eating or drinking anything or taking any other medications. Space out levothyroxine  by 4 hours from any acid reflux medication/fibrate/iron/calcium /multivitamin. Advised to take birth control pills and nutritional supplements in the evening. Repeat lab before next visit or sooner if symptoms of hyperthyroidism or hypothyroidism develop.  Notify us  immediately in case of significant weight gain or loss. Counseled on compliance and follow up needs.  I have reviewed current medications, nurse's notes, allergies, vital signs, past medical and surgical history, family medical history, and social history for this encounter. Counseled patient on symptoms, examination findings, lab findings, imaging results, treatment decisions and monitoring and prognosis. The patient understood the recommendations and agrees with the treatment plan. All questions regarding treatment plan were fully answered.   Return in about 3 months (around 05/23/2023).   Terri Birmingham, MD  02/22/23   I have reviewed current medications, nurse's notes, allergies, vital signs, past medical and surgical history, family medical history, and social  history for this encounter. Counseled patient on symptoms, examination findings, lab findings, imaging results, treatment decisions and monitoring and prognosis. The patient understood the recommendations and agrees with the treatment plan. All questions regarding treatment plan were fully answered.   History of Present Illness [name] is a 78 y.o. year old female who presents to our clinic with hypothyroidism diagnosed in 2017.    History obtained on patient's initial visit: Patient had started losing weight in August 2017 and apparently had lost a total of about 30 pounds last year despite an increased appetite. She was started on methimazole  20 mg twice a day on her discharge on 03/31/16. She had her first I-131 treatment 06/15/2017 with 28.9 mCi. She had her second I-131 treatment with 28.7 mCi on 12/04/2017.  On levothyroxine  137 mcg po every day   Symptoms suggestive of HYPOTHYROIDISM:  fatigue No weight gain No cold intolerance  Yes constipation  No  Symptoms suggestive of HYPERTHYROIDISM:  weight loss  No heat intolerance No hyperdefecation  No palpitations  No  Compressive symptoms:  dysphagia  No dysphonia  No positional dyspnea (especially with simultaneous arms elevation)  No  Smokes  No On biotin  No Personal history of head/neck surgery/irradiation  No  Physical Exam  BP 136/84 (BP Location: Left Arm, Patient Position: Sitting, Cuff Size: Normal)   Pulse 89   Ht 5' 9.75 (1.772 m)   Wt 181 lb (82.1 kg)   SpO2 99%   BMI 26.16 kg/m  Constitutional: well developed, well nourished Head: normocephalic, atraumatic, no exophthalmos Eyes: sclera anicteric, no redness Neck: no thyromegaly, no thyroid  tenderness; no nodules palpated Lungs: normal respiratory effort Neurology: alert and oriented, no fine hand tremor Skin: dry, no appreciable rashes  Musculoskeletal: no appreciable defects Psychiatric: normal mood and affect  Allergies Allergies  Allergen  Reactions   Aspirin Shortness Of Breath    Current Medications Patient's Medications  New Prescriptions   No medications on file  Previous Medications   ACETAMINOPHEN  (TYLENOL ) 325 MG TABLET    Take 325-650 mg by mouth every 6 (six) hours as needed for mild pain or headache.   ALBUTEROL  (PROVENTIL ) (2.5 MG/3ML) 0.083% NEBULIZER SOLUTION    Take 3 mLs (2.5 mg total) by nebulization every 6 (six) hours as needed. asthma   AMLODIPINE (NORVASC) 5 MG TABLET    Take by mouth.   BUDESONIDE -FORMOTEROL  (SYMBICORT ) 160-4.5 MCG/ACT INHALER    Inhale 2 puffs into the lungs 2 (two) times daily.   FAMOTIDINE  (PEPCID ) 20 MG TABLET    One at bedtime   FERROUS GLUCONATE (FERGON) 324 MG TABLET    Take by mouth.   FLUTICASONE  (FLONASE ) 50 MCG/ACT NASAL SPRAY    Place 2 sprays into the nose daily.   IBANDRONATE (BONIVA) 150 MG TABLET    Take by mouth.   LEVOTHYROXINE  (LEVOXYL ) 137 MCG TABLET    Take 1 tablet (137 mcg total) by mouth daily before breakfast.   LEVOTHYROXINE  (SYNTHROID ) 125 MCG TABLET    Take 125 mcg by mouth daily.   OMEPRAZOLE (PRILOSEC) 10 MG CAPSULE    Take 10 mg by mouth daily.   OMEPRAZOLE (PRILOSEC) 20 MG CAPSULE    Take 1 capsule by mouth daily.   OXYBUTYNIN  (DITROPAN  XL) 15 MG 24 HR TABLET    Take 1 tablet by mouth daily.   OXYBUTYNIN  (DITROPAN -XL) 5 MG 24 HR TABLET    Take 1 tablet (5 mg total) by mouth at bedtime.   PANTOPRAZOLE  (PROTONIX ) 40 MG TABLET    Take 1 tablet (40 mg total) by mouth daily before breakfast.   VITAMIN D, ERGOCALCIFEROL, (DRISDOL) 1.25 MG (50000 UNIT) CAPS CAPSULE    Take by mouth.  Modified Medications   No medications on file  Discontinued Medications   ALBUTEROL  (PROVENTIL  HFA;VENTOLIN  HFA) 108 (90 BASE) MCG/ACT INHALER    Inhale 2 puffs into the lungs every 6 (six) hours as needed for wheezing.   LEVOTHYROXINE  (SYNTHROID ) 175 MCG TABLET    Take by mouth.   MONTELUKAST  (SINGULAIR ) 10 MG TABLET    Take 1 tablet (10 mg total) by mouth at bedtime.    Past  Medical History Past Medical History:  Diagnosis Date   Asthma    GERD (gastroesophageal reflux disease)    Heart murmur    Pneumonia 03/28/2016    Past Surgical History Past Surgical History:  Procedure Laterality Date   CATARACT EXTRACTION W/ INTRAOCULAR LENS  IMPLANT, BILATERAL  2011 &2009   TONSILLECTOMY     TUBAL LIGATION      Family History family history includes Asthma in her sister; Emphysema in her father; Hypertension in her mother and sister.  Social History Social History   Socioeconomic History   Marital status: Widowed    Spouse name: Not on file   Number of children: 1   Years of education: Not on file   Highest education level: Not on file  Occupational History   Occupation: Scientist, Forensic: [employer]  Tobacco Use   Smoking status: Never   Smokeless tobacco: Never  Substance and Sexual Activity   Alcohol use: Yes    Comment: maybe 1-2 times annually   Drug use: No   Sexual activity: Not Currently  Birth control/protection: None  Other Topics Concern   Not on file  Social History Narrative   Not on file   Social Drivers of Health   Financial Resource Strain: Low Risk  (01/22/2023)   Overall Financial Resource Strain (CARDIA)    Difficulty of Paying Living Expenses: Not hard at all  Food Insecurity: No Food Insecurity (01/22/2023)   Hunger Vital Sign    Worried About Running Out of Food in the Last Year: Never true    Ran Out of Food in the Last Year: Never true  Transportation Needs: No Transportation Needs (01/22/2023)   PRAPARE - Administrator, Civil Service (Medical): No    Lack of Transportation (Non-Medical): No  Physical Activity: Sufficiently Active (01/22/2023)   Exercise Vital Sign    Days of Exercise per Week: 7 days    Minutes of Exercise per Session: 40 min  Stress: No Stress Concern Present (01/22/2023)   [employer] of Occupational Health - Occupational Stress Questionnaire    Feeling of Stress :  Not at all  Social Connections: Moderately Integrated (01/22/2023)   Social Connection and Isolation Panel [NHANES]    Frequency of Communication with Friends and Family: More than three times a week    Frequency of Social Gatherings with Friends and Family: More than three times a week    Attends Religious Services: More than 4 times per year    Active Member of Clubs or Organizations: Yes    Attends Banker Meetings: More than 4 times per year    Marital Status: Divorced  Recent Concern: Social Connections - Moderately Isolated (01/22/2023)   Social Connection and Isolation Panel [NHANES]    Frequency of Communication with Friends and Family: More than three times a week    Frequency of Social Gatherings with Friends and Family: More than three times a week    Attends Religious Services: More than 4 times per year    Active Member of Clubs or Organizations: No    Attends Banker Meetings: Never    Marital Status: Divorced  Catering Manager Violence: Not At Risk (01/22/2023)   Humiliation, Afraid, Rape, and Kick questionnaire    Fear of Current or Ex-Partner: No    Emotionally Abused: No    Physically Abused: No    Sexually Abused: No    Laboratory Investigations Lab Results  Component Value Date   TSH 10.86 (H) 12/07/2022   TSH 10.400 (H) 10/19/2022   TSH 0.15 (L) 09/18/2018   FREET4 0.63 12/07/2022   FREET4 0.72 09/18/2018   FREET4 1.32 07/12/2018     No results found for: TSI   No components found for: TRAB   Lab Results  Component Value Date   CHOL 107 08/04/2016   Lab Results  Component Value Date   HDL 45.00 08/04/2016   Lab Results  Component Value Date   LDLCALC 47 08/04/2016   Lab Results  Component Value Date   TRIG 76.0 08/04/2016   Lab Results  Component Value Date   CHOLHDL 2 08/04/2016   Lab Results  Component Value Date   CREATININE 0.72 10/18/2017   Lab Results  Component Value Date   GFR 107.24 08/04/2016       Component Value Date/Time   NA 141 10/18/2017 1959   K 3.5 10/18/2017 1959   CL 105 10/18/2017 1959   CO2 24 10/18/2017 1959   GLUCOSE 106 (H) 10/18/2017 1959   BUN 10 10/18/2017 1959  CREATININE 0.72 10/18/2017 1959   CALCIUM  8.9 10/18/2017 1959   PROT 7.3 12/22/2016 1525   ALBUMIN 3.0 (L) 12/22/2016 1525   AST 22 12/22/2016 1525   ALT 10 10/08/2017 1226   ALKPHOS 151 (H) 12/22/2016 1525   BILITOT 0.8 12/22/2016 1525   GFRNONAA >60 10/18/2017 1959   GFRAA >60 10/18/2017 1959      Latest Ref Rng & Units 10/18/2017    7:59 PM 12/23/2016    9:17 AM 12/22/2016    8:12 PM  BMP  Glucose 70 - 99 mg/dL 893  841    BUN 8 - 23 mg/dL 10  6    Creatinine 9.55 - 1.00 mg/dL 9.27  9.32  9.35   Sodium 135 - 145 mmol/L 141  140    Potassium 3.5 - 5.1 mmol/L 3.5  3.4    Chloride 98 - 111 mmol/L 105  109    CO2 22 - 32 mmol/L 24  23    Calcium  8.9 - 10.3 mg/dL 8.9  9.1         Component Value Date/Time   WBC 12.5 (H) 10/18/2017 1959   RBC 4.50 10/18/2017 1959   HGB 10.3 (L) 10/18/2017 1959   HCT 34.4 (L) 10/18/2017 1959   PLT 269 10/18/2017 1959   MCV 76.4 (L) 10/18/2017 1959   MCV 78.5 (A) 04/21/2011 1432   MCH 22.9 (L) 10/18/2017 1959   MCHC 29.9 (L) 10/18/2017 1959   RDW 15.9 (H) 10/18/2017 1959   LYMPHSABS 2.0 10/18/2017 1959   MONOABS 1.1 (H) 10/18/2017 1959   EOSABS 0.2 10/18/2017 1959   BASOSABS 0.1 10/18/2017 1959      Parts of this note may have been dictated using voice recognition software. There may be variances in spelling and vocabulary which are unintentional. Not all errors are proofread. Please notify the dino if any discrepancies are noted or if the meaning of any statement is not clear.

## 2023-02-23 ENCOUNTER — Other Ambulatory Visit: Payer: Self-pay | Admitting: "Endocrinology

## 2023-02-23 DIAGNOSIS — E890 Postprocedural hypothyroidism: Secondary | ICD-10-CM

## 2023-02-23 LAB — TSH(REFL): TSH: 0.02 m[IU]/L — ABNORMAL LOW (ref 0.40–4.50)

## 2023-02-23 LAB — REFLEX TIQ

## 2023-02-23 NOTE — Progress Notes (Signed)
 Outpatient Endocrinology Note Terri Birmingham, MD  02/23/23   [name] 1945/10/01 [id number]  Referring Provider: No ref. provider found Primary Care Provider: Lorren Greig PARAS, NP Subjective  No chief complaint on file.   Assessment & Plan  Diagnoses and all orders for this visit:  Postablative hypothyroidism -     TSH(Reflex)    [name] is currently taking levothyroxine  137 mcg po every day: TSH 0.03. Patient is currently biochemically hypothyroid.  Educated on thyroid  axis.  Recommend the following: Take levothyroxine  137 mcg po every day except Sunday take half a pill.  Advised to take levothyroxine  first thing in the morning on empty stomach and wait at least 30 minutes to 1 hour before eating or drinking anything or taking any other medications. Space out levothyroxine  by 4 hours from any acid reflux medication/fibrate/iron/calcium /multivitamin. Advised to take birth control pills and nutritional supplements in the evening. Repeat lab before next visit or sooner if symptoms of hyperthyroidism or hypothyroidism develop.  Notify us  immediately in case of significant weight gain or loss. Counseled on compliance and follow up needs.  I have reviewed current medications, nurse's notes, allergies, vital signs, past medical and surgical history, family medical history, and social history for this encounter. Counseled patient on symptoms, examination findings, lab findings, imaging results, treatment decisions and monitoring and prognosis. The patient understood the recommendations and agrees with the treatment plan. All questions regarding treatment plan were fully answered.   No follow-ups on file.   Terri Birmingham, MD  02/23/23   I have reviewed current medications, nurse's notes, allergies, vital signs, past medical and surgical history, family medical history, and social history for this encounter. Counseled patient on symptoms, examination findings, lab  findings, imaging results, treatment decisions and monitoring and prognosis. The patient understood the recommendations and agrees with the treatment plan. All questions regarding treatment plan were fully answered.   History of Present Illness [name] is a 78 y.o. year old female who presents to our clinic with hypothyroidism diagnosed in 2017.    History obtained on patient's initial visit: Patient had started losing weight in August 2017 and apparently had lost a total of about 30 pounds last year despite an increased appetite. She was started on methimazole  20 mg twice a day on her discharge on 03/31/16. She had her first I-131 treatment 06/15/2017 with 28.9 mCi. She had her second I-131 treatment with 28.7 mCi on 12/04/2017.  On levothyroxine  137 mcg po every day   Symptoms suggestive of HYPOTHYROIDISM:  fatigue No weight gain No cold intolerance  Yes constipation  No  Symptoms suggestive of HYPERTHYROIDISM:  weight loss  No heat intolerance No hyperdefecation  No palpitations  No  Compressive symptoms:  dysphagia  No dysphonia  No positional dyspnea (especially with simultaneous arms elevation)  No  Smokes  No On biotin  No Personal history of head/neck surgery/irradiation  No  Physical Exam  There were no vitals taken for this visit. Constitutional: well developed, well nourished Head: normocephalic, atraumatic, no exophthalmos Eyes: sclera anicteric, no redness Neck: no thyromegaly, no thyroid  tenderness; no nodules palpated Lungs: normal respiratory effort Neurology: alert and oriented, no fine hand tremor Skin: dry, no appreciable rashes Musculoskeletal: no appreciable defects Psychiatric: normal mood and affect  Allergies Allergies  Allergen Reactions   Aspirin Shortness Of Breath    Current Medications Patient's Medications  New Prescriptions   No medications on file  Previous Medications   ACETAMINOPHEN  (TYLENOL ) 325 MG TABLET  Take 325-650 mg  by mouth every 6 (six) hours as needed for mild pain or headache.   ALBUTEROL  (PROVENTIL ) (2.5 MG/3ML) 0.083% NEBULIZER SOLUTION    Take 3 mLs (2.5 mg total) by nebulization every 6 (six) hours as needed. asthma   AMLODIPINE (NORVASC) 5 MG TABLET    Take by mouth.   BUDESONIDE -FORMOTEROL  (SYMBICORT ) 160-4.5 MCG/ACT INHALER    Inhale 2 puffs into the lungs 2 (two) times daily.   FAMOTIDINE  (PEPCID ) 20 MG TABLET    One at bedtime   FERROUS GLUCONATE (FERGON) 324 MG TABLET    Take by mouth.   FLUTICASONE  (FLONASE ) 50 MCG/ACT NASAL SPRAY    Place 2 sprays into the nose daily.   IBANDRONATE (BONIVA) 150 MG TABLET    Take by mouth.   LEVOTHYROXINE  (LEVOXYL ) 137 MCG TABLET    Take 1 tablet (137 mcg total) by mouth daily before breakfast.   LEVOTHYROXINE  (SYNTHROID ) 125 MCG TABLET    Take 125 mcg by mouth daily.   OMEPRAZOLE (PRILOSEC) 10 MG CAPSULE    Take 10 mg by mouth daily.   OMEPRAZOLE (PRILOSEC) 20 MG CAPSULE    Take 1 capsule by mouth daily.   OXYBUTYNIN  (DITROPAN  XL) 15 MG 24 HR TABLET    Take 1 tablet by mouth daily.   OXYBUTYNIN  (DITROPAN -XL) 5 MG 24 HR TABLET    Take 1 tablet (5 mg total) by mouth at bedtime.   PANTOPRAZOLE  (PROTONIX ) 40 MG TABLET    Take 1 tablet (40 mg total) by mouth daily before breakfast.   VITAMIN D, ERGOCALCIFEROL, (DRISDOL) 1.25 MG (50000 UNIT) CAPS CAPSULE    Take by mouth.  Modified Medications   No medications on file  Discontinued Medications   No medications on file    Past Medical History Past Medical History:  Diagnosis Date   Asthma    GERD (gastroesophageal reflux disease)    Heart murmur    Pneumonia 03/28/2016    Past Surgical History Past Surgical History:  Procedure Laterality Date   CATARACT EXTRACTION W/ INTRAOCULAR LENS  IMPLANT, BILATERAL  2011 &2009   TONSILLECTOMY     TUBAL LIGATION      Family History family history includes Asthma in her sister; Emphysema in her father; Hypertension in her mother and sister.  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[employer]  Tobacco Use   Smoking status: Never   Smokeless tobacco: Never  Substance and Sexual Activity   Alcohol use: Yes    Comment: maybe 1-2 times annually   Drug use: No   Sexual activity: Not Currently    Birth control/protection: None  Other Topics Concern   Not on file  Social History Narrative   Not on file   Social Drivers of Health   Financial Resource Strain: Low Risk  (01/22/2023)   Overall Financial Resource Strain (CARDIA)    Difficulty of Paying Living Expenses: Not hard at all  Food Insecurity: No Food Insecurity (01/22/2023)   Hunger Vital Sign    Worried About Running Out of Food in the Last Year: Never true    Ran Out of Food in the Last Year: Never true  Transportation Needs: No Transportation Needs (01/22/2023)  PRAPARE - Administrator, Civil Service (Medical): No    Lack of Transportation (Non-Medical): No  Physical Activity: Sufficiently Active (01/22/2023)   Exercise Vital Sign    Days of Exercise per Week: 7 days    Minutes of Exercise per Session: 40 min  Stress: No Stress Concern Present (01/22/2023)   [employer] of Occupational Health - Occupational Stress Questionnaire    Feeling of Stress : Not at all  Social Connections: Moderately Integrated (01/22/2023)   Social Connection and Isolation Panel [NHANES]    Frequency of Communication with Friends and Family: More than three times a week    Frequency of Social Gatherings with Friends and Family: More than three times a week    Attends Religious Services: More than 4 times per year    Active Member of Clubs or Organizations: Yes    Attends Banker Meetings: More than 4 times per year    Marital Status:  Divorced  Recent Concern: Social Connections - Moderately Isolated (01/22/2023)   Social Connection and Isolation Panel [NHANES]    Frequency of Communication with Friends and Family: More than three times a week    Frequency of Social Gatherings with Friends and Family: More than three times a week    Attends Religious Services: More than 4 times per year    Active Member of Clubs or Organizations: No    Attends Banker Meetings: Never    Marital Status: Divorced  Catering Manager Violence: Not At Risk (01/22/2023)   Humiliation, Afraid, Rape, and Kick questionnaire    Fear of Current or Ex-Partner: No    Emotionally Abused: No    Physically Abused: No    Sexually Abused: No    Laboratory Investigations Lab Results  Component Value Date   TSH 10.86 (H) 12/07/2022   TSH 10.400 (H) 10/19/2022   TSH 0.15 (L) 09/18/2018   FREET4 0.63 12/07/2022   FREET4 0.72 09/18/2018   FREET4 1.32 07/12/2018     No results found for: TSI   No components found for: TRAB   Lab Results  Component Value Date   CHOL 107 08/04/2016   Lab Results  Component Value Date   HDL 45.00 08/04/2016   Lab Results  Component Value Date   LDLCALC 47 08/04/2016   Lab Results  Component Value Date   TRIG 76.0 08/04/2016   Lab Results  Component Value Date   CHOLHDL 2 08/04/2016   Lab Results  Component Value Date   CREATININE 0.72 10/18/2017   Lab Results  Component Value Date   GFR 107.24 08/04/2016      Component Value Date/Time   NA 141 10/18/2017 1959   K 3.5 10/18/2017 1959   CL 105 10/18/2017 1959   CO2 24 10/18/2017 1959   GLUCOSE 106 (H) 10/18/2017 1959   BUN 10 10/18/2017 1959   CREATININE 0.72 10/18/2017 1959   CALCIUM  8.9 10/18/2017 1959   PROT 7.3 12/22/2016 1525   ALBUMIN 3.0 (L) 12/22/2016 1525   AST 22 12/22/2016 1525   ALT 10 10/08/2017 1226   ALKPHOS 151 (H) 12/22/2016 1525   BILITOT 0.8 12/22/2016 1525   GFRNONAA >60 10/18/2017 1959   GFRAA >60  10/18/2017 1959      Latest Ref Rng & Units 10/18/2017    7:59 PM 12/23/2016    9:17 AM 12/22/2016    8:12 PM  BMP  Glucose 70 - 99 mg/dL 893  841    BUN  8 - 23 mg/dL 10  6    Creatinine 9.55 - 1.00 mg/dL 9.27  9.32  9.35   Sodium 135 - 145 mmol/L 141  140    Potassium 3.5 - 5.1 mmol/L 3.5  3.4    Chloride 98 - 111 mmol/L 105  109    CO2 22 - 32 mmol/L 24  23    Calcium  8.9 - 10.3 mg/dL 8.9  9.1         Component Value Date/Time   WBC 12.5 (H) 10/18/2017 1959   RBC 4.50 10/18/2017 1959   HGB 10.3 (L) 10/18/2017 1959   HCT 34.4 (L) 10/18/2017 1959   PLT 269 10/18/2017 1959   MCV 76.4 (L) 10/18/2017 1959   MCV 78.5 (A) 04/21/2011 1432   MCH 22.9 (L) 10/18/2017 1959   MCHC 29.9 (L) 10/18/2017 1959   RDW 15.9 (H) 10/18/2017 1959   LYMPHSABS 2.0 10/18/2017 1959   MONOABS 1.1 (H) 10/18/2017 1959   EOSABS 0.2 10/18/2017 1959   BASOSABS 0.1 10/18/2017 1959      Parts of this note may have been dictated using voice recognition software. There may be variances in spelling and vocabulary which are unintentional. Not all errors are proofread. Please notify the dino if any discrepancies are noted or if the meaning of any statement is not clear.

## 2023-03-30 ENCOUNTER — Encounter: Payer: Self-pay | Admitting: Internal Medicine

## 2023-03-30 ENCOUNTER — Ambulatory Visit: Payer: Medicare (Managed Care) | Attending: Internal Medicine | Admitting: Internal Medicine

## 2023-03-30 VITALS — BP 186/83 | HR 57 | Ht 69.0 in | Wt 180.0 lb

## 2023-03-30 DIAGNOSIS — E876 Hypokalemia: Secondary | ICD-10-CM

## 2023-03-30 DIAGNOSIS — I700 Atherosclerosis of aorta: Secondary | ICD-10-CM

## 2023-03-30 DIAGNOSIS — R030 Elevated blood-pressure reading, without diagnosis of hypertension: Secondary | ICD-10-CM

## 2023-03-30 DIAGNOSIS — I160 Hypertensive urgency: Secondary | ICD-10-CM

## 2023-03-30 DIAGNOSIS — R011 Cardiac murmur, unspecified: Secondary | ICD-10-CM

## 2023-03-30 MED ORDER — CHLORTHALIDONE 25 MG PO TABS: 25.0000 mg | ORAL_TABLET | Freq: Every day | ORAL | 3 refills | Status: AC

## 2023-03-30 NOTE — Progress Notes (Signed)
 Cardiology Office Note:  .    Date:  03/30/2023  ID:  [name], DOB 23-May-1945, MRN [id number] PCP: Lorren Greig PARAS, NP  [facility] Providers Cardiologist:  None     CC:  Heart murmur Consulted for the evaluation of HTN urgency at the behest of Ms. Lorren NP   History of Present Illness: .    [name] is a 78 y.o. female  with hyperthyroidism and aortic atherosclerosis who presents with hypertensive urgency. She was referred by Greig Mages, a nurse practitioner, for evaluation of high blood pressure.  She has a history of severely uncontrolled hypertension and is not currently on any antihypertensive medications. Home blood pressure readings have varied, with systolic values typically between 130 and 150, but reaching as high as 154. Today, her blood pressure is higher than usual. She was previously on Norvasc (amlodipine) during a period of thyroid  issues, but it was discontinued after her thyroid  condition improved. No chest pain, chest pressure, breathing issues, swelling, or fluid retention.  She has a history of hyperthyroidism, which was previously treated, leading to the discontinuation of amlodipine. Her thyroid  levels were recently adjusted as she was 'doing too good.'  She has a history of aortic atherosclerosis identified in a 2018 CT scan, with no known coronary artery calcifications. No chest pain, chest pressure, or other related symptoms.  She has a family history of cardiovascular disease, with her mother having high blood pressure and cardiovascular issues at the age of 79.  She is active and aims to walk 1,500 steps a day, although she acknowledges not always reaching this goal. She stays indoors often due to feeling cold.  No chest pain, chest pressure, chest tightness, chest stinging, arm pain, jaw pain, breathing issues, swelling, or fluid retention. Reports knee pain.   Relevant histories: .  Social -[place]iginally from [place] - Mother had a heart  attack at age 64 - Mother had high blood pressure ROS: As per HPI.   Studies Reviewed: .   Cardiac Studies & Procedures      ECHOCARDIOGRAM  ECHOCARDIOGRAM COMPLETE 12/23/2016  Narrative *[place]* *[facility]* [address] [place], [place] [address] [phone]  ------------------------------------------------------------------- Transthoracic Echocardiography  (Report amended )  Patient:    [name], [name] MR #:       [id number] Study Date: 12/23/2016 Gender:     F Age:        80 Height:     175.3 cm Weight:     69.2 kg BSA:        1.84 m^2 Pt. Status: Room:       [address]    Rizwan, Saima [id number]     Mpstjw, Saima [id number] BARTON Earley Saucer [id number] ADMITTING    Franky Redia SAILOR PERFORMING   [facility], Inpatient SONOGRAPHER  Chelsea Androw  cc:  ------------------------------------------------------------------- LV EF: 50% -   55%  ------------------------------------------------------------------- Indications:      Syncope 780.2.  ------------------------------------------------------------------- History:   PMH:  Elevated BP. Nonspecific abnormal echocardiogram.  ------------------------------------------------------------------- Study Conclusions  - Left ventricle: The cavity size was normal. Wall thickness was normal. Systolic function was normal. The estimated ejection fraction was in the range of 50% to 55%. Wall motion was normal; there were no regional wall motion abnormalities. Doppler parameters are consistent with abnormal left ventricular relaxation (grade 1 diastolic dysfunction). - Aortic valve: There was trivial regurgitation. - Mitral valve: There was mild regurgitation. - Left atrium: The atrium  was moderately dilated.  Impressions:  - Normal LV systolic function; mild LVH; mild diastolic dysfunction; elevated LVOT velocity of 2.5 m/s suggests mild AS but visually valve opens well; trace  AI; mild MR; moderate LAE.  ------------------------------------------------------------------- Study data:  Comparison was made to the study of 03/30/2016.  Study status:  Routine.  Procedure:  The patient reported no pain pre or post test. Transthoracic echocardiography. Image quality was adequate.  Study completion:  There were no complications. Transthoracic echocardiography.  M-mode, complete 2D, spectral Doppler, and color Doppler.  Birthdate:  Patient birthdate: 07-21-1945.  Age:  Patient is 78 yr old.  Sex:  Gender: female. BMI: 22.5 kg/m^2.  Blood pressure:     146/66  Patient status: Inpatient.  Study date:  Study date: 12/23/2016. Study time: 10:45 AM.  Location:  Bedside.  -------------------------------------------------------------------  ------------------------------------------------------------------- Left ventricle:  The cavity size was normal. Wall thickness was normal. Systolic function was normal. The estimated ejection fraction was in the range of 50% to 55%. Wall motion was normal; there were no regional wall motion abnormalities. Doppler parameters are consistent with abnormal left ventricular relaxation (grade 1 diastolic dysfunction).  ------------------------------------------------------------------- Aortic valve:   Trileaflet; mildly thickened leaflets. Mobility was not restricted.  Doppler:  Transvalvular velocity was increased. There was no stenosis. There was trivial regurgitation.    VTI ratio of LVOT to aortic valve: 0.63. Indexed valve area (VTI): 0.88 cm^2/m^2. Peak velocity ratio of LVOT to aortic valve: 0.64. Indexed valve area (Vmax): 0.89 cm^2/m^2. Mean velocity ratio of LVOT to aortic valve: 0.61. Indexed valve area (Vmean): 0.85 cm^2/m^2.    Mean gradient (S): 13 mm Hg. Peak gradient (S): 26 mm Hg.  ------------------------------------------------------------------- Aorta:  Aortic root: The aortic root was normal in  size.  ------------------------------------------------------------------- Mitral valve:   Mildly thickened leaflets . Mobility was not restricted.  Doppler:  Transvalvular velocity was within the normal range. There was no evidence for stenosis. There was mild regurgitation.    Peak gradient (D): 3 mm Hg.  ------------------------------------------------------------------- Left atrium:  The atrium was moderately dilated.  ------------------------------------------------------------------- Right ventricle:  The cavity size was normal. Systolic function was normal.  ------------------------------------------------------------------- Pulmonic valve:    Doppler:  Transvalvular velocity was within the normal range. There was no evidence for stenosis. There was trivial regurgitation.  ------------------------------------------------------------------- Tricuspid valve:   Structurally normal valve.    Doppler: Transvalvular velocity was within the normal range. There was trivial regurgitation.  ------------------------------------------------------------------- Right atrium:  The atrium was normal in size.  ------------------------------------------------------------------- Pericardium:  There was no pericardial effusion.  ------------------------------------------------------------------- Systemic veins: Inferior vena cava: The vessel was mildly dilated.  ------------------------------------------------------------------- Measurements  Left ventricle                           Value          Reference LV ID, ED, PLAX chordal                  49.7  mm       43 - 52 LV ID, ES, PLAX chordal                  35.7  mm       23 - 38 LV fx shortening, PLAX chordal   (L)     28    %        >=29 LV PW thickness, ED  9.81  mm       ---------- IVS/LV PW ratio, ED                      0.92           <=1.3 Stroke volume, 2D                        84    ml        ---------- Stroke volume/bsa, 2D                    46    ml/m^2   ---------- LV ejection fraction, 1-p A4C            59    %        ---------- LV end-diastolic volume, 2-p             125   ml       ---------- LV end-systolic volume, 2-p              56    ml       ---------- LV ejection fraction, 2-p                55    %        ---------- Stroke volume, 2-p                       69    ml       ---------- LV end-diastolic volume/bsa, 2-p         68    ml/m^2   ---------- LV end-systolic volume/bsa, 2-p          31    ml/m^2   ---------- Stroke volume/bsa, 2-p                   37.5  ml/m^2   ---------- LV e&', lateral                           8.7   cm/s     ---------- LV E/e&', lateral                         9.75           ---------- LV e&', medial                            8.05  cm/s     ---------- LV E/e&', medial                          10.53          ---------- LV e&', average                           8.38  cm/s     ---------- LV E/e&', average                         10.13          ----------  Ventricular septum                       Value  Reference IVS thickness, ED                        8.98  mm       ----------  LVOT                                     Value          Reference LVOT ID, S                               18    mm       ---------- LVOT area                                2.54  cm^2     ---------- LVOT peak velocity, S                    162   cm/s     ---------- LVOT mean velocity, S                    102   cm/s     ---------- LVOT VTI, S                              33.2  cm       ---------- LVOT peak gradient, S                    10    mm Hg    ----------  Aortic valve                             Value          Reference Aortic valve peak velocity, S            253   cm/s     ---------- Aortic valve mean velocity, S            166   cm/s     ---------- Aortic valve VTI, S                      52.3  cm       ---------- Aortic mean gradient, S                   13    mm Hg    ---------- Aortic peak gradient, S                  26    mm Hg    ---------- VTI ratio, LVOT/AV                       0.63           ---------- Aortic valve area/bsa, VTI               0.88  cm^2/m^2 ---------- Velocity ratio, peak, LVOT/AV            0.64           ---------- Aortic valve area/bsa, peak  0.89  cm^2/m^2 ---------- velocity Velocity ratio, mean, LVOT/AV            0.61           ---------- Aortic valve area/bsa, mean              0.85  cm^2/m^2 ---------- velocity  Aorta                                    Value          Reference Aortic root ID, ED                       28    mm       ----------  Left atrium                              Value          Reference LA ID, A-P, ES                           41    mm       ---------- LA ID/bsa, A-P                   (H)     2.23  cm/m^2   <=2.2 LA volume, S                             89.5  ml       ---------- LA volume/bsa, S                         48.7  ml/m^2   ---------- LA volume, ES, 1-p A4C                   77.1  ml       ---------- LA volume/bsa, ES, 1-p A4C               42    ml/m^2   ---------- LA volume, ES, 1-p A2C                   93.5  ml       ---------- LA volume/bsa, ES, 1-p A2C               50.9  ml/m^2   ----------  Mitral valve                             Value          Reference Mitral E-wave peak velocity              84.8  cm/s     ---------- Mitral A-wave peak velocity              108   cm/s     ---------- Mitral deceleration time                 190   ms       150 - 230 Mitral peak gradient, D                  3  mm Hg    ---------- Mitral E/A ratio, peak                   0.8            ----------  Right atrium                             Value          Reference RA ID, S-I, ES, A4C              (H)     53.4  mm       34 - 49 RA area, ES, A4C                         18.1  cm^2     8.3 - 19.5 RA volume, ES, A/L                       52    ml        ---------- RA volume/bsa, ES, A/L                   28.3  ml/m^2   ----------  Systemic veins                           Value          Reference Estimated CVP                            15    mm Hg    ----------  Right ventricle                          Value          Reference TAPSE                                    34.6  mm       ---------- RV s&', lateral, S                        18    cm/s     ----------  Legend: (L)  and  (H)  mark values outside specified reference range.  ------------------------------------------------------------------- Delman Redell Shallow 2018-11-03T14:42:09             DIAGNOSTIC EKG: Infralateral T wave inversions (2023-03-30) Echocardiogram: No significant hypertrophy (2017-01-02)   Physical Exam:    VS:  BP (!) 186/83 (BP Location: Right Arm)   Pulse (!) 57   Ht 5' 9 (1.753 m)   Wt 180 lb (81.6 kg)   SpO2 97%   BMI 26.58 kg/m    Wt Readings from Last 3 Encounters:  03/30/23 180 lb (81.6 kg)  02/22/23 181 lb (82.1 kg)  01/22/23 184 lb 6.4 oz (83.6 kg)    Gen: no distress   Neck: No JVD,  Cardiac: No Rubs or Gallops, Systolic Murmur, regular bradycardia, +2 radial pulses Respiratory: Clear to auscultation bilaterally, normal effort, normal  respiratory rate GI: Soft, nontender, non-distended  MS: No  edema;  moves all extremities Integument: Skin feels warm Neuro:  At time of evaluation,  alert and oriented to person/place/time/situation  Psych: Normal affect, patient feels well   ASSESSMENT AND PLAN: .    Hypertensive Urgency Severely uncontrolled hypertension with current blood pressure readings significantly higher than her usual range of 130-150 mmHg systolic. EKG shows new infralateral T wave inversions, possibly related to hypertrophy and secondary repolarization versus ischemia. Asymptomatic for chest pain, dyspnea, or other ischemic symptoms. Discussed risks of untreated hypertension, including stroke, heart attack, and  kidney damage. Explained that chlorthalidone  will help lower blood pressure by increasing urine output, which may cause more frequent urination. Emphasized the importance of home blood pressure monitoring and bringing her cuff to the next appointment for validation. - Prescribe chlorthalidone  25 mg - Order BMP in 1 week; she has a hx of hypokalemia NOS so sooner f/u is appropriate - Schedule follow-up in March with Pharm D and bring blood pressure cuff for validation - Schedule follow-up in summer to reassess blood pressure control - can use norvasc, she had no issues with this in the past   Systolic Heart Murmur Systolic heart murmur likely secondary to hypertension. No significant hypertrophy noted in previous echocardiogram from 2018. Explained that the murmur is likely due to hypertension and that an echocardiogram will help assess any changes in heart structure or function. - Order echocardiogram  Aortic Atherosclerosis Aortic atherosclerosis without known coronary artery calcifications from a 2018 CT scan. No current symptoms suggestive of coronary artery disease. Discussed the importance of lipid management to prevent progression of atherosclerosis. - Order lipid panel with BMP  Hyperthyroidism Hyperthyroidism managed with endocrinologist. Recent adjustments in medication due to improved thyroid  function. No current symptoms of hyperthyroidism. - Continue current management with endocrinologist/ PCP  General Health Maintenance Generally active but does not walk as much as recommended. Encouraged to increase physical activity to improve overall cardiovascular health. - Encourage increased physical activity, aiming for more than 1500 steps per day  Follow-up - Follow-up in March with Pharm D - Follow-up in summer with out team to reassess blood pressure control.  Stanly Leavens, MD FASE [facility] Cardiologist [facility]  [address] [place], [address] [place], [place]  [address] [phone]  10:29 AM

## 2023-03-30 NOTE — Patient Instructions (Signed)
 Medication Instructions:  Your physician has recommended you make the following change in your medication:  START: Chlorthalidone  25 mg by mouth once daily; please monitor your Blood Pressure and keep a log  *If you need a refill on your cardiac medications before your next appointment, please call your pharmacy*   Lab Work: IN 1 WEEK: BMP, Fasting lipid panel (nothing to eat or drink 12 hours prior except water)  If you have labs (blood work) drawn today and your tests are completely normal, you will receive your results only by: MyChart Message (if you have MyChart) OR A paper copy in the mail If you have any lab test that is abnormal or we need to change your treatment, we will call you to review the results.   Testing/Procedures: Your physician has requested that you have an echocardiogram. Echocardiography is a painless test that uses sound waves to create images of your heart. It provides your doctor with information about the size and shape of your heart and how well your heart's chambers and valves are working. This procedure takes approximately one hour. There are no restrictions for this procedure. Please do NOT wear cologne, perfume, aftershave, or lotions (deodorant is allowed). Please arrive 15 minutes prior to your appointment time.  Please note: We ask at that you not bring children with you during ultrasound (echo/ vascular) testing. Due to room size and safety concerns, children are not allowed in the ultrasound rooms during exams. Our front office staff cannot provide observation of children in our lobby area while testing is being conducted. An adult accompanying a patient to their appointment will only be allowed in the ultrasound room at the discretion of the ultrasound technician under special circumstances. We apologize for any inconvenience.  MARCH: Your physician has requested that you meet with the Pharmacy Clinic to manage Blood Pressure.  Please bring your Blood  Pressure cuff to this appointment.    Follow-Up: At [facility], you and your health needs are our priority.  As part of our continuing mission to provide you with exceptional heart care, we have created designated Provider Care Teams.  These Care Teams include your primary Cardiologist (physician) and Advanced Practice Providers (APPs -  Physician Assistants and Nurse Practitioners) who all work together to provide you with the care you need, when you need it.    Your next appointment:   3 month(s)  Provider:   Orren Fabry, PA-C, Dayna Dunn, PA-C, Jackee Alberts, NP, Olivia Pavy, PA-C, Rosaline Bane, NP, Glendia Ferrier, PA-C, or Artist Pouch, PA-C

## 2023-04-17 ENCOUNTER — Other Ambulatory Visit: Payer: Self-pay | Admitting: Family

## 2023-04-17 DIAGNOSIS — R32 Unspecified urinary incontinence: Secondary | ICD-10-CM

## 2023-04-19 ENCOUNTER — Ambulatory Visit (HOSPITAL_COMMUNITY): Payer: Medicare (Managed Care) | Attending: Internal Medicine

## 2023-04-19 DIAGNOSIS — R011 Cardiac murmur, unspecified: Secondary | ICD-10-CM | POA: Insufficient documentation

## 2023-04-19 DIAGNOSIS — I700 Atherosclerosis of aorta: Secondary | ICD-10-CM | POA: Diagnosis present

## 2023-04-19 DIAGNOSIS — I160 Hypertensive urgency: Secondary | ICD-10-CM | POA: Diagnosis present

## 2023-04-19 LAB — ECHOCARDIOGRAM COMPLETE
Area-P 1/2: 2.66 cm2
P 1/2 time: 858 ms
S' Lateral: 3.3 cm

## 2023-04-20 ENCOUNTER — Encounter: Payer: Self-pay | Admitting: Internal Medicine

## 2023-04-20 DIAGNOSIS — I517 Cardiomegaly: Secondary | ICD-10-CM

## 2023-04-27 NOTE — Telephone Encounter (Signed)
-----   Message from [facility] A Izora Ribas sent at 04/20/2023  1:16 PM EST ----- Results: Apical hypertrophy (up to 15 mm ) BP Has improved Plan: CMR I do not have the results of her BMP (s/p chlorthalidone start)  Christell Constant, MD

## 2023-04-27 NOTE — Telephone Encounter (Signed)
 The patient has been notified of the result and verbalized understanding.  All questions (if any) were answered. Macie Burows, RN 04/27/2023 9:28 AM   CMR ordered.  Pt reports has had a lot of appointments and just slipped her mind to have labs drawn.  Will have labs drawn.  Pt request that I put instructions for Cardiac MRI on my chart and will call if has questions or concerns.  Order for CBC placed and released for draw.

## 2023-05-02 ENCOUNTER — Encounter (HOSPITAL_COMMUNITY): Payer: Self-pay

## 2023-05-10 ENCOUNTER — Other Ambulatory Visit: Payer: Self-pay

## 2023-05-17 ENCOUNTER — Other Ambulatory Visit: Payer: Medicare (Managed Care)

## 2023-05-18 LAB — TSH(REFL): TSH: 8.61 m[IU]/L — ABNORMAL HIGH (ref 0.40–4.50)

## 2023-05-18 LAB — REFLEX TIQ

## 2023-05-21 ENCOUNTER — Ambulatory Visit: Payer: Medicare (Managed Care) | Attending: Internal Medicine | Admitting: Pharmacist

## 2023-05-21 VITALS — BP 134/68 | HR 69

## 2023-05-21 DIAGNOSIS — R030 Elevated blood-pressure reading, without diagnosis of hypertension: Secondary | ICD-10-CM

## 2023-05-21 NOTE — Assessment & Plan Note (Signed)
 Assessment: Blood pressure slightly above goal of less than 130/80 today in clinic Blood pressure is significantly better than the last visit Patient compliant with chlorthalidone 25 mg daily BMP as needed still Limited physical activity The majority of her home blood pressure readings were at goal.  With some 1 30-1 50 systolic  Plan: Continue chlorthalidone 25 mg daily Check BMP today Follow-up as needed

## 2023-05-21 NOTE — Progress Notes (Signed)
 Patient ID: [name]                 DOB: 1945/04/21                      MRN: [id number]      HPI: Geneal Huebert is a 78 y.o. female referred by Dr. Izora Ribas to HTN clinic. PMH is significant for HTN, hyperthyroidism, systolic heart murmur, aortic atherosclerosis.  Patient previously on amlodipine during a period of thyroid issues but discontinued once thyroid condition was under control.  She was seen by Dr. Izora Ribas 03/30/23.  Blood pressure was extremely elevated that day 186/83.  Patient reports blood pressure typically 130-150 systolic.  Chlorthalidone 25 mg daily was started.  She was supposed to go to the lab 1 week after starting but has yet to do so.  She was reminded of this on 04/27/2023.   Patient presents today for follow-up.  She reports compliance with chlorthalidone.  She brings in a list of her home blood pressure readings but does not bring in her home cuff.  Most readings are in the 120s systolic with a few in the 1 teens and a handful in the 130s to 150s.  She reports only 1 episode of dizziness that lasted just a few minutes.  Denies any headaches blurred vision, palpitations or chest pain.  Her recent TSH is elevated again.  I did ask her to check what dose of levothyroxine she has been taking since it looks like both the 125 and 137 were dispensed in November.  Follow-up with endocrinology on this.  She uses a stepper machine for about 10 or 15 minutes every other day.  Her son and grandson want her to go to the track to walk.  Patient concerned about her knee.  Encouraged her to walk for 5 or 15 minutes a few times a day.  Current HTN meds: chlorthalidone 25mg  daily Previously tried: Amlodipine (stopped due to better control of her thyroid) BP goal: <130/80  Family History:  Family History  Problem Relation Age of Onset   Asthma Sister    Emphysema Father        was a smoker   Hypertension Mother    Hypertension Sister    Thyroid disease Neg Hx     Diabetes Neg Hx     Social History: no ETOH, no tobacco, no drugs  Diet: coffee 3 times a week, ginger ale ~ 1 per day, water   Exercise:  Steppers 15 min- every other day   Home BP readings:  120's-150's- mainly 120's  Wt Readings from Last 3 Encounters:  03/30/23 180 lb (81.6 kg)  02/22/23 181 lb (82.1 kg)  01/22/23 184 lb 6.4 oz (83.6 kg)   BP Readings from Last 3 Encounters:  05/21/23 134/68  03/30/23 (!) 186/83  02/22/23 136/84   Pulse Readings from Last 3 Encounters:  05/21/23 69  03/30/23 (!) 57  02/22/23 89    Renal function: CrCl cannot be calculated (Patient's most recent lab result is older than the maximum 21 days allowed.).  Past Medical History:  Diagnosis Date   Asthma    GERD (gastroesophageal reflux disease)    Heart murmur    Pneumonia 03/28/2016    Current Outpatient Medications on File Prior to Visit  Medication Sig Dispense Refill   chlorthalidone (HYGROTON) 25 MG tablet Take 1 tablet (25 mg total) by mouth daily. 90 tablet 3   famotidine (PEPCID) 20 MG tablet One  at bedtime (Patient taking differently: Take 20 mg by mouth at bedtime. One at bedtime)     levothyroxine (LEVOXYL) 137 MCG tablet Take 1 tablet (137 mcg total) by mouth daily before breakfast. 90 tablet 1   oxybutynin (DITROPAN-XL) 5 MG 24 hr tablet Take 1 tablet (5 mg total) by mouth at bedtime. 90 tablet 0   pantoprazole (PROTONIX) 40 MG tablet Take 1 tablet (40 mg total) by mouth daily before breakfast. 90 tablet 4   acetaminophen (TYLENOL) 325 MG tablet Take 325-650 mg by mouth every 6 (six) hours as needed for mild pain or headache.     albuterol (PROVENTIL) (2.5 MG/3ML) 0.083% nebulizer solution Take 3 mLs (2.5 mg total) by nebulization every 6 (six) hours as needed. asthma (Patient not taking: Reported on 05/21/2023) 75 mL 0   budesonide-formoterol (SYMBICORT) 160-4.5 MCG/ACT inhaler Inhale 2 puffs into the lungs 2 (two) times daily. (Patient not taking: Reported on 05/21/2023) 3  Inhaler 3   ferrous gluconate (FERGON) 324 MG tablet Take by mouth. (Patient not taking: Reported on 05/21/2023)     Vitamin D, Ergocalciferol, (DRISDOL) 1.25 MG (50000 UNIT) CAPS capsule Take by mouth every 7 (seven) days. (Patient not taking: Reported on 05/21/2023)     No current facility-administered medications on file prior to visit.    Allergies  Allergen Reactions   Aspirin Shortness Of Breath    Blood pressure 134/68, pulse 69.   Assessment/Plan:     1. Hypertension -  Elevated BP without diagnosis of hypertension Assessment: Blood pressure slightly above goal of less than 130/80 today in clinic Blood pressure is significantly better than the last visit Patient compliant with chlorthalidone 25 mg daily BMP as needed still Limited physical activity The majority of her home blood pressure readings were at goal.  With some 1 30-1 50 systolic  Plan: Continue chlorthalidone 25 mg daily Check BMP today Follow-up as needed   Thank you  Olene Floss, Pharm.D, BCACP, CPP [facility] of [facility] [address], [place], [place] [address]  Phone: [phone]; Fax: [phone]

## 2023-05-21 NOTE — Patient Instructions (Signed)
 Your blood pressure goal is < 130/38mmHg   Please go for lab work downstairs today at [employer] chlorthalidone 25mg  daily  Important lifestyle changes to control high blood pressure  Intervention  Effect on the BP   Weight loss Weight loss is one of the most effective lifestyle changes for controlling blood pressure. If you're overweight or obese, losing even a small amount of weight can help reduce blood pressure.    Blood pressure can decrease by 1 millimeter of mercury (mmHg) with each kilogram (about 2.2 pounds) of weight lost.   Exercise regularly As a general goal, aim for 30 minutes of moderate physical activity every day.    Regular physical activity can lower blood pressure by 5 - 8 mmHg.   Eat a healthy diet Eat a diet rich in whole grains, fruits, vegetables, lean meat, and low-fat dairy products. Limit processed foods, saturated fat, and sweets.    A heart-healthy diet can lower high blood pressure by 10 mmHg.   Reduce salt (sodium) in your diet Aim for 000mg  of sodium each day. Avoid deli meats, canned food, and frozen microwave meals which are high in sodium.     Limiting sodium can reduce blood pressure by 5 mmHg.   Limit alcohol One drink equals 12 ounces of beer, 5 ounces of wine, or 1.5 ounces of 80-proof liquor.    Limiting alcohol to < 1 drink a day for women or < 2 drinks a day for men can help lower blood pressure by about 4 mmHg.   To check your pressure at home you will need to:   Sit up in a chair, with feet flat on the floor and back supported. Do not cross your ankles or legs. Rest your left arm so that the cuff is about heart level. If the cuff goes on your upper arm, then just relax your arm on the table, arm of the chair, or your lap. If you have a wrist cuff, hold your wrist against your chest at heart level. Place the cuff snugly around your arm, about 1 inch above the crease of your elbow. The cords should be inside the groove of  your elbow.  Sit quietly, with the cuff in place, for about 5 minutes. Then press the power button to start a reading. Do not talk or move while the reading is taking place.  Record your readings on a sheet of paper. Although most cuffs have a memory, it is often easier to see a pattern developing when the numbers are all in front of you.  You can repeat the reading after 1-3 minutes if it is recommended.   Make sure your bladder is empty and you have not had caffeine or tobacco within the last 30 minutes   Always bring your blood pressure log with you to your appointments. If you have not brought your monitor in to be double checked for accuracy, please bring it to your next appointment.   You can find a list of validated (accurate) blood pressure cuffs at: [email or web address]  \

## 2023-05-22 LAB — LIPID PANEL
Chol/HDL Ratio: 2.9 ratio (ref 0.0–4.4)
Cholesterol, Total: 182 mg/dL (ref 100–199)
HDL: 62 mg/dL (ref >39)
LDL Chol Calc (NIH): 99 mg/dL (ref 0–99)
Triglycerides: 117 mg/dL (ref 0–149)
VLDL Cholesterol Cal: 21 mg/dL (ref 5–40)

## 2023-05-22 LAB — BASIC METABOLIC PANEL WITH GFR
BUN/Creatinine Ratio: 18 (ref 12–28)
BUN: 21 mg/dL (ref 8–27)
CO2: 26 mmol/L (ref 20–29)
Calcium: 9.6 mg/dL (ref 8.7–10.3)
Chloride: 103 mmol/L (ref 96–106)
Creatinine, Ser: 1.18 mg/dL — ABNORMAL HIGH (ref 0.57–1.00)
Glucose: 70 mg/dL (ref 70–99)
Potassium: 4.1 mmol/L (ref 3.5–5.2)
Sodium: 142 mmol/L (ref 134–144)
eGFR: 47 mL/min/{1.73_m2} — ABNORMAL LOW (ref >59)

## 2023-05-22 LAB — CBC
Hematocrit: 35.5 % (ref 34.0–46.6)
Hemoglobin: 11 g/dL — ABNORMAL LOW (ref 11.1–15.9)
MCH: 24.6 pg — ABNORMAL LOW (ref 26.6–33.0)
MCHC: 31 g/dL — ABNORMAL LOW (ref 31.5–35.7)
MCV: 79 fL (ref 79–97)
Platelets: 220 10*3/uL (ref 150–450)
RBC: 4.47 x10E6/uL (ref 3.77–5.28)
RDW: 14.9 % (ref 11.7–15.4)
WBC: 5.4 10*3/uL (ref 3.4–10.8)

## 2023-05-23 ENCOUNTER — Ambulatory Visit: Payer: Medicare (Managed Care) | Admitting: "Endocrinology

## 2023-05-23 ENCOUNTER — Encounter: Payer: Self-pay | Admitting: "Endocrinology

## 2023-05-23 VITALS — BP 140/80 | HR 60 | Ht 69.0 in | Wt 180.0 lb

## 2023-05-23 DIAGNOSIS — E890 Postprocedural hypothyroidism: Secondary | ICD-10-CM | POA: Diagnosis not present

## 2023-05-23 MED ORDER — LEVOTHYROXINE SODIUM 125 MCG PO TABS: ORAL_TABLET | ORAL | 1 refills | Status: AC

## 2023-05-23 NOTE — Addendum Note (Signed)
 Addended by: Altamese Sam Rayburn on: 05/23/2023 10:04 AM   Modules accepted: Orders

## 2023-05-23 NOTE — Progress Notes (Addendum)
 Outpatient Endocrinology Note Altamese Macon, MD  05/23/23   [name] [birth year] [id number]  Referring Provider: Rema Fendt, NP Primary Care Provider: Rema Fendt, NP Subjective  No chief complaint on file.   Assessment & Plan  Diagnoses and all orders for this visit:  Postablative hypothyroidism -     TSH(Reflex)  Other orders -     levothyroxine (SYNTHROID) 125 MCG tablet; Take 1 pill everyday except Sunday take 1.5 pills     [name] is currently taking Levothyroxine 137 mcg po every day except Sunday take half a pill led to TSH of 8.6 with fatigue.  Patient is currently biochemically hypothyroid (levothyroxine 137 mcg po every day led to TSH 0.03)  Educated on thyroid axis.  Recommend the following: Take levothyroxine 125 mcg 1 pill everyday except Sunday take 1.5 pills. Advised to take levothyroxine first thing in the morning on empty stomach and wait at least 30 minutes to 1 hour before eating or drinking anything or taking any other medications. Space out levothyroxine by 4 hours from any acid reflux medication/fibrate/iron/calcium/multivitamin. Advised to take birth control pills and nutritional supplements in the evening. Repeat lab before next visit or sooner if symptoms of hyperthyroidism or hypothyroidism develop.  Notify us immediately in case of significant weight gain or loss. Counseled on compliance and follow up needs.  History of RAI ablation in 2017 Thyroid nodule noted on exam Ordered follow up thyroid U/S   I have reviewed current medications, nurse's notes, allergies, vital signs, past medical and surgical history, family medical history, and social history for this encounter. Counseled patient on symptoms, examination findings, lab findings, imaging results, treatment decisions and monitoring and prognosis. The patient understood the recommendations and agrees with the treatment plan. All questions regarding treatment plan were  fully answered.  Return in about 4 months (around 09/22/2023).   Altamese Troy, MD  05/23/23   I have reviewed current medications, nurse's notes, allergies, vital signs, past medical and surgical history, family medical history, and social history for this encounter. Counseled patient on symptoms, examination findings, lab findings, imaging results, treatment decisions and monitoring and prognosis. The patient understood the recommendations and agrees with the treatment plan. All questions regarding treatment plan were fully answered.   History of Present Illness [name] is a 78 y.o. year old female who presents to our clinic with hypothyroidism diagnosed in 2017.    History obtained on patient's initial visit: Patient had started losing weight in August 2017 and apparently had lost a total of about 30 pounds last year despite an increased appetite. She was started on methimazole 20 mg twice a day on her discharge on 03/31/16. She had her first I-131 treatment 06/15/2017 with 28.9 mCi. She had her second I-131 treatment with 28.7 mCi on 12/04/2017.  On levothyroxine 137 mcg po every day   Symptoms suggestive of HYPOTHYROIDISM:  fatigue Yes weight gain No cold intolerance  Yes, hand constipation  No  Symptoms suggestive of HYPERTHYROIDISM:  weight loss  No heat intolerance No hyperdefecation  No palpitations  No  Compressive symptoms:  dysphagia  No dysphonia  No positional dyspnea (especially with simultaneous arms elevation)  No  Smokes  No On biotin  No Personal history of head/neck surgery/irradiation  No  Physical Exam  BP (!) 140/80   Pulse 60   Ht 5\' 9"  (1.753 m)   Wt 180 lb (81.6 kg)   SpO2 95%   BMI 26.58 kg/m  Constitutional:  well developed, well nourished Head: normocephalic, atraumatic, no exophthalmos Eyes: sclera anicteric, no redness Neck: no thyromegaly, no thyroid tenderness; no nodules palpated Lungs: normal respiratory effort Neurology: alert  and oriented, no fine hand tremor Skin: dry, no appreciable rashes Musculoskeletal: no appreciable defects Psychiatric: normal mood and affect  Allergies Allergies  Allergen Reactions   Aspirin Shortness Of Breath    Current Medications Patient's Medications  New Prescriptions   LEVOTHYROXINE (SYNTHROID) 125 MCG TABLET    Take 1 pill everyday except Sunday take 1.5 pills  Previous Medications   ACETAMINOPHEN (TYLENOL) 325 MG TABLET    Take 325-650 mg by mouth every 6 (six) hours as needed for mild pain or headache.   ALBUTEROL (PROVENTIL) (2.5 MG/3ML) 0.083% NEBULIZER SOLUTION    Take 3 mLs (2.5 mg total) by nebulization every 6 (six) hours as needed. asthma   BUDESONIDE-FORMOTEROL (SYMBICORT) 160-4.5 MCG/ACT INHALER    Inhale 2 puffs into the lungs 2 (two) times daily.   CHLORTHALIDONE (HYGROTON) 25 MG TABLET    Take 1 tablet (25 mg total) by mouth daily.   FAMOTIDINE (PEPCID) 20 MG TABLET    One at bedtime   FERROUS GLUCONATE (FERGON) 324 MG TABLET    Take by mouth.   OXYBUTYNIN (DITROPAN-XL) 5 MG 24 HR TABLET    Take 1 tablet (5 mg total) by mouth at bedtime.   PANTOPRAZOLE (PROTONIX) 40 MG TABLET    Take 1 tablet (40 mg total) by mouth daily before breakfast.   VITAMIN D, ERGOCALCIFEROL, (DRISDOL) 1.25 MG (50000 UNIT) CAPS CAPSULE    Take by mouth every 7 (seven) days.  Modified Medications   No medications on file  Discontinued Medications   LEVOTHYROXINE (LEVOXYL) 137 MCG TABLET    Take 1 tablet (137 mcg total) by mouth daily before breakfast.    Past Medical History Past Medical History:  Diagnosis Date   Asthma    GERD (gastroesophageal reflux disease)    Heart murmur    Pneumonia 03/28/2016    Past Surgical History Past Surgical History:  Procedure Laterality Date   CATARACT EXTRACTION W/ INTRAOCULAR LENS  IMPLANT, BILATERAL  2011 &2009   TONSILLECTOMY     TUBAL LIGATION      Family History family history includes Asthma in her sister; Emphysema in her  father; Hypertension in her mother and sister.  Social History Social History   Socioeconomic History   Marital status: Widowed    Spouse name: Not on file   Number of children: 1   Years of education: Not on file   Highest education level: Not on file  Occupational History   Occupation: Scientist, forensic: [employer]  Tobacco Use   Smoking status: Never   Smokeless tobacco: Never  Substance and Sexual Activity   Alcohol use: Yes    Comment: maybe 1-2 times annually   Drug use: No   Sexual activity: Not Currently    Birth control/protection: None  Other Topics Concern   Not on file  Social History Narrative   Not on file   Social Drivers of Health   Financial Resource Strain: Low Risk  (01/22/2023)   Overall Financial Resource Strain (CARDIA)    Difficulty of Paying Living Expenses: Not hard at all  Food Insecurity: No Food Insecurity (01/22/2023)   Hunger Vital Sign    Worried About Running Out of Food in the Last Year: Never true    Ran Out of Food in the Last Year: Never true  Transportation Needs: No Transportation Needs (01/22/2023)   PRAPARE - Administrator, Civil Service (Medical): No    Lack of Transportation (Non-Medical): No  Physical Activity: Sufficiently Active (01/22/2023)   Exercise Vital Sign    Days of Exercise per Week: 7 days    Minutes of Exercise per Session: 40 min  Stress: No Stress Concern Present (01/22/2023)   [employer] of Occupational Health - Occupational Stress Questionnaire    Feeling of Stress : Not at all  Social Connections: Moderately Integrated (01/22/2023)   Social Connection and Isolation Panel [NHANES]    Frequency of Communication with Friends and Family: More than three times a week    Frequency of Social Gatherings with Friends and Family: More than three times a week    Attends Religious Services: More than 4 times per year    Active Member of Clubs or Organizations: Yes    Attends Banker  Meetings: More than 4 times per year    Marital Status: Divorced  Recent Concern: Social Connections - Moderately Isolated (01/22/2023)   Social Connection and Isolation Panel [NHANES]    Frequency of Communication with Friends and Family: More than three times a week    Frequency of Social Gatherings with Friends and Family: More than three times a week    Attends Religious Services: More than 4 times per year    Active Member of Clubs or Organizations: No    Attends Banker Meetings: Never    Marital Status: Divorced  Catering manager Violence: Not At Risk (01/22/2023)   Humiliation, Afraid, Rape, and Kick questionnaire    Fear of Current or Ex-Partner: No    Emotionally Abused: No    Physically Abused: No    Sexually Abused: No    Laboratory Investigations Lab Results  Component Value Date   TSH 10.86 (H) 12/07/2022   TSH 10.400 (H) 10/19/2022   TSH 0.15 (L) 09/18/2018   FREET4 0.63 12/07/2022   FREET4 0.72 09/18/2018   FREET4 1.32 07/12/2018     No results found for: "TSI"   No components found for: "TRAB"   Lab Results  Component Value Date   CHOL 182 05/21/2023   Lab Results  Component Value Date   HDL 62 05/21/2023   Lab Results  Component Value Date   LDLCALC 99 05/21/2023   Lab Results  Component Value Date   TRIG 117 05/21/2023   Lab Results  Component Value Date   CHOLHDL 2.9 05/21/2023   Lab Results  Component Value Date   CREATININE 1.18 (H) 05/21/2023   Lab Results  Component Value Date   GFR 107.24 08/04/2016      Component Value Date/Time   NA 142 05/21/2023 1055   K 4.1 05/21/2023 1055   CL 103 05/21/2023 1055   CO2 26 05/21/2023 1055   GLUCOSE 70 05/21/2023 1055   GLUCOSE 106 (H) 10/18/2017 1959   BUN 21 05/21/2023 1055   CREATININE 1.18 (H) 05/21/2023 1055   CALCIUM 9.6 05/21/2023 1055   PROT 7.3 12/22/2016 1525   ALBUMIN 3.0 (L) 12/22/2016 1525   AST 22 12/22/2016 1525   ALT 10 10/08/2017 1226   ALKPHOS 151 (H)  12/22/2016 1525   BILITOT 0.8 12/22/2016 1525   GFRNONAA >60 10/18/2017 1959   GFRAA >60 10/18/2017 1959      Latest Ref Rng & Units 05/21/2023   10:55 AM 10/18/2017    7:59 PM 12/23/2016    9:17 AM  BMP  Glucose 70 - 99 mg/dL 70  782  956   BUN 8 - 27 mg/dL 21  10  6    Creatinine 0.57 - 1.00 mg/dL 2.13  0.86  5.78   BUN/Creat Ratio 12 - 28 18     Sodium 134 - 144 mmol/L 142  141  140   Potassium 3.5 - 5.2 mmol/L 4.1  3.5  3.4   Chloride 96 - 106 mmol/L 103  105  109   CO2 20 - 29 mmol/L 26  24  23    Calcium 8.7 - 10.3 mg/dL 9.6  8.9  9.1        Component Value Date/Time   WBC 5.4 05/21/2023 1055   WBC 12.5 (H) 10/18/2017 1959   RBC 4.47 05/21/2023 1055   RBC 4.50 10/18/2017 1959   HGB 11.0 (L) 05/21/2023 1055   HCT 35.5 05/21/2023 1055   PLT 220 05/21/2023 1055   MCV 79 05/21/2023 1055   MCH 24.6 (L) 05/21/2023 1055   MCH 22.9 (L) 10/18/2017 1959   MCHC 31.0 (L) 05/21/2023 1055   MCHC 29.9 (L) 10/18/2017 1959   RDW 14.9 05/21/2023 1055   LYMPHSABS 2.0 10/18/2017 1959   MONOABS 1.1 (H) 10/18/2017 1959   EOSABS 0.2 10/18/2017 1959   BASOSABS 0.1 10/18/2017 1959      Parts of this note may have been dictated using voice recognition software. There may be variances in spelling and vocabulary which are unintentional. Not all errors are proofread. Please notify the Thereasa Parkin if any discrepancies are noted or if the meaning of any statement is not clear.

## 2023-05-24 DIAGNOSIS — I700 Atherosclerosis of aorta: Secondary | ICD-10-CM

## 2023-05-24 MED ORDER — ROSUVASTATIN CALCIUM 5 MG PO TABS: 5.0000 mg | ORAL_TABLET | Freq: Every day | ORAL | 3 refills | Status: AC

## 2023-05-24 NOTE — Telephone Encounter (Signed)
 The patient has been notified of the result and verbalized understanding.  All questions (if any) were answered. Macie Burows, RN 05/24/2023 9:28 AM   Lab orders placed and released for future draw.  Pt reports BP range 120-140's mostly 120's.

## 2023-05-24 NOTE — Telephone Encounter (Signed)
-----   Message from Christell Constant sent at 05/23/2023  2:14 PM EDT ----- LDL is above goal given aortic atherosclerosis.  Recommend rosuvastatin 5 mg PO daily and labs in three months for MI prevention. ----- Message ----- From: Interface, [facility] Lab Results In Sent: 05/22/2023   2:36 AM EDT To: Christell Constant, MD

## 2023-05-25 ENCOUNTER — Ambulatory Visit (HOSPITAL_COMMUNITY)
Admission: RE | Admit: 2023-05-25 | Discharge: 2023-05-25 | Disposition: A | Discharge status: Home / Self Care | Payer: Medicare (Managed Care) | Source: Ambulatory Visit | Attending: "Endocrinology | Admitting: "Endocrinology

## 2023-05-25 DIAGNOSIS — E890 Postprocedural hypothyroidism: Secondary | ICD-10-CM | POA: Insufficient documentation

## 2023-05-28 ENCOUNTER — Encounter (HOSPITAL_COMMUNITY): Payer: Self-pay

## 2023-05-30 ENCOUNTER — Ambulatory Visit (HOSPITAL_COMMUNITY)
Admission: RE | Admit: 2023-05-30 | Discharge: 2023-05-30 | Disposition: A | Discharge status: Home / Self Care | Payer: Medicare (Managed Care) | Source: Ambulatory Visit | Attending: Internal Medicine | Admitting: Internal Medicine

## 2023-05-30 ENCOUNTER — Other Ambulatory Visit: Payer: Self-pay | Admitting: Internal Medicine

## 2023-05-30 DIAGNOSIS — I517 Cardiomegaly: Secondary | ICD-10-CM | POA: Insufficient documentation

## 2023-05-30 MED ORDER — GADOBUTROL 1 MMOL/ML IV SOLN
10.0000 mL | Freq: Once | INTRAVENOUS | Status: AC | PRN
  Administered 2023-05-30: 10 mL via INTRAVENOUS

## 2023-06-27 NOTE — Progress Notes (Signed)
 Cardiology Office Note    Patient Name: [name] Date of Encounter: 06/27/2023  Primary Care Provider:  Senaida Dama, NP Primary Cardiologist:  None Primary Electrophysiologist: None   Past Medical History    Past Medical History:  Diagnosis Date   Asthma    GERD (gastroesophageal reflux disease)    Heart murmur    Pneumonia 03/28/2016    History of Present Illness  [name] is a 78 y.o. female with a PMH of primary HTN, aortic atherosclerosis, family history of CAD, hypothyroidism who presents today for 48-month follow-up.  Ms. Seydel was seen initially by Dr. Annabelle Barrack on 03/30/2023 for evaluation of hypertensive urgency.  She reported home blood pressure readings between 130 and 150 systolically.  She has a history of hypothyroidism that was previously treated and was on amlodipine that was discontinued following treatment.  She completed a CT of the chest in 2018 to rule out DVT that showed aortic atherosclerosis.  She is active with a goal of obtaining 1500 steps a day.  EKG during visit showed new inferior serial lateral T wave inversion possibly related to hypertrophy.  She was started on chlorthalidone  25 mg advised to follow-up with Pharm.D. March for further evaluation.  She was noted to have a systolic murmur on examination.  She underwent a 2D echo that showed EF of 60-65% with RWMA and grade 2 DD and apical lateral hypokinesis.  She underwent further evaluation with cardiac MRI to rule out hypertrophic cardiomyopathy that was negative for hypertrophic cardiomyopathy.  She was seen by Modesta Andrea Pharm.D. on 05/21/2023 he reported compliance with medications.  She brought in her home cuff that showed readings in the 120s with a few 130s and 150s noted.  She was noted to have some elevation to TSH and advised to review and follow-up with her endocrinology.  She was advised to continue to monitor and continued on chlorthalidone  25 mg daily.  Patient denies chest pain,  palpitations, dyspnea, PND, orthopnea, nausea, vomiting, dizziness, syncope, edema, weight gain, or early satiety.   Discussed the use of AI scribe software for clinical note transcription with the patient, who gave verbal consent to proceed.  History of Present Illness    ***Notes: -Last ischemic evaluation:  Review of Systems  Please see the history of present illness.    All other systems reviewed and are otherwise negative except as noted above.  Physical Exam    Wt Readings from Last 3 Encounters:  05/23/23 180 lb (81.6 kg)  03/30/23 180 lb (81.6 kg)  02/22/23 181 lb (82.1 kg)   YQ:MVHQI were no vitals filed for this visit.,There is no height or weight on file to calculate BMI. GEN: Well nourished, well developed in no acute distress Neck: No JVD; No carotid bruits Pulmonary: Clear to auscultation without rales, wheezing or rhonchi  Cardiovascular: Normal rate. Regular rhythm. Normal S1. Normal S2.   Murmurs: There is no murmur.  ABDOMEN: Soft, non-tender, non-distended EXTREMITIES:  No edema; No deformity   EKG/LABS/ Recent Cardiac Studies   ECG personally reviewed by me today - ***  Risk Assessment/Calculations:   {Does this patient have ATRIAL FIBRILLATION?:[phone]}      Lab Results  Component Value Date   WBC 5.4 05/21/2023   HGB 11.0 (L) 05/21/2023   HCT 35.5 05/21/2023   MCV 79 05/21/2023   PLT 220 05/21/2023   Lab Results  Component Value Date   CREATININE 1.18 (H) 05/21/2023   BUN 21 05/21/2023   NA 142  05/21/2023   K 4.1 05/21/2023   CL 103 05/21/2023   CO2 26 05/21/2023   Lab Results  Component Value Date   CHOL 182 05/21/2023   HDL 62 05/21/2023   LDLCALC 99 05/21/2023   TRIG 117 05/21/2023   CHOLHDL 2.9 05/21/2023    Lab Results  Component Value Date   HGBA1C 6.2 08/04/2016   Assessment & Plan    1.  Primary hypertension  2.  Aortic atherosclerosis  3.  History of hyperthyroidism  4.***      Disposition: Follow-up  with None or APP in *** months {Are you ordering a CV Procedure (e.g. stress test, cath, DCCV, TEE, etc)?   Press F2        :[id number]}   Signed, Francene Ing, Retha Cast, NP 06/27/2023, 11:43 AM [facility]

## 2023-06-28 ENCOUNTER — Ambulatory Visit: Payer: Medicare (Managed Care) | Attending: Nurse Practitioner | Admitting: Nurse Practitioner

## 2023-06-28 ENCOUNTER — Encounter: Payer: Self-pay | Admitting: Nurse Practitioner

## 2023-06-28 VITALS — BP 132/74 | HR 50 | Ht 69.0 in | Wt 187.4 lb

## 2023-06-28 DIAGNOSIS — I700 Atherosclerosis of aorta: Secondary | ICD-10-CM | POA: Diagnosis not present

## 2023-06-28 DIAGNOSIS — R011 Cardiac murmur, unspecified: Secondary | ICD-10-CM

## 2023-06-28 DIAGNOSIS — E0590 Thyrotoxicosis, unspecified without thyrotoxic crisis or storm: Secondary | ICD-10-CM | POA: Diagnosis not present

## 2023-06-28 DIAGNOSIS — I10 Essential (primary) hypertension: Secondary | ICD-10-CM | POA: Diagnosis not present

## 2023-06-28 NOTE — Patient Instructions (Signed)
 Medication Instructions:  Your physician recommends that you continue on your current medications as directed. Please refer to the Current Medication list given to you today.  *If you need a refill on your cardiac medications before your next appointment, please call your pharmacy*  Follow-Up: At [facility], you and your health needs are our priority.  As part of our continuing mission to provide you with exceptional heart care, our providers are all part of one team.  This team includes your primary Cardiologist (physician) and Advanced Practice Providers or APPs (Physician Assistants and Nurse Practitioners) who all work together to provide you with the care you need, when you need it.  Your next appointment:   6 month(s)  The format for your next appointment:   In Person  Provider:   Jann Melody, MD or Charles Connor, NP   We recommend signing up for the patient portal called "MyChart".  Sign up information is provided on this After Visit Summary.  MyChart is used to connect with patients for Virtual Visits (Telemedicine).  Patients are able to view lab/test results, encounter notes, upcoming appointments, etc.  Non-urgent messages can be sent to your provider as well.   To learn more about what you can do with MyChart, go to [email or web address].   Other Instructions Please check your blood pressure at home over the next 1-2 weeks, then send us  a list of all your readings either by MyChart message or call our office.  HOW TO TAKE YOUR BLOOD PRESSURE Rest 5 minutes before taking your blood pressure. Don't  smoke or drink caffeinated beverages for at least 30 minutes before. Take your blood pressure before (not after) you eat. Sit comfortably with your back supported and both feet on the floor ( don't cross your legs). Elevate your arm to heart level on a table or a desk. Use the proper sized cuff.  It should fit smoothly and snugly around your bare upper arm.  There should be enough room to slip a fingertip under the cuff.  The bottom edge of the cuff should be 1 inch above the crease of the elbow.  Please monitor your blood pressure once daily 2 hours after your am medication. If you blood pressure consistently remains above 140 (systolic) top number or over 90 ( diastolic) bottom number X 3 days consecutively.  Please call our office at [phone] or send Mychart message.

## 2023-08-14 ENCOUNTER — Telehealth: Payer: Self-pay

## 2023-08-14 ENCOUNTER — Ambulatory Visit: Payer: Self-pay | Admitting: "Endocrinology

## 2023-08-14 NOTE — Telephone Encounter (Signed)
 Lvm for pt to call back.

## 2023-09-24 ENCOUNTER — Ambulatory Visit: Payer: Medicare (Managed Care) | Admitting: "Endocrinology
